# Patient Record
Sex: Male | Born: 1985 | Race: Black or African American | Hispanic: No | Marital: Married | Smoking: Former smoker
Health system: Southern US, Community
[De-identification: ages and names within clinical notes are randomized; demographics above are authoritative.]

## PROBLEM LIST (undated history)

## (undated) DIAGNOSIS — Z87898 Personal history of other specified conditions: Secondary | ICD-10-CM

## (undated) DIAGNOSIS — J309 Allergic rhinitis, unspecified: Secondary | ICD-10-CM

## (undated) DIAGNOSIS — Z9289 Personal history of other medical treatment: Secondary | ICD-10-CM

## (undated) DIAGNOSIS — I10 Essential (primary) hypertension: Secondary | ICD-10-CM

## (undated) DIAGNOSIS — E669 Obesity, unspecified: Secondary | ICD-10-CM

## (undated) DIAGNOSIS — Z8249 Family history of ischemic heart disease and other diseases of the circulatory system: Secondary | ICD-10-CM

## (undated) DIAGNOSIS — E790 Hyperuricemia without signs of inflammatory arthritis and tophaceous disease: Secondary | ICD-10-CM

## (undated) DIAGNOSIS — T7840XA Allergy, unspecified, initial encounter: Secondary | ICD-10-CM

## (undated) HISTORY — DX: Family history of ischemic heart disease and other diseases of the circulatory system: Z82.49

## (undated) HISTORY — PX: OTHER SURGICAL HISTORY: SHX169

## (undated) HISTORY — DX: Allergic rhinitis, unspecified: J30.9

## (undated) HISTORY — DX: Allergy, unspecified, initial encounter: T78.40XA

## (undated) HISTORY — DX: Essential (primary) hypertension: I10

## (undated) HISTORY — DX: Obesity, unspecified: E66.9

## (undated) HISTORY — DX: Personal history of other medical treatment: Z92.89

## (undated) HISTORY — DX: Personal history of other specified conditions: Z87.898

## (undated) HISTORY — DX: Hyperuricemia without signs of inflammatory arthritis and tophaceous disease: E79.0

---
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
## 2006-06-19 ENCOUNTER — Emergency Department (HOSPITAL_COMMUNITY): Admission: EM | Admit: 2006-06-19 | Discharge: 2006-06-19 | Payer: Self-pay | Admitting: Family Medicine

## 2008-01-04 ENCOUNTER — Emergency Department: Payer: Self-pay | Admitting: Emergency Medicine

## 2008-01-05 ENCOUNTER — Emergency Department: Payer: Self-pay | Admitting: Emergency Medicine

## 2008-03-18 ENCOUNTER — Emergency Department (HOSPITAL_COMMUNITY): Admission: EM | Admit: 2008-03-18 | Discharge: 2008-03-18 | Payer: Self-pay | Admitting: Emergency Medicine

## 2008-08-08 ENCOUNTER — Emergency Department (HOSPITAL_COMMUNITY): Admission: EM | Admit: 2008-08-08 | Discharge: 2008-08-08 | Payer: Self-pay | Admitting: Emergency Medicine

## 2009-07-10 ENCOUNTER — Emergency Department (HOSPITAL_COMMUNITY): Admission: EM | Admit: 2009-07-10 | Discharge: 2009-07-10 | Payer: Self-pay | Admitting: Family Medicine

## 2009-09-23 ENCOUNTER — Emergency Department (HOSPITAL_COMMUNITY): Admission: EM | Admit: 2009-09-23 | Discharge: 2009-09-23 | Payer: Self-pay | Admitting: Family Medicine

## 2010-02-27 ENCOUNTER — Inpatient Hospital Stay (INDEPENDENT_AMBULATORY_CARE_PROVIDER_SITE_OTHER)
Admission: RE | Admit: 2010-02-27 | Discharge: 2010-02-27 | Disposition: A | Discharge status: Home / Self Care | Payer: Self-pay | Source: Ambulatory Visit | Attending: Family Medicine | Admitting: Family Medicine

## 2010-02-27 ENCOUNTER — Ambulatory Visit (HOSPITAL_COMMUNITY)
Admission: RE | Admit: 2010-02-27 | Discharge: 2010-02-27 | Disposition: A | Discharge status: Home / Self Care | Payer: Self-pay | Source: Ambulatory Visit | Attending: Family Medicine | Admitting: Family Medicine

## 2010-02-27 DIAGNOSIS — M25539 Pain in unspecified wrist: Secondary | ICD-10-CM | POA: Insufficient documentation

## 2010-02-27 DIAGNOSIS — M799 Soft tissue disorder, unspecified: Secondary | ICD-10-CM

## 2011-02-06 ENCOUNTER — Emergency Department (HOSPITAL_COMMUNITY)
Admission: EM | Admit: 2011-02-06 | Discharge: 2011-02-06 | Disposition: A | Discharge status: Home / Self Care | Payer: Self-pay | Source: Home / Self Care | Attending: Family Medicine | Admitting: Family Medicine

## 2011-02-06 ENCOUNTER — Encounter (HOSPITAL_COMMUNITY): Payer: Self-pay

## 2011-02-06 ENCOUNTER — Emergency Department (INDEPENDENT_AMBULATORY_CARE_PROVIDER_SITE_OTHER): Payer: Self-pay

## 2011-02-06 DIAGNOSIS — IMO0002 Reserved for concepts with insufficient information to code with codable children: Secondary | ICD-10-CM

## 2011-02-06 DIAGNOSIS — S46912A Strain of unspecified muscle, fascia and tendon at shoulder and upper arm level, left arm, initial encounter: Secondary | ICD-10-CM

## 2011-02-06 DIAGNOSIS — M25519 Pain in unspecified shoulder: Secondary | ICD-10-CM

## 2011-02-06 MED ORDER — IBUPROFEN 800 MG PO TABS: 800.0000 mg | ORAL_TABLET | Freq: Three times a day (TID) | ORAL | Status: AC

## 2011-02-06 NOTE — ED Notes (Signed)
Pt states he lifted his 28 year old son above his head with his lt arm on Saturday and heard a pop.  C/o pain in lt shoulder that extends up into lt side of his neck.  States he can raise his lt arm but it is painful.

## 2011-02-06 NOTE — ED Provider Notes (Signed)
History     CSN: [id number]  Arrival date & time 02/06/11  1519   First MD Initiated Contact with Patient 02/06/11 1642      Chief Complaint  Patient presents with  . Shoulder Injury    (Consider location/radiation/quality/duration/timing/severity/associated sxs/prior treatment) Patient is a 26 y.o. male presenting with shoulder injury. The history is provided by the patient.  Shoulder Injury This is a new problem. The current episode started 2 days ago. The problem has not changed since onset.Exacerbated by: raisng  left arm up above shoulder. The symptoms are relieved by nothing.    History reviewed. No pertinent past medical history.  History reviewed. No pertinent past surgical history.  No family history on file.  History  Substance Use Topics  . Smoking status: Current Everyday Smoker  . Smokeless tobacco: Not on file  . Alcohol Use: Yes     occasional      Review of Systems  Constitutional: Negative.   Musculoskeletal: Positive for myalgias.  Skin: Negative.     Allergies  Review of patient's allergies indicates no known allergies.  Home Medications   Current Outpatient Rx  Name Route Sig Dispense Refill  . IBUPROFEN 800 MG PO TABS Oral Take 1 tablet (800 mg total) by mouth 3 (three) times daily. 30 tablet 0    BP 131/79  Pulse 74  Temp(Src) 98.4 F (36.9 C) (Oral)  Resp 16  SpO2 98%  Physical Exam  Nursing note and vitals reviewed. Constitutional: He is oriented to person, place, and time. He appears well-developed and well-nourished.  Musculoskeletal: He exhibits tenderness.       Arms: Neurological: He is alert and oriented to person, place, and time.  Skin: Skin is warm and dry.  Psychiatric: He has a normal mood and affect.    ED Course  Procedures (including critical care time)  Labs Reviewed - No data to display Dg Shoulder Left  02/06/2011  *RADIOLOGY REPORT*  Clinical Data: History of injury of shoulder.  Pain and soreness.   LEFT SHOULDER - 2+ VIEW  Comparison: None.  Findings: Alignment is normal.  Joint spaces are preserved.  No fracture or dislocation is evident.  No soft tissue lesions are seen.  No evidence of calcific tendonitis or calcific bursitis.  IMPRESSION: No shoulder abnormality evident.  Original Report Authenticated By: Crawford Givens, M.D.     1. Strain of shoulder, left       MDM  X-rays reviewed and report per radiologist.         Barkley Bruns, MD 02/06/11 [phone]

## 2011-06-06 ENCOUNTER — Encounter (HOSPITAL_COMMUNITY): Payer: Self-pay

## 2011-06-06 ENCOUNTER — Emergency Department (HOSPITAL_COMMUNITY)
Admission: EM | Admit: 2011-06-06 | Discharge: 2011-06-06 | Disposition: A | Discharge status: Home / Self Care | Payer: Self-pay | Attending: Emergency Medicine | Admitting: Emergency Medicine

## 2011-06-06 DIAGNOSIS — M25579 Pain in unspecified ankle and joints of unspecified foot: Secondary | ICD-10-CM | POA: Insufficient documentation

## 2011-06-06 MED ORDER — IBUPROFEN 800 MG PO TABS
800.0000 mg | ORAL_TABLET | Freq: Once | ORAL | Status: AC
  Administered 2011-06-06: 800 mg via ORAL
  Filled 2011-06-06: qty 1

## 2011-06-06 MED ORDER — ACETAMINOPHEN-CODEINE #3 300-30 MG PO TABS
1.0000 | ORAL_TABLET | Freq: Once | ORAL | Status: AC
Start: 2011-06-06 — End: 2011-06-06
  Administered 2011-06-06: 1 via ORAL
  Filled 2011-06-06: qty 1

## 2011-06-06 MED ORDER — ACETAMINOPHEN-CODEINE #3 300-30 MG PO TABS: 1.0000 | ORAL_TABLET | Freq: Four times a day (QID) | ORAL | Status: AC | PRN

## 2011-06-06 MED ORDER — IBUPROFEN 800 MG PO TABS: 800.0000 mg | ORAL_TABLET | Freq: Three times a day (TID) | ORAL | Status: AC

## 2011-06-06 NOTE — Progress Notes (Signed)
Orthopedic Tech Progress Note Patient Details:  Victor Jensen [birth year] [id number]  Other Ortho Devices Type of Ortho Device: Crutches   [name] 06/06/2011, 10:26 AM

## 2011-06-06 NOTE — Discharge Instructions (Signed)
Wear ankle brace for comfort and stabilization of ankle. Alternate between tylenol #3 and ibuprofen for pain but do not drive or operate machinery with Tylenol #3 use. Ice and elevate for additional pain relief. Followup with orthopedic specialist for recurrent ankle pain is very important for further evaluation and management however return to emergency department for any emergent changing or worsening of symptoms.  Ankle Pain Ankle pain is a common symptom. The bones, cartilage, tendons, and muscles of the ankle joint perform a lot of work each day. The ankle joint holds your body weight and allows you to move around. Ankle pain can occur on either side or back of 1 or both ankles. Ankle pain may be sharp and burning or dull and aching. There may be tenderness, stiffness, redness, or warmth around the ankle. The pain occurs more often when a person walks or puts pressure on the ankle. CAUSES  There are many reasons ankle pain can develop. It is important to work with your caregiver to identify the cause since many conditions can impact the bones, cartilage, muscles, and tendons. Causes for ankle pain include:  Injury, including a break (fracture), sprain, or strain often due to a fall, sports, or a high-impact activity.   Swelling (inflammation) of a tendon (tendonitis).   Achilles tendon rupture.   Ankle instability after repeated sprains and strains.   Poor foot alignment.   Pressure on a nerve (tarsal tunnel syndrome).   Arthritis in the ankle or the lining of the ankle.   Crystal formation in the ankle (gout or pseudogout).  DIAGNOSIS  A diagnosis is based on your medical history, your symptoms, results of your physical exam, and results of diagnostic tests. Diagnostic tests may include X-ray exams or a computerized magnetic scan (magnetic resonance imaging, MRI). TREATMENT  Treatment will depend on the cause of your ankle pain and may include:  Keeping pressure off the ankle and  limiting activities.   Using crutches or other walking support (a cane or brace).   Using rest, ice, compression, and elevation.   Participating in physical therapy or home exercises.   Wearing shoe inserts or special shoes.   Losing weight.   Taking medications to reduce pain or swelling or receiving an injection.   Undergoing surgery.  HOME CARE INSTRUCTIONS   Only take over-the-counter or prescription medicines for pain, discomfort, or fever as directed by your caregiver.   Put ice on the injured area.   Put ice in a plastic bag.   Place a towel between your skin and the bag.   Leave the ice on for 15 to 20 minutes at a time, 3 to 4 times a day.   Keep your leg raised (elevated) when possible to lessen swelling.   Avoid activities that cause ankle pain.   Follow specific exercises as directed by your caregiver.   Record how often you have ankle pain, the location of the pain, and what it feels like. This information may be helpful to you and your caregiver.   Ask your caregiver about returning to work or sports and whether you should drive.   Follow up with your caregiver for further examination, therapy, or testing as directed.  SEEK MEDICAL CARE IF:   Pain or swelling continues or worsens beyond 1 week.   You have an oral temperature above 102 F (38.9 C).   You are feeling unwell or have chills.   You are having an increasingly difficult time with walking.   You  have loss of sensation or other new symptoms.   You have questions or concerns.  MAKE SURE YOU:   Understand these instructions.   Will watch your condition.   Will get help right away if you are not doing well or get worse.  Document Released: 06/29/2009 Document Revised: 12/29/2010 Document Reviewed: 06/29/2009 [facility] Patient Information 2012 [place], [place].

## 2011-06-06 NOTE — ED Provider Notes (Signed)
History     CSN: [id number]  Arrival date & time 06/06/11  2440   First MD Initiated Contact with Patient 06/06/11 0957      Chief Complaint  Patient presents with  . Ankle Pain    (Consider location/radiation/quality/duration/timing/severity/associated sxs/prior treatment) HPI  Patient presents to emergency department complaining of a three-day history of gradual onset right ankle pain. Patient states that approximately 3 years ago he injured his right ankle and since then has had mild intermittent pain. Patient states on Sunday he was "up on my feet a lot doing lots of walking". Patient states that since then had gradual onset ankle pain without any known injury to ankle. He denies any redness, swelling, or skin changes. Patient states the pain is aggravated by walking and weightbearing and improved with rest. Patient has taken nothing for pain prior to arrival. Patient denies any breaks in skin. Patient has no further complaints. He has no known medical problems takes no medicines regular basis.  History reviewed. No pertinent past medical history.  History reviewed. No pertinent past surgical history.  History reviewed. No pertinent family history.  History  Substance Use Topics  . Smoking status: Current Everyday Smoker  . Smokeless tobacco: Not on file  . Alcohol Use: Yes     occasional      Review of Systems  Constitutional: Negative for fever and chills.  Musculoskeletal: Positive for arthralgias. Negative for myalgias, joint swelling and gait problem.  Skin: Negative for color change and wound.  Neurological: Negative for weakness and numbness.    Allergies  Review of patient's allergies indicates no known allergies.  Home Medications   Current Outpatient Rx  Name Route Sig Dispense Refill  . ACETAMINOPHEN-CODEINE #3 300-30 MG PO TABS Oral Take 1-2 tablets by mouth every 6 (six) hours as needed for pain. 15 tablet 0  . IBUPROFEN 800 MG PO TABS Oral Take 1  tablet (800 mg total) by mouth 3 (three) times daily. Take 800mg  by mouth at breakfast, lunch and dinner for the next 5 days 21 tablet 0    BP 148/88  Pulse 52  Temp 98.3 F (36.8 C)  Resp 20  SpO2 100%  Physical Exam  Nursing note and vitals reviewed. Constitutional: He is oriented to person, place, and time. He appears well-developed.  HENT:  Head: Normocephalic and atraumatic.  Eyes: Conjunctivae are normal.  Neck: Normal range of motion. Neck supple.  Cardiovascular: Normal rate.   Pulmonary/Chest: Effort normal.  Musculoskeletal: Normal range of motion. He exhibits tenderness. He exhibits no edema.       TTP of entire ankle but without skin changes, swelling, or erythema. Full range of motion of ankle but with pain in ankle. Good pedal pulses no tenderness to palpation of entire forefoot. Wiggling toes well. No tenderness to palpation or swelling of calf.  Neurological: He is alert and oriented to person, place, and time.  Skin: Skin is warm and dry. No rash noted. No erythema. No pallor.    ED Course  Procedures (including critical care time)  By mouth Tylenol #3 Diprivan. Patient given crutches and ASO.  Labs Reviewed - No data to display No results found.   1. Ankle pain       MDM  Right ankle, foot, and lower extremity neurovascular intact. No signs or symptoms of septic joint. Mild tenderness to palpation of entire ankle but no known injury. Patient has history of injury to foot and therefore question inflammatory changes of ankle.  Spoke at length with patient conservative management. He is agreeable to following up with orthopedic specialist for further evaluation of recurrent ankle pain however returning to emergency department for any changing or worsening symptoms.        Drucie Opitz, [place] 06/06/11 1024

## 2011-06-06 NOTE — ED Provider Notes (Signed)
Medical screening examination/treatment/procedure(s) were performed by non-physician practitioner and as supervising physician I was immediately available for consultation/collaboration.  Lexii Walsh, MD 06/06/11 1027 

## 2011-06-06 NOTE — ED Notes (Signed)
Pt reports right ankle pain starting Sunday

## 2013-08-12 ENCOUNTER — Encounter (HOSPITAL_COMMUNITY): Payer: Self-pay | Admitting: Emergency Medicine

## 2013-08-12 ENCOUNTER — Emergency Department (INDEPENDENT_AMBULATORY_CARE_PROVIDER_SITE_OTHER)
Admission: EM | Admit: 2013-08-12 | Discharge: 2013-08-12 | Disposition: A | Discharge status: Home / Self Care | Payer: Self-pay | Source: Home / Self Care

## 2013-08-12 DIAGNOSIS — M109 Gout, unspecified: Secondary | ICD-10-CM

## 2013-08-12 MED ORDER — COLCHICINE 0.6 MG PO TABS: ORAL_TABLET | ORAL | Status: DC

## 2013-08-12 MED ORDER — INDOMETHACIN 50 MG PO CAPS: 50.0000 mg | ORAL_CAPSULE | Freq: Two times a day (BID) | ORAL | Status: DC

## 2013-08-12 NOTE — Discharge Instructions (Signed)

## 2013-08-12 NOTE — ED Provider Notes (Signed)
CSN: [id number]     Arrival date & time 08/12/13  1216 History   First MD Initiated Contact with Patient 08/12/13 1315     Chief Complaint  Patient presents with  . Foot Pain   (Consider location/radiation/quality/duration/timing/severity/associated sxs/prior Treatment) HPI Comments: Pain R great toe with tenderness to light touch. Started 4-5 days ago. Similar episode 4 weeks ago and partially resolved. No trauma, injury.   History reviewed. No pertinent past medical history. History reviewed. No pertinent past surgical history. History reviewed. No pertinent family history. History  Substance Use Topics  . Smoking status: Current Every Day Smoker  . Smokeless tobacco: Not on file  . Alcohol Use: Yes     Comment: occasional    Review of Systems  Constitutional: Positive for activity change. Negative for fever and fatigue.  Respiratory: Negative for cough and shortness of breath.   Cardiovascular: Negative for chest pain.  Gastrointestinal: Negative.   Skin: Positive for color change.    Allergies  Review of patient's allergies indicates no known allergies.  Home Medications   Prior to Admission medications   Medication Sig Start Date End Date Taking? Authorizing Provider  colchicine 0.6 MG tablet 2 tabs po x 1, then one tab po 2 hour later, then 1 q d. Take with food. 08/12/13   Hayden Rasmussenavid Khamron Gellert, NP  indomethacin (INDOCIN) 50 MG capsule Take 1 capsule (50 mg total) by mouth 2 (two) times daily with a meal. 08/12/13   Hayden Rasmussenavid Jalene Lacko, NP   BP 137/94  Pulse 63  Temp(Src) 98.7 F (37.1 C) (Oral)  Resp 18  SpO2 96% Physical Exam  Nursing note and vitals reviewed. Constitutional: He is oriented to person, place, and time. He appears well-developed and well-nourished. No distress.  Neck: Normal range of motion. Neck supple.  Cardiovascular: Normal rate.   Pulmonary/Chest: Effort normal. No respiratory distress.  Musculoskeletal:  Minor, light redness to partial great toe with  moderate tenderness and pain with movement. No deformity or appreciable swelling. Foot without tenderness or swelling. Distal N/V and M/S intact.  Neurological: He is alert and oriented to person, place, and time.  Skin: Skin is warm and dry.  Psychiatric: He has a normal mood and affect.    ED Course  Procedures (including critical care time) Labs Review Labs Reviewed - No data to display  Imaging Review No results found.   MDM   1. 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  Indocin and cholchicine      Hayden Rasmussenavid Willetta York, NP 08/12/13 1337

## 2013-08-12 NOTE — ED Notes (Signed)
C/o right foot swelling States foot is painful States he did have hx a couple of weeks ago with the same sx States sx resolved but came back Ibuprofen was taking as tx

## 2013-08-12 NOTE — ED Provider Notes (Signed)
Medical screening examination/treatment/procedure(s) were performed by resident physician or non-physician practitioner and as supervising physician I was immediately available for consultation/collaboration.   KINDL,JAMES DOUGLAS MD.   James D Kindl, MD 08/12/13 1709 

## 2013-12-15 ENCOUNTER — Encounter (HOSPITAL_COMMUNITY): Payer: Self-pay | Admitting: Emergency Medicine

## 2013-12-15 ENCOUNTER — Emergency Department (HOSPITAL_COMMUNITY): Payer: No Typology Code available for payment source

## 2013-12-15 ENCOUNTER — Emergency Department (HOSPITAL_COMMUNITY)
Admission: EM | Admit: 2013-12-15 | Discharge: 2013-12-15 | Disposition: A | Discharge status: Home / Self Care | Payer: No Typology Code available for payment source | Attending: Emergency Medicine | Admitting: Emergency Medicine

## 2013-12-15 DIAGNOSIS — J069 Acute upper respiratory infection, unspecified: Secondary | ICD-10-CM | POA: Diagnosis not present

## 2013-12-15 DIAGNOSIS — B9789 Other viral agents as the cause of diseases classified elsewhere: Secondary | ICD-10-CM

## 2013-12-15 DIAGNOSIS — J209 Acute bronchitis, unspecified: Secondary | ICD-10-CM | POA: Diagnosis not present

## 2013-12-15 DIAGNOSIS — Z791 Long term (current) use of non-steroidal anti-inflammatories (NSAID): Secondary | ICD-10-CM | POA: Diagnosis not present

## 2013-12-15 DIAGNOSIS — R05 Cough: Secondary | ICD-10-CM | POA: Diagnosis present

## 2013-12-15 DIAGNOSIS — R059 Cough, unspecified: Secondary | ICD-10-CM

## 2013-12-15 DIAGNOSIS — Z720 Tobacco use: Secondary | ICD-10-CM | POA: Insufficient documentation

## 2013-12-15 MED ORDER — ACETAMINOPHEN-CODEINE #3 300-30 MG PO TABS: 1.0000 | ORAL_TABLET | Freq: Two times a day (BID) | ORAL | Status: DC | PRN

## 2013-12-15 MED ORDER — ALBUTEROL SULFATE HFA 108 (90 BASE) MCG/ACT IN AERS
2.0000 | INHALATION_SPRAY | Freq: Once | RESPIRATORY_TRACT | Status: AC
  Administered 2013-12-15: 2 via RESPIRATORY_TRACT
  Filled 2013-12-15: qty 6.7

## 2013-12-15 MED ORDER — IPRATROPIUM-ALBUTEROL 0.5-2.5 (3) MG/3ML IN SOLN
3.0000 mL | RESPIRATORY_TRACT | Status: DC
Start: 2013-12-15 — End: 2013-12-15
  Administered 2013-12-15: 3 mL via RESPIRATORY_TRACT
  Filled 2013-12-15: qty 3

## 2013-12-15 NOTE — ED Notes (Signed)
Pt placed in a gown, with BP cuff and pulse ox on. Pt also hooked up to the cardiac monitor

## 2013-12-15 NOTE — ED Notes (Signed)
Pt. reports persistent productive cough with blood tinged phlegm onset last week , denies fever or chills. Respirations unlabored.

## 2013-12-15 NOTE — ED Notes (Signed)
pT A/O X 4 WITH STEADY GAIT ON D/C.

## 2013-12-15 NOTE — Discharge Instructions (Signed)
Acute Bronchitis Victor Jensen, you were seen today for coughing and fevers. Your chest x-ray was negative for pneumonia. Your coughing is likely due to a viral cold. Take albuterol inhaler as needed for nighttime cough. He can also take medication as prescribed if necessary. Follow-up with your primary care physician within 3 days for continued management. If any of her symptoms worsen come back to the emergency department immediately for repeat evaluation. Thank you. Bronchitis is when the airways that extend from the windpipe into the lungs get red, puffy, and painful (inflamed). Bronchitis often causes thick spit (mucus) to develop. This leads to a cough. A cough is the most common symptom of bronchitis. In acute bronchitis, the condition usually begins suddenly and goes away over time (usually in 2 weeks). Smoking, allergies, and asthma can make bronchitis worse. Repeated episodes of bronchitis may cause more lung problems. HOME CARE  Rest.  Drink enough fluids to keep your pee (urine) clear or pale yellow (unless you need to limit fluids as told by your doctor).  Only take over-the-counter or prescription medicines as told by your doctor.  Avoid smoking and secondhand smoke. These can make bronchitis worse. If you are a smoker, think about using nicotine gum or skin patches. Quitting smoking will help your lungs heal faster.  Reduce the chance of getting bronchitis again by:  Washing your hands often.  Avoiding people with cold symptoms.  Trying not to touch your hands to your mouth, nose, or eyes.  Follow up with your doctor as told. GET HELP IF: Your symptoms do not improve after 1 week of treatment. Symptoms include:  Cough.  Fever.  Coughing up thick spit.  Body aches.  Chest congestion.  Chills.  Shortness of breath.  Sore throat. GET HELP RIGHT AWAY IF:   You have an increased fever.  You have chills.  You have severe shortness of breath.  You have bloody  thick spit (sputum).  You throw up (vomit) often.  You lose too much body fluid (dehydration).  You have a severe headache.  You faint. MAKE SURE YOU:   Understand these instructions.  Will watch your condition.  Will get help right away if you are not doing well or get worse. Document Released: 06/28/2007 Document Revised: 09/11/2012 Document Reviewed: 07/02/2012 [facility] Patient Information 2015 [place], [place]. This information is not intended to replace advice given to you by your health care provider. Make sure you discuss any questions you have with your health care provider. Upper Respiratory Infection, Adult An upper respiratory infection (URI) is also known as the common cold. It is often caused by a type of germ (virus). Colds are easily spread (contagious). You can pass it to others by kissing, coughing, sneezing, or drinking out of the same glass. Usually, you get better in 1 or 2 weeks.  HOME CARE   Only take medicine as told by your doctor.  Use a warm mist humidifier or breathe in steam from a hot shower.  Drink enough water and fluids to keep your pee (urine) clear or pale yellow.  Get plenty of rest.  Return to work when your temperature is back to normal or as told by your doctor. You may use a face mask and wash your hands to stop your cold from spreading. GET HELP RIGHT AWAY IF:   After the first few days, you feel you are getting worse.  You have questions about your medicine.  You have chills, shortness of breath, or brown or  red spit (mucus).  You have yellow or brown snot (nasal discharge) or pain in the face, especially when you bend forward.  You have a fever, puffy (swollen) neck, pain when you swallow, or white spots in the back of your throat.  You have a bad headache, ear pain, sinus pain, or chest pain.  You have a high-pitched whistling sound when you breathe in and out (wheezing).  You have a lasting cough or cough up blood.  You have  sore muscles or a stiff neck. MAKE SURE YOU:   Understand these instructions.  Will watch your condition.  Will get help right away if you are not doing well or get worse. Document Released: 06/28/2007 Document Revised: 04/03/2011 Document Reviewed: 04/16/2013 [facility] Patient Information 2015 [place] City[place], [place]. This information is not intended to replace advice given to you by your health care provider. Make sure you discuss any questions you have with your health care provider.  Emergency Department Resource Guide 1) Find a Doctor and Pay Out of Pocket Although you won't have to find out who is covered by your insurance plan, it is a good idea to ask around and get recommendations. You will then need to call the office and see if the doctor you have chosen will accept you as a new patient and what types of options they offer for patients who are self-pay. Some doctors offer discounts or will set up payment plans for their patients who do not have insurance, but you will need to ask so you aren't surprised when you get to your appointment.  2) Contact Your Local Health Department Not all health departments have doctors that can see patients for sick visits, but many do, so it is worth a call to see if yours does. If you don't know where your local health department is, you can check in your phone book. The CDC also has a tool to help you locate your state's health department, and many state websites also have listings of all of their local health departments.  3) Find a Walk-in Clinic If your illness is not likely to be very severe or complicated, you may want to try a walk in clinic. These are popping up all over the country in pharmacies, drugstores, and shopping centers. They're usually staffed by nurse practitioners or physician assistants that have been trained to treat common illnesses and complaints. They're usually fairly quick and inexpensive. However, if you have serious medical issues or  chronic medical problems, these are probably not your best option.  No Primary Care Doctor: - Call Health Connect at  [phone] - they can help you locate a primary care doctor that  accepts your insurance, provides certain services, etc. - Physician Referral Service- [phone]  Chronic Pain Problems: Organization         Address  Phone   Notes  [facility]  [phone] Patients need to be referred by their primary care doctor.   Medication Assistance: Organization         Address  Phone   Notes  [facility] [address] [place], [place] [phone] --Must be a resident of [facility] [place] -- Must have NO insurance coverage whatsoever (no Medicaid/ Medicare, etc.) -- The pt. MUST have a primary care doctor that directs their care regularly and follows them in the community   [facility]  [phone]   [employer]  [phone]    Agencies that  provide inexpensive medical care: Organization         Address  Phone   Notes  [facility]  [phone]   [facility]    [phone]   [facility] [address] [place], [place] [address] [phone]   [facility] [address] [place], [place] [phone]   Planned Parenthood    [phone]   [facility]    [phone]   [facility]  [address], [place] Phone:  [phone], Fax:  [phone] Hours of Operation:  9 am - 6 pm, M-F.  Also accepts Medicaid/Medicare and self-pay.  [facility]  [address], [place] Phone: [phone], Fax: [phone]. Hours of Operation:  8:30 am - 5:30 pm, M-F.  Also accepts Medicaid and self-pay.  [facility] [place] [address], [place] Phone: [phone]   [facility] [address] Natasha Bence [place], [place]  [phone], Ext. [phone] Mondays & Thursdays: 7-9 AM.  First 15 patients are seen on a first come, first serve basis.    Medicaid-accepting [facility] Providers:  Organization         Address  Phone   Notes  [facility] [address], [place] [phone] Also accepts self-pay patients.  [facility] [address] Laurell Josephs [place], [place]  [phone]   [facility] [address], [place] [phone]   [facility] [address] [place] [phone]   Renaye Rakers [address], [place]   [phone] Only accepts [place] Access [place] patients after they have their name applied to their card.   Self-Pay (no insurance) in [facility]:  Organization         Address  Phone   Notes  Sickle Cell Patients, [facility] [address] Rafael [place], [place] [phone]   [facility] [address] [place], [place] [phone]   [facility] [place]  [address], [place] [phone]   [facility]/Dr. Osei-Bonsu  [address] [place] or [address], [place] [phone] Phone number for both [place] and [place] locations is the same.  [facility]gent [facility] and [facility] [address] [place] [phone]   [facility] [address], [place] or [address] [phone]   [facility] [address] [place] [phone], phone; [phone], fax Sees patients 1st and 3rd Saturday of every month.  Must not qualify for public or private insurance (i.e. Medicaid, Medicare, [place] Health Choice, Veterans' Benefits)  Household income should be no more than 200% of the poverty level The clinic cannot treat you if you are pregnant or think you are pregnant  Sexually transmitted  diseases are not treated at the clinic.    Dental Care: Organization         Address  Phone  Notes  [facility] Department of [facility] [address] [place], [place] [phone] Accepts children up to age 26 who are enrolled in [place] or [place] Health Choice; pregnant women with a Medicaid card; and children who have applied for Medicaid or [place] Health  Choice, but were declined, whose parents can pay a reduced fee at time of service.  [facility] Department of [facility]  [address], [place] [phone] Accepts children up to age 72 who are enrolled in [place] or [place] Health Choice; pregnant women with a Medicaid card; and children who have applied for Medicaid or [place] Health Choice, but were declined, whose parents can pay a reduced fee at time of service.  [facility]  [address] [place], [place] [phone] Patients are seen by appointment only. Walk-ins are not accepted. [facility] will see patients 101 years of age and older. Monday - Tuesday (8am-5pm) Most Wednesdays (8:30-5pm) $30 per visit, cash only  [facility]  [address], [facility] [phone] Patients are seen by appointment only. Walk-ins are not accepted. [facility] will see patients 33 years of age and older. One Wednesday Evening (Monthly: Volunteer Based).  $30 per visit, cash only  [employer]  [phone] for adults; Children under age 56, call Graduate Pediatric Dentistry at [phone]. Children aged 43-14, please call [phone] to request a pediatric application.  Dental services are provided in all areas of dental care including fillings, crowns and bridges, complete and partial dentures, implants, gum treatment, root canals, and extractions. Preventive care is also provided. Treatment is provided to both adults and children. Patients are selected via a  lottery and there is often a waiting list.   [facility] [address], [place]  [phone] [email or web address]   [facility] [address] [place], [place] [phone], Ext. [phone] Second and Fourth Thursday of each month, opens at 6:30 AM; Clinic ends at 9 AM.  Patients are seen on a first-come first-served basis, and a limited number are seen during each clinic.   [facility]  [address] Ether Griffins [place], [place] [phone]   Eligibility Requirements You must have lived in [place], [place], or [place] counties for at least the last three months.   You cannot be eligible for state or federal sponsored [place], including [employer], [place], or [employer].   You generally cannot be eligible for healthcare insurance through your employer.    How to apply: Eligibility screenings are held every Tuesday and Wednesday afternoon from 1:00 pm until 4:00 pm. You do not need an appointment for the interview!  [facility] [address] [place], [place] [phone]   [facility] Health Department  [phone]   [facility] Health Department  [phone]   [facility] Health Department  [phone]    Behavioral Health Resources in the Community: Intensive Outpatient Programs Organization         Address  Phone  Notes  [facility] [address], [place], [place] [phone]   [facility] Outpatient [address] [place], [place] [phone]   [facility] [address], [place], [place]  [phone]   [facility] Mental Health [address],  [place], [place] [phone] or [phone]   Substance Abuse Resources Organization         Address  Phone  Notes  [facility]  [phone]   [facility]  [phone]   [facility]  [phone]   Floydene Flock  [phone]   Residential &  Outpatient Substance Abuse Program  [phone]  Psychological Services Organization         Address  Phone  Notes  [facility] Behavioral Health  [phone]   [facility]  [phone]   [facility] Mental Health [phone] [address], [place] [phone] or [phone]    Mobile Crisis Teams Organization         Address  Phone  Notes  [facility], Mobile Crisis Care Unit  [phone]   [facility]  [address] [place], [place] [phone]   Doristine Locks [address] [place] [phone]    Self-Help/Support Groups Organization         Address  Phone             Notes  [facility] [place] - variety of support groups  [phone]- [phone] Call for more information  Narcotics Anonymous (NA), [facility] [address], [employer] [place]  2 meetings at this location   Statistician         Address  Phone  Notes  [facility] [address],    [place]  [phone]   [facility]  [address], [place] [address], [place], [place] [phone]   [facility] [address] [place], [place] [phone] Admissions: 8am-3pm M-F  [facility] [address]    [place], [place] [phone]   [facility] [address] [place], [place], [place] [phone]   [facility] [address]  [place], [place] [phone]   [facility] - Intensive Outpatient [address] Laurell Josephs [address], [place], [place] [phone]   [facility] ([facility] [address] [place].,  [place], [place] [phone] or [phone]   [facility] ([facility]) [address] [place], [place] [phone] Accepts Medicaid  [facility] [address],  [place] [phone] Substance Abuse/Addiction Treatment   [facility] Organization          Address  Phone  Notes  [facility]  [phone]   Angie Fava, PhD [address] Ervin Knack [place], [place]   [phone] or [phone]   [facility] Behavioral   [address] [place], [place] [phone]   [facility] [address], [place], [place] [phone] Insurance/Medicaid/sponsorship through [facility] and Families [address]                                    [place], [place] [phone] Therapy/tele-psych/case  [facility] [address], [place] [phone]    Dr. Lolly Mustache  [phone]   [facility] [place]  United [employer] [facility] Medicine Dept. 1) [address], [place] 2) [address] [place] 3)  [address], [place] [phone]  [phone]   [facility] Child Abuse Hotline 205-685-9643 or [phone] (After Hours)

## 2013-12-15 NOTE — ED Provider Notes (Signed)
CSN: [id number]     Arrival date & time 12/15/13  0037 History  This chart was scribed for Victor Jensen Kamau Weatherall, MD by Evon Slackerrance Branch, ED Scribe. This patient was seen in room A12C/A12C and the patient's care was started at 12:58 AM.    Chief Complaint  Patient presents with  . Cough   Patient is a 28 y.o. male presenting with cough. The history is provided by the patient. No language interpreter was used.  Cough Cough characteristics:  Productive Sputum characteristics:  Bloody and yellow Severity:  Moderate Onset quality:  Gradual Duration:  5 days Timing:  Intermittent Progression:  Improving Chronicity:  New Smoker: no   Context: sick contacts   Relieved by:  Nothing Worsened by:  Lying down Ineffective treatments:  Cough suppressants Associated symptoms: fever   Associated symptoms: no chills and no shortness of breath    HPI Comments: Victor MundaJonathan D Jensen is a 28 y.o. male who presents to the Emergency Department complaining of persistent gradually improving productive cough onset 5 days ago. He states that he has associated subjective fever. He describes the sputum produced as yellow and bloody 1 day ago. He state that its worse at night when lying down. His sputum has now cleared up and improved since onset.  He states that he has recently been around his kids and wife who have similar symptoms. There is a viral infection running through his house.  He states that he has tried Q- tussin, dayquil and throat lozenges with no relief. He has a history of childhood asthma but denies cigarette use.   Denies chills or difficulty breathing. He has no chest pain.   History reviewed. No pertinent past medical history. History reviewed. No pertinent past surgical history. No family history on file. History  Substance Use Topics  . Smoking status: Current Every Day Smoker  . Smokeless tobacco: Not on file  . Alcohol Use: Yes     Comment: occasional    Review of Systems  Constitutional:  Positive for fever. Negative for chills.  Respiratory: Positive for cough. Negative for shortness of breath.   All other systems reviewed and are negative.   Allergies  Review of patient's allergies indicates no known allergies.  Home Medications   Prior to Admission medications   Medication Sig Start Date End Date Taking? Authorizing Provider  colchicine 0.6 MG tablet 2 tabs po x 1, then one tab po 2 hour later, then 1 q d. Take with food. 08/12/13   Hayden Rasmussenavid Mabe, NP  indomethacin (INDOCIN) 50 MG capsule Take 1 capsule (50 mg total) by mouth 2 (two) times daily with a meal. 08/12/13   Hayden Rasmussenavid Mabe, NP   Triage Vitals: BP 152/91 mmHg  Pulse 74  Temp(Src) 98.4 F (36.9 C) (Oral)  Resp 18  Wt 272 lb (123.378 kg)  SpO2 93%  Physical Exam  Constitutional: He is oriented to person, place, and time. Vital signs are normal. He appears well-developed and well-nourished.  Non-toxic appearance. He does not appear ill. No distress.  HENT:  Head: Normocephalic and atraumatic.  Nose: Nose normal.  Mouth/Throat: Oropharynx is clear and moist. No oropharyngeal exudate.  Eyes: Conjunctivae and EOM are normal. Pupils are equal, round, and reactive to light. No scleral icterus.  Neck: Normal range of motion. Neck supple. No tracheal deviation, no edema, no erythema and normal range of motion present. No thyroid mass and no thyromegaly present.  Cardiovascular: Normal rate, regular rhythm, S1 normal, S2 normal, normal heart sounds, intact  distal pulses and normal pulses.  Exam reveals no gallop and no friction rub.   No murmur heard. Pulses:      Radial pulses are 2+ on the right side, and 2+ on the left side.       Dorsalis pedis pulses are 2+ on the right side, and 2+ on the left side.  Pulmonary/Chest: Effort normal and breath sounds normal. No respiratory distress. He has no wheezes. He has no rhonchi. He has no rales.  Cough was produced with fast expiratory flow.  Abdominal: Soft. Normal  appearance and bowel sounds are normal. He exhibits no distension, no ascites and no mass. There is no hepatosplenomegaly. There is no tenderness. There is no rebound, no guarding and no CVA tenderness.  Musculoskeletal: Normal range of motion. He exhibits no edema or tenderness.  Lymphadenopathy:    He has no cervical adenopathy.  Neurological: He is alert and oriented to person, place, and time. He has normal strength. No cranial nerve deficit or sensory deficit. He exhibits normal muscle tone. GCS eye subscore is 4. GCS verbal subscore is 5. GCS motor subscore is 6.  Skin: Skin is warm, dry and intact. No petechiae and no rash noted. He is not diaphoretic. No erythema. No pallor.  Psychiatric: He has a normal mood and affect. His behavior is normal. Judgment normal.  Nursing note and vitals reviewed.   ED Course  Procedures (including critical care time) DIAGNOSTIC STUDIES: Oxygen Saturation is 93% on RA, adequate by my interpretation.    COORDINATION OF CARE: 1:03 AM-Discussed treatment plan with pt at bedside and pt agreed to plan.     Labs Review Labs Reviewed - No data to display  Imaging Review Dg Chest 2 View  12/15/2013   CLINICAL DATA:  Cough and shortness of breath  EXAM: CHEST - 2 VIEW  COMPARISON:  None.  FINDINGS: The heart size and mediastinal contours are within normal limits. Both lungs are clear. The visualized skeletal structures are unremarkable.  IMPRESSION: No active disease.   Electronically Signed   By: Signa Kellaylor  Stroud M.D.   On: 12/15/2013 02:01     EKG Interpretation None      MDM   Final diagnoses:  Cough   patient since emergency department for productive cough and subjective fevers. Will obtain chest x-ray to evaluate for pneumonia. Likely bronchospasm due to physical exam findings. He'll be given a breathing treatment and sent home with albuterol inhaler for supportive care. Primary care follow-up was advised within 3 days.  Chest x-ray is  negative for pneumonia.  His vital signs remain within his normal limits and is safe for discharge.      I personally performed the services described in this documentation, which was scribed in my presence. The recorded information has been reviewed and is accurate.      Victor Jensen Nikira Kushnir, MD 12/15/13 [phone]

## 2014-07-07 ENCOUNTER — Ambulatory Visit (INDEPENDENT_AMBULATORY_CARE_PROVIDER_SITE_OTHER): Payer: 59 | Admitting: Family Medicine

## 2014-07-07 ENCOUNTER — Ambulatory Visit (INDEPENDENT_AMBULATORY_CARE_PROVIDER_SITE_OTHER): Payer: 59

## 2014-07-07 VITALS — BP 140/82 | HR 65 | Temp 98.9°F | Resp 20 | Ht 72.0 in | Wt 287.5 lb

## 2014-07-07 DIAGNOSIS — M25571 Pain in right ankle and joints of right foot: Secondary | ICD-10-CM

## 2014-07-07 DIAGNOSIS — M1000 Idiopathic gout, unspecified site: Secondary | ICD-10-CM | POA: Diagnosis not present

## 2014-07-07 LAB — COMPLETE METABOLIC PANEL WITH GFR
ALT: 18 U/L (ref 0–53)
AST: 23 U/L (ref 0–37)
Albumin: 4.6 g/dL (ref 3.5–5.2)
Alkaline Phosphatase: 72 U/L (ref 39–117)
BUN: 8 mg/dL (ref 6–23)
CO2: 28 mEq/L (ref 19–32)
Calcium: 9.8 mg/dL (ref 8.4–10.5)
Chloride: 103 mEq/L (ref 96–112)
Creat: 1.19 mg/dL (ref 0.50–1.35)
GFR, Est African American: >89 mL/min
GFR, Est Non African American: 81 mL/min
Glucose, Bld: 88 mg/dL (ref 70–99)
Potassium: 4.2 mEq/L (ref 3.5–5.3)
Sodium: 139 mEq/L (ref 135–145)
Total Bilirubin: 0.6 mg/dL (ref 0.2–1.2)
Total Protein: 8 g/dL (ref 6.0–8.3)

## 2014-07-07 LAB — URIC ACID: Uric Acid, Serum: 7.9 mg/dL — ABNORMAL HIGH (ref 4.0–7.8)

## 2014-07-07 MED ORDER — PREDNISONE 20 MG PO TABS: ORAL_TABLET | ORAL | Status: DC

## 2014-07-07 NOTE — Progress Notes (Signed)
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 He's married and has 2 children. He comes in with right foot pain. Her graft his pain began several days ago after striking it on a pallet. He initially did not have much pain but by the evening it started to feel more more comfortable. He actually had some brandy on Saturday night she didn't help matters much.  Patient's had a history of gouty attack in his right great toe. He's not on any toe pain now. Rather, the pain is on the lateral aspect of his foot just below the lateral malleolus. There is mild swelling there as well.  Objective: Alert and articulate gentleman in no acute distress BP 140/82 mmHg  Pulse 65  Temp(Src) 98.9 F (37.2 C) (Oral)  Resp 20  Ht 6' (1.829 m)  Wt 287 lb 8 oz (130.409 kg)  BMI 38.98 kg/m2  SpO2 94% Inspection of the right foot reveals mild swelling, redness, and tenderness just below the right lateral malleolus. There is no rash there.  [facility] reading (PRIMARY) by  Dr. Milus Glazier:  Neg right ankle    Patient ID: Victor Jensen MRN: [id number], DOB: April 21, 1985, 29 y.o. Date of Encounter: 07/07/2014, 1:36 PM  Primary Physician: Default, Provider, MD  Chief Complaint: Diabetes follow up  HPI: 29 y.o. year old male with history below presents for follow up of diabetes mellitus. Doing well. No issues or complaints. Taking medications daily without adverse effects. No polydipsia, polyphagia, polyuria, or nocturia.  Blood sugars at home: Diet consists of: Exercising regularly. Last A1C:   Eye MD: DDS: Influenza vaccine: Pneumococcal vaccine: Last CPE:  Past Medical History  Diagnosis Date  . Allergy      Home Meds: Prior to Admission medications   Medication Sig Start Date End Date Taking? Authorizing Provider  ibuprofen (ADVIL,MOTRIN) 200 MG tablet Take 200 mg by mouth every 6 (six) hours as needed.   Yes Historical Provider, MD  predniSONE (DELTASONE) 20 MG tablet  Two daily with food 07/07/14   Elvina Sidle, MD    Allergies: No Known Allergies  History   Social History  . Marital Status: Married    Spouse Name: N/A  . Number of Children: N/A  . Years of Education: N/A   Occupational History  . Not on file.   Social History Main Topics  . Smoking status: Never Smoker   . Smokeless tobacco: Never Used  . Alcohol Use: 0.0 oz/week    0 Standard drinks or equivalent per week     Comment: occasional  . Drug Use: No  . Sexual Activity: Not on file   Other Topics Concern  . Not on file   Social History Narrative     No results found for: HGBA1C  Review of Systems: Constitutional: negative for chills, fever, night sweats, weight changes, or fatigue  HEENT: negative for vision changes, hearing loss, congestion, rhinorrhea, or epistaxis Cardiovascular: negative for chest pain, palpitations, diaphoresis, DOE, orthopnea, or edema Respiratory: negative for hemoptysis, wheezing, shortness of breath, dyspnea, or cough Abdominal: negative for abdominal pain, nausea, vomiting, diarrhea, or constipation Dermatological: negative for rash, erythema, or wounds Neurologic: negative for headache, dizziness, or syncope Renal:  Negative for polyuria, polydipsia, or dysuria All other systems reviewed and are otherwise negative with the exception to those above and in the HPI.   Physical Exam: Blood pressure 140/82, pulse 65, temperature 98.9 F (37.2 C), temperature source Oral, resp. rate 20, height 6' (1.829 m),  weight 287 lb 8 oz (130.409 kg), SpO2 94 %., Body mass index is 38.98 kg/(m^2). Wt Readings from Last 3 Encounters:  07/07/14 287 lb 8 oz (130.409 kg)  12/15/13 272 lb (123.378 kg)   BP Readings from Last 3 Encounters:  07/07/14 140/82  12/15/13 137/71  08/12/13 137/94   General: Well developed, well nourished, in no acute distress. Head: Normocephalic, atraumatic, eyes without discharge, sclera non-icteric, nares are without  discharge. Bilateral auditory canals clear, TM's are without perforation, pearly grey and translucent with reflective cone of light bilaterally. Oral cavity moist, posterior pharynx without exudate, erythema, peritonsillar abscess, or post nasal drip.  Neck: Supple. No thyromegaly. Full ROM. No lymphadenopathy. Lungs: Clear bilaterally to auscultation without wheezes, rales, or rhonchi. Breathing is unlabored. Heart: RRR with S1 S2. No murmurs, rubs, or gallops appreciated. Abdomen: Soft, non-tender, non-distended with normoactive bowel sounds. No hepatosplenomegaly. No rebound/guarding. No obvious abdominal masses. Msk:  Strength and tone normal for age. Extremities/Skin: Warm and dry. No clubbing or cyanosis. No edema. No rashes, wounds, or suspicious lesions..  Neuro: Alert and oriented X 3. Moves all extremities spontaneously. Gait is normal. CNII-XII grossly in tact. Psych:  Responds to questions appropriately with a normal affect.   Labs:   ASSESSMENT AND PLAN:  29 y.o. year old male with  -   ICD-9-CM ICD-10-CM   1. Right ankle pain 719.47 M25.571 DG Ankle Complete Right     DG Ankle Complete Right     Amb ref to Medical Nutrition Therapy-MNT     COMPLETE METABOLIC PANEL WITH GFR     Uric acid     predniSONE (DELTASONE) 20 MG tablet  2. Morbid obesity 278.01 E66.01 Amb ref to Medical Nutrition Therapy-MNT     COMPLETE METABOLIC PANEL WITH GFR     Uric acid     predniSONE (DELTASONE) 20 MG tablet  3. Acute idiopathic gout, unspecified site 274.01 M10.00 Amb ref to Medical Nutrition Therapy-MNT     COMPLETE METABOLIC PANEL WITH GFR     Uric acid     predniSONE (DELTASONE) 20 MG tablet    Signed, Elvina Sidle, MD 07/07/2014 1:36 PM  g .

## 2014-07-07 NOTE — Patient Instructions (Signed)

## 2014-07-08 MED ORDER — INDOMETHACIN 50 MG PO CAPS: 50.0000 mg | ORAL_CAPSULE | Freq: Two times a day (BID) | ORAL | Status: DC

## 2014-07-08 NOTE — Addendum Note (Signed)
Addended by: Elvina Sidle on: 07/08/2014 03:56 PM   Modules accepted: Orders

## 2014-08-28 ENCOUNTER — Ambulatory Visit: Payer: No Typology Code available for payment source | Admitting: Internal Medicine

## 2014-09-07 ENCOUNTER — Encounter: Payer: 59 | Attending: Family Medicine | Admitting: Skilled Nursing Facility1

## 2014-09-07 ENCOUNTER — Encounter: Payer: Self-pay | Admitting: Skilled Nursing Facility1

## 2014-09-07 VITALS — Ht 72.0 in | Wt 285.0 lb

## 2014-09-07 DIAGNOSIS — E669 Obesity, unspecified: Secondary | ICD-10-CM | POA: Diagnosis not present

## 2014-09-07 DIAGNOSIS — Z713 Dietary counseling and surveillance: Secondary | ICD-10-CM | POA: Insufficient documentation

## 2014-09-07 DIAGNOSIS — Z6838 Body mass index (BMI) 38.0-38.9, adult: Secondary | ICD-10-CM | POA: Insufficient documentation

## 2014-09-07 NOTE — Progress Notes (Signed)
  Medical Nutrition Therapy:  Appt start time: 0800 end time:  0900.   Assessment:  Primary concerns today: referred for obesity. Pt states he wants some education on healthy foods and to get into better shape and lose weight. Diet hx to lose weight: exercising and Cut out certain foods. Pt states over the past 6-8 years he has increased his calorically dense foods consumption. Pt states his wife cooks and does not eat vegetables. Pt states his Kids not do not eat healthy because his wife fights against what he wants and he wants them to eat healthy, he himself also wants to eat better but his wife makes it very difficult. Pt states he Works 3rd shift-sleeps in 2 hour intervals-gets off at 7:30am-sleeps at 9am-up at 3-4pm-first time eats at 6:30. 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 Preferred Learning Style:   No preference indicated   Learning Readiness:   Ready  MEDICATIONS: none   DIETARY INTAKE:  Usual eating pattern includes 3 meals and 0 snacks per day.  Everyday foods include fast food.  Avoided foods include milk (pt thought it was unhealthy).    24-hr recall:  B ( AM):  Same thing 10 years-cheese burgers, fried chicken, lasagna, mac N' cheese, chicken alfredo, papa johns, bojangles, chic fil'a, hamburger helper Snk ( AM): none L ( PM): shake: green leafy, fruit, and boost: seeds, berries, maca powder, pumpkin seeds Snk ( PM): none D ( PM): taco bell Snk ( PM): none Beverages: water, herbal tea  Usual physical activity: P90X for 60-90 minutes 6 days a week  Estimated energy needs: 2200 calories 248 g carbohydrates 165 g protein 61 g fat  Progress Towards Goal(s):  In progress.   Nutritional Diagnosis:  [place]-3.3 Overweight/obesity As related to over consumption of calorically dense foods.  As evidenced by pt report, 24 hr recall, and BMI 38.65.    Intervention:  Nutrition counseling for obesity.  Dietitian educated the pt on the importance of carbohydrates, varied/balanced diet, eating your meals from home, and proper sleep.  Goals: -Aim for 6-8 hours of restful sleep-Get black out curtains, paint your new room a deeper color like burgundy, and try a sleep app on your phone -[email or web address].[email or web address] -Eat three meals a day and 2-3 snacks every day -Eat carbohydrates, lean protein, and vegetables -Plan for the next three days or week on a day you do not have much to do -Try adding low fat 1% milk to your shake  Teaching Method Utilized:  Visual Auditory  Handouts given during visit include:  Low sodium seasoning options  MyPlate  Snack sheet  Barriers to learning/adherence to lifestyle change: unsupportive spouse   Demonstrated degree of understanding via:  Teach Back   Monitoring/Evaluation:  Dietary intake and body weight prn.

## 2014-09-07 NOTE — Patient Instructions (Signed)
-  Aim for 6-8 hours of restful sleep-Get black out curtains, paint your new room a deeper color like burgundy, and try a sleep app on your phone -[email or web address].[email or web address] -Eat three meals a day and 2-3 snacks every day -Eat carbohydrates, lean protein, and vegetables -Plan for the next three days or week on a day you do not have much to do -Try adding low fat 1% milk to your shake

## 2014-10-23 ENCOUNTER — Ambulatory Visit: Payer: No Typology Code available for payment source | Admitting: Internal Medicine

## 2014-11-14 ENCOUNTER — Emergency Department (HOSPITAL_COMMUNITY): Payer: 59

## 2014-11-14 ENCOUNTER — Encounter (HOSPITAL_COMMUNITY): Payer: Self-pay | Admitting: Nurse Practitioner

## 2014-11-14 ENCOUNTER — Other Ambulatory Visit: Payer: Self-pay | Admitting: Cardiology

## 2014-11-14 ENCOUNTER — Emergency Department (HOSPITAL_COMMUNITY)
Admission: EM | Admit: 2014-11-14 | Discharge: 2014-11-14 | Disposition: A | Discharge status: Home / Self Care | Payer: 59 | Attending: Emergency Medicine | Admitting: Emergency Medicine

## 2014-11-14 ENCOUNTER — Ambulatory Visit (INDEPENDENT_AMBULATORY_CARE_PROVIDER_SITE_OTHER): Payer: 59 | Admitting: Emergency Medicine

## 2014-11-14 VITALS — BP 118/76 | HR 71 | Temp 98.2°F | Resp 18 | Ht 72.0 in | Wt 280.8 lb

## 2014-11-14 DIAGNOSIS — R9431 Abnormal electrocardiogram [ECG] [EKG]: Secondary | ICD-10-CM

## 2014-11-14 DIAGNOSIS — R0602 Shortness of breath: Secondary | ICD-10-CM | POA: Diagnosis not present

## 2014-11-14 DIAGNOSIS — F1210 Cannabis abuse, uncomplicated: Secondary | ICD-10-CM | POA: Insufficient documentation

## 2014-11-14 DIAGNOSIS — R531 Weakness: Secondary | ICD-10-CM | POA: Diagnosis present

## 2014-11-14 DIAGNOSIS — R0789 Other chest pain: Secondary | ICD-10-CM

## 2014-11-14 DIAGNOSIS — R110 Nausea: Secondary | ICD-10-CM | POA: Diagnosis not present

## 2014-11-14 LAB — POCT CBC
GRANULOCYTE PERCENT: 70.2 % (ref 37–80)
HEMATOCRIT: 44.4 % (ref 43.5–53.7)
Hemoglobin: 14.8 g/dL (ref 14.1–18.1)
LYMPH, POC: 2.1 (ref 0.6–3.4)
MCH: 25.9 pg — AB (ref 27–31.2)
MCHC: 33.3 g/dL (ref 31.8–35.4)
MCV: 77.6 fL — AB (ref 80–97)
MID (cbc): 0.2 (ref 0–0.9)
MPV: 7.2 fL (ref 0–99.8)
POC GRANULOCYTE: 5.5 (ref 2–6.9)
POC LYMPH PERCENT: 27.2 %L (ref 10–50)
POC MID %: 2.6 % (ref 0–12)
Platelet Count, POC: 233 10*3/uL (ref 142–424)
RBC: 5.71 M/uL (ref 4.69–6.13)
RDW, POC: 14.2 %
WBC: 7.8 10*3/uL (ref 4.6–10.2)

## 2014-11-14 LAB — RAPID URINE DRUG SCREEN, HOSP PERFORMED
AMPHETAMINES: NOT DETECTED
BARBITURATES: NOT DETECTED
Benzodiazepines: NOT DETECTED
Cocaine: NOT DETECTED
Opiates: NOT DETECTED
TETRAHYDROCANNABINOL: POSITIVE — AB

## 2014-11-14 LAB — COMPREHENSIVE METABOLIC PANEL
ALBUMIN: 4.5 g/dL (ref 3.6–5.1)
ALT: 22 U/L (ref 9–46)
AST: 29 U/L (ref 10–40)
Alkaline Phosphatase: 64 U/L (ref 40–115)
BUN: 9 mg/dL (ref 7–25)
CHLORIDE: 103 mmol/L (ref 98–110)
CO2: 28 mmol/L (ref 20–31)
CREATININE: 1.31 mg/dL (ref 0.60–1.35)
Calcium: 10.1 mg/dL (ref 8.6–10.3)
Glucose, Bld: 92 mg/dL (ref 65–99)
POTASSIUM: 4.9 mmol/L (ref 3.5–5.3)
SODIUM: 137 mmol/L (ref 135–146)
TOTAL PROTEIN: 7.8 g/dL (ref 6.1–8.1)
Total Bilirubin: 0.7 mg/dL (ref 0.2–1.2)

## 2014-11-14 LAB — BASIC METABOLIC PANEL
Anion gap: 9 (ref 5–15)
BUN: 7 mg/dL (ref 6–20)
CALCIUM: 9.2 mg/dL (ref 8.9–10.3)
CHLORIDE: 101 mmol/L (ref 101–111)
CO2: 25 mmol/L (ref 22–32)
CREATININE: 1.22 mg/dL (ref 0.61–1.24)
GFR calc non Af Amer: >60 mL/min (ref >60)
Glucose, Bld: 102 mg/dL — ABNORMAL HIGH (ref 65–99)
Potassium: 4 mmol/L (ref 3.5–5.1)
Sodium: 135 mmol/L (ref 135–145)

## 2014-11-14 LAB — CBC
HCT: 43.2 % (ref 39.0–52.0)
Hemoglobin: 14.7 g/dL (ref 13.0–17.0)
MCH: 26.2 pg (ref 26.0–34.0)
MCHC: 34 g/dL (ref 30.0–36.0)
MCV: 77 fL — AB (ref 78.0–100.0)
PLATELETS: 207 10*3/uL (ref 150–400)
RBC: 5.61 MIL/uL (ref 4.22–5.81)
RDW: 13.5 % (ref 11.5–15.5)
WBC: 9.1 10*3/uL (ref 4.0–10.5)

## 2014-11-14 LAB — I-STAT TROPONIN, ED
TROPONIN I, POC: 0 ng/mL (ref 0.00–0.08)
Troponin i, poc: 0 ng/mL (ref 0.00–0.08)

## 2014-11-14 LAB — AMYLASE: AMYLASE: 45 U/L (ref 0–105)

## 2014-11-14 LAB — GLUCOSE, POCT (MANUAL RESULT ENTRY): POC GLUCOSE: 98 mg/dL (ref 70–99)

## 2014-11-14 LAB — LIPASE: LIPASE: 22 U/L (ref 7–60)

## 2014-11-14 MED ORDER — ONDANSETRON 4 MG PO TBDP: 4.0000 mg | ORAL_TABLET | Freq: Once | ORAL | Status: DC

## 2014-11-14 MED ORDER — ONDANSETRON 4 MG PO TBDP
8.0000 mg | ORAL_TABLET | Freq: Once | ORAL | Status: AC
  Administered 2014-11-14: 8 mg via ORAL

## 2014-11-14 NOTE — Discharge Instructions (Signed)
PLEASE RETURN TO THE ER AS SOON AS POSSIBLE IF YOU HAVE ANY SYMPTOMS THAT REOCCUR, CHEST PAIN, NAUSEA, VOMITING, SWEATING, WEAKNESS, FATIGUE, CONFUSION OR ANY NEW SYMPTOMS.

## 2014-11-14 NOTE — Progress Notes (Signed)
Subjective:  Patient ID: Victor Jensen, male    DOB: 1985/06/27  Age: 29 y.o. MRN: [id number]  CC: Shortness of Breath; Chills; Nausea; Dizziness; and Numbness   HPI Victor Jensen presents  with a history of a choking sensation associated with shortness of breath last night while watching TV. He said that he had had a sweat shirt on at the time and took it off and didn't improve. He did have a little chest tightness associated with this. He felt dizzy and chills so he went to bed. When he woke up his wife was home and he doesn't remember anything in the interval. He has no history of high blood pressure diabetes, high cholesterol. He does have a history of premature cardiovascular death in his father at age 26  His nausea improved in the office with administration of Zofran. He's had no diarrhea. And no real vomiting either  History Victor Jensen has a past medical history of Allergy.   He has no past surgical history on file.   His  family history includes Cancer in his other; Diabetes in his father, maternal grandfather, and paternal grandfather.  He   reports that he has never smoked. He has never used smokeless tobacco. He reports that he drinks alcohol. He reports that he does not use illicit drugs.  Outpatient Prescriptions Prior to Visit  Medication Sig Dispense Refill  . ibuprofen (ADVIL,MOTRIN) 200 MG tablet Take 200 mg by mouth every 6 (six) hours as needed.    . indomethacin (INDOCIN) 50 MG capsule Take 1 capsule (50 mg total) by mouth 2 (two) times daily with a meal. (Patient not taking: Reported on 09/07/2014) 30 capsule 1  . predniSONE (DELTASONE) 20 MG tablet Two daily with food (Patient not taking: Reported on 09/07/2014) 10 tablet 0   No facility-administered medications prior to visit.    Social History   Social History  . Marital Status: Married    Spouse Name: N/A  . Number of Children: N/A  . Years of Education: N/A   Social History Main Topics  .  Smoking status: Never Smoker   . Smokeless tobacco: Never Used  . Alcohol Use: 0.0 oz/week    0 Standard drinks or equivalent per week     Comment: occasional  . Drug Use: No  . Sexual Activity: Not Asked   Other Topics Concern  . None   Social History Narrative     Review of Systems  Constitutional: Positive for chills. Negative for fever and appetite change.  HENT: Negative for congestion, ear pain, postnasal drip, sinus pressure and sore throat.   Eyes: Negative for pain and redness.  Respiratory: Positive for shortness of breath. Negative for cough and wheezing.   Cardiovascular: Negative for leg swelling.  Gastrointestinal: Positive for nausea. Negative for vomiting, abdominal pain, diarrhea, constipation and blood in stool.  Endocrine: Negative for polyuria.  Genitourinary: Negative for dysuria, urgency, frequency and flank pain.  Musculoskeletal: Negative for gait problem.  Skin: Negative for rash.  Neurological: Negative for weakness and headaches.  Psychiatric/Behavioral: Negative for confusion and decreased concentration. The patient is not nervous/anxious.     Objective:  BP 118/76 mmHg  Pulse 71  Temp(Src) 98.2 F (36.8 C) (Oral)  Resp 18  Ht 6' (1.829 m)  Wt 280 lb 12.8 oz (127.37 kg)  BMI 38.07 kg/m2  SpO2 98%  Physical Exam  Constitutional: He is oriented to person, place, and time. He appears well-developed and well-nourished. No distress.  HENT:  Head: Normocephalic and atraumatic.  Right Ear: External ear normal.  Left Ear: External ear normal.  Nose: Nose normal.  Eyes: Conjunctivae and EOM are normal. Pupils are equal, round, and reactive to light. No scleral icterus.  Neck: Normal range of motion. Neck supple. No tracheal deviation present.  Cardiovascular: Normal rate, regular rhythm and normal heart sounds.   Pulmonary/Chest: Effort normal. No respiratory distress. He has no wheezes. He has no rales.  Abdominal: He exhibits no mass. There is  no tenderness. There is no rebound and no guarding.  Musculoskeletal: He exhibits no edema.  Lymphadenopathy:    He has no cervical adenopathy.  Neurological: He is alert and oriented to person, place, and time. Coordination normal.  Skin: Skin is warm and dry. No rash noted.  Psychiatric: He has a normal mood and affect. His behavior is normal.      Assessment & Plan:   Victor Jensen was seen today for shortness of breath, chills, nausea, dizziness and numbness.  Diagnoses and all orders for this visit:  Nausea without vomiting -     POCT CBC -     Comprehensive metabolic panel -     Amylase -     Lipase -     EKG 12-Lead -     Discontinue: ondansetron (ZOFRAN-ODT) disintegrating tablet 4 mg; Take 1 tablet (4 mg total) by mouth once. -     ondansetron (ZOFRAN-ODT) disintegrating tablet 8 mg; Take 2 tablets (8 mg total) by mouth once.  Shortness of breath  Chest tightness or pressure   I am having Victor Jensen maintain his ibuprofen, predniSONE, and indomethacin. We administered ondansetron.  Meds ordered this encounter  Medications  . DISCONTD: ondansetron (ZOFRAN-ODT) disintegrating tablet 4 mg    Sig:   . ondansetron (ZOFRAN-ODT) disintegrating tablet 8 mg    Sig:     He has a an abnormal EKG and I advised him to the emergency room by ambulance is wife is here with him. They declined ambulance transport and agreed to take him directly to emergency room understanding there is an increased risk of adverse outcome by not going by ambulance.  Appropriate red flag conditions were discussed with the patient as well as actions that should be taken.  Patient expressed his understanding.  Follow-up: No Follow-up on file.  Carmelina DaneAnderson, Jeanpierre Thebeau S, MD   Results for orders placed or performed in visit on 11/14/14  POCT CBC  Result Value Ref Range   WBC 7.8 4.6 - 10.2 K/uL   Lymph, poc 2.1 0.6 - 3.4   POC LYMPH PERCENT 27.2 10 - 50 %L   MID (cbc) 0.2 0 - 0.9   POC MID % 2.6 0 - 12  %M   POC Granulocyte 5.5 2 - 6.9   Granulocyte percent 70.2 37 - 80 %G   RBC 5.71 4.69 - 6.13 M/uL   Hemoglobin 14.8 14.1 - 18.1 g/dL   HCT, POC 16.144.4 09.643.5 - 53.7 %   MCV 77.6 (A) 80 - 97 fL   MCH, POC 25.9 (A) 27 - 31.2 pg   MCHC 33.3 31.8 - 35.4 g/dL   RDW, POC 04.514.2 %   Platelet Count, POC 233 142 - 424 K/uL   MPV 7.2 0 - 99.8 fL

## 2014-11-14 NOTE — ED Provider Notes (Signed)
CSN: [id number]     Arrival date & time 11/14/14  1500 History   First MD Initiated Contact with Patient 11/14/14 1521     Chief Complaint  Patient presents with  . Weakness     (Consider location/radiation/quality/duration/timing/severity/associated sxs/prior Treatment) HPI   PCP: Default, Provider, MD PMH: allergies  Victor Jensen is a 29 y.o.  male  CHIEF COMPLAINT: Abnormal EKG sent from he Urgent Care  Patient presents to the ED from the Urgent Care by private vehicle after an abnormal EKG was done. He went to the Urgent care because he reports having an episode of , feeling weak all over, became diaphoretic had the urge to have a bowel movement, dizzy and weak. He decided to go to bed and when he woke up he felt significantly better. While working out earlier today he had a recurrent episode of the same and became concerned by this and decided to be evaluated. He currently has no symptoms and reports they resolved after being given the Zofran at the Urgent Care.  ROS: The patient denies diaphoresis, fever, headache, weakness ( focal), confusion, change of vision,  dysphagia, aphagia, shortness of breath, abdominal pains, nausea, vomiting, diarrhea, lower extremity swelling, rash, neck pain, chest pain   Past Medical History  Diagnosis Date  . Allergy    History reviewed. No pertinent past surgical history. Family History  Problem Relation Age of Onset  . Diabetes Father   . Diabetes Maternal Grandfather   . Diabetes Paternal Grandfather   . Cancer Other    Social History  Substance Use Topics  . Smoking status: Never Smoker   . Smokeless tobacco: Never Used  . Alcohol Use: 0.0 oz/week    0 Standard drinks or equivalent per week     Comment: occasional    Review of Systems  10 Systems reviewed and are negative for acute change except as noted in the HPI.     Allergies  Review of patient's allergies indicates no known allergies.  Home Medications    Prior to Admission medications   Medication Sig Start Date End Date Taking? Authorizing Provider  ibuprofen (ADVIL,MOTRIN) 200 MG tablet Take 200 mg by mouth every 6 (six) hours as needed.    Historical Provider, MD  indomethacin (INDOCIN) 50 MG capsule Take 1 capsule (50 mg total) by mouth 2 (two) times daily with a meal. Patient not taking: Reported on 09/07/2014 07/08/14   Elvina Sidle, MD  predniSONE (DELTASONE) 20 MG tablet Two daily with food Patient not taking: Reported on 09/07/2014 07/07/14   Elvina Sidle, MD   BP 92/56 mmHg  Pulse 68  Temp(Src) 99.2 F (37.3 C) (Oral)  Resp 16  Ht 6' (1.829 m)  Wt 280 lb 4.8 oz (127.143 kg)  BMI 38.01 kg/m2  SpO2 98% Physical Exam  Constitutional: He appears well-developed and well-nourished. No distress.  HENT:  Head: Normocephalic and atraumatic.  Eyes: Pupils are equal, round, and reactive to light.  Neck: Normal range of motion. Neck supple.  Cardiovascular: Normal rate and regular rhythm.   Pulmonary/Chest: Effort normal and breath sounds normal. He has no wheezes. He has no rhonchi. He exhibits no tenderness.  Abdominal: Soft.  Musculoskeletal:  No LE swelling  Neurological: He is alert.  Cranial nerves II-VIII and X-XII evaluated and show no deficits. Pt alert and oriented x 3 Upper and lower extremity strength is symmetrical and physiologic Normal muscular tone No facial droop Coordination intact, no limb ataxia,  Skin: Skin is  warm and dry.  Nursing note and vitals reviewed.   ED Course  Procedures (including critical care time) Labs Review Labs Reviewed  BASIC METABOLIC PANEL - Abnormal; Notable for the following:    Glucose, Bld 102 (*)    All other components within normal limits  CBC - Abnormal; Notable for the following:    MCV 77.0 (*)    All other components within normal limits  URINE RAPID DRUG SCREEN, HOSP PERFORMED - Abnormal; Notable for the following:    Tetrahydrocannabinol POSITIVE (*)    All  other components within normal limits  I-STAT TROPOININ, ED  I-STAT TROPOININ, ED  I-STAT TROPOININ, ED    Imaging Review Dg Chest 2 View  11/14/2014  CLINICAL DATA:  Began having chest pain and feeling like he could not breathe while sitting on couch last night, became diaphoretic, week, and dizzy, return of symptoms today when he went to workout EXAM: CHEST  2 VIEW COMPARISON:  12/15/2013 FINDINGS: Upper normal size of cardiac silhouette. Mediastinal contours and pulmonary vascularity normal. Lungs clear. No pulmonary infiltrate, pleural effusion or pneumothorax. Bones unremarkable. IMPRESSION: No acute abnormalities. Electronically Signed   By: Ulyses SouthwardMark  Boles M.D.   On: 11/14/2014 16:23   I have personally reviewed and evaluated these images and lab results as part of my medical decision-making.   EKG Interpretation   Date/Time:  Saturday November 14 2014 15:12:40 EDT Ventricular Rate:  60 PR Interval:  204 QRS Duration: 92 QT Interval:  376 QTC Calculation: 376 R Axis:   85 Text Interpretation:  Normal sinus rhythm ST \\T \ T wave abnormality,  consider inferolateral ischemia Abnormal ECG avR ST elevation with  inferior and lateral t wave inversions and inferior ST depression  Confirmed by Rhunette CroftNANAVATI, MD, Janey GentaANKIT [phone]([id number]) on 11/14/2014 4:54:53 PM      MDM   Final diagnoses:  Abnormal EKG    The patient was seen by Dr. Rhunette CroftNanavati as well. He reviewed the EKG with the cardiologist.  His chest xray is normal, CBC, BMP, and his first Troponin is normal. He has had no chest pains. His UDS is positive for Marijuana.  It is recommended by Dr. Rhunette CroftNanavati that he receive a delta troponin and so long as that is normal the patient can follow-up and receive further work-up as an outpatient. Findings and plan disscused with the patient and his wife. They are in understanding and comfortable with the plan.  Filed Vitals:   11/14/14 1830  BP: 92/56  Pulse: 68  Temp:   Resp: [address]          Nalani Andreen, PA-C 11/14/14 1934  Derwood KaplanAnkit Nanavati, MD 11/17/14 0010

## 2014-11-14 NOTE — Addendum Note (Signed)
Addended by: Damita LackLORING, DONNA S on: 11/14/2014 03:22 PM   Modules accepted: Orders

## 2014-11-14 NOTE — ED Notes (Signed)
Pt was sitting on couch last night and he felt like he couldn't breathe, felt he needed to have BM so he went to the bathroom and broke out in a sweat and felt dizzy, weak. He decided to go to bed and go tup this morning felt okay, went to gym and symptoms returned whiel working out. He drove to [facility] and they did an ekg which was not normal so they sent pt to ED. A&Ox4, resp e/u

## 2014-11-14 NOTE — Patient Instructions (Signed)
Nonspecific Chest Pain  °Chest pain can be caused by many different conditions. There is always a chance that your pain could be related to something serious, such as a heart attack or a blood clot in your lungs. Chest pain can also be caused by conditions that are not life-threatening. If you have chest pain, it is very important to follow up with your health care provider. °CAUSES  °Chest pain can be caused by: °· Heartburn. °· Pneumonia or bronchitis. °· Anxiety or stress. °· Inflammation around your heart (pericarditis) or lung (pleuritis or pleurisy). °· A blood clot in your lung. °· A collapsed lung (pneumothorax). It can develop suddenly on its own (spontaneous pneumothorax) or from trauma to the chest. °· Shingles infection (varicella-zoster virus). °· Heart attack. °· Damage to the bones, muscles, and cartilage that make up your chest wall. This can include: °¨ Bruised bones due to injury. °¨ Strained muscles or cartilage due to frequent or repeated coughing or overwork. °¨ Fracture to one or more ribs. °¨ Sore cartilage due to inflammation (costochondritis). °RISK FACTORS  °Risk factors for chest pain may include: °· Activities that increase your risk for trauma or injury to your chest. °· Respiratory infections or conditions that cause frequent coughing. °· Medical conditions or overeating that can cause heartburn. °· Heart disease or family history of heart disease. °· Conditions or health behaviors that increase your risk of developing a blood clot. °· Having had chicken pox (varicella zoster). °SIGNS AND SYMPTOMS °Chest pain can feel like: °· Burning or tingling on the surface of your chest or deep in your chest. °· Crushing, pressure, aching, or squeezing pain. °· Dull or sharp pain that is worse when you move, cough, or take a deep breath. °· Pain that is also felt in your back, neck, shoulder, or arm, or pain that spreads to any of these areas. °Your chest pain may come and go, or it may stay  constant. °DIAGNOSIS °Lab tests or other studies may be needed to find the cause of your pain. Your health care provider may have you take a test called an ambulatory ECG (electrocardiogram). An ECG records your heartbeat patterns at the time the test is performed. You may also have other tests, such as: °· Transthoracic echocardiogram (TTE). During echocardiography, sound waves are used to create a picture of all of the heart structures and to look at how blood flows through your heart. °· Transesophageal echocardiogram (TEE). This is a more advanced imaging test that obtains images from inside your body. It allows your health care provider to see your heart in finer detail. °· Cardiac monitoring. This allows your health care provider to monitor your heart rate and rhythm in real time. °· Holter monitor. This is a portable device that records your heartbeat and can help to diagnose abnormal heartbeats. It allows your health care provider to track your heart activity for several days, if needed. °· Stress tests. These can be done through exercise or by taking medicine that makes your heart beat more quickly. °· Blood tests. °· Imaging tests. °TREATMENT  °Your treatment depends on what is causing your chest pain. Treatment may include: °· Medicines. These may include: °¨ Acid blockers for heartburn. °¨ Anti-inflammatory medicine. °¨ Pain medicine for inflammatory conditions. °¨ Antibiotic medicine, if an infection is present. °¨ Medicines to dissolve blood clots. °¨ Medicines to treat coronary artery disease. °· Supportive care for conditions that do not require medicines. This may include: °¨ Resting. °¨ Applying heat   or cold packs to injured areas. °¨ Limiting activities until pain decreases. °HOME CARE INSTRUCTIONS °· If you were prescribed an antibiotic medicine, finish it all even if you start to feel better. °· Avoid any activities that bring on chest pain. °· Do not use any tobacco products, including  cigarettes, chewing tobacco, or electronic cigarettes. If you need help quitting, ask your health care provider. °· Do not drink alcohol. °· Take medicines only as directed by your health care provider. °· Keep all follow-up visits as directed by your health care provider. This is important. This includes any further testing if your chest pain does not go away. °· If heartburn is the cause for your chest pain, you may be told to keep your head raised (elevated) while sleeping. This reduces the chance that acid will go from your stomach into your esophagus. °· Make lifestyle changes as directed by your health care provider. These may include: °¨ Getting regular exercise. Ask your health care provider to suggest some activities that are safe for you. °¨ Eating a heart-healthy diet. A registered dietitian can help you to learn healthy eating options. °¨ Maintaining a healthy weight. °¨ Managing diabetes, if necessary. °¨ Reducing stress. °SEEK MEDICAL CARE IF: °· Your chest pain does not go away after treatment. °· You have a rash with blisters on your chest. °· You have a fever. °SEEK IMMEDIATE MEDICAL CARE IF:  °· Your chest pain is worse. °· You have an increasing cough, or you cough up blood. °· You have severe abdominal pain. °· You have severe weakness. °· You faint. °· You have chills. °· You have sudden, unexplained chest discomfort. °· You have sudden, unexplained discomfort in your arms, back, neck, or jaw. °· You have shortness of breath at any time. °· You suddenly start to sweat, or your skin gets clammy. °· You feel nauseous or you vomit. °· You suddenly feel light-headed or dizzy. °· Your heart begins to beat quickly, or it feels like it is skipping beats. °These symptoms may represent a serious problem that is an emergency. Do not wait to see if the symptoms will go away. Get medical help right away. Call your local emergency services (911 in the [place] Do not drive yourself to the hospital. °  °This  information is not intended to replace advice given to you by your health care provider. Make sure you discuss any questions you have with your health care provider. °  °Document Released: 10/19/2004 Document Revised: 01/30/2014 Document Reviewed: 08/15/2013 °[employer]vier Interactive Patient Education ©2016 [employer] ° °

## 2014-11-16 ENCOUNTER — Telehealth (HOSPITAL_COMMUNITY): Payer: Self-pay | Admitting: *Deleted

## 2014-11-24 ENCOUNTER — Ambulatory Visit (HOSPITAL_COMMUNITY): Payer: 59 | Attending: Cardiovascular Disease

## 2014-11-24 ENCOUNTER — Other Ambulatory Visit: Payer: Self-pay

## 2014-11-24 DIAGNOSIS — R9431 Abnormal electrocardiogram [ECG] [EKG]: Secondary | ICD-10-CM | POA: Diagnosis not present

## 2014-11-24 DIAGNOSIS — I517 Cardiomegaly: Secondary | ICD-10-CM | POA: Insufficient documentation

## 2014-12-01 ENCOUNTER — Encounter: Payer: Self-pay | Admitting: Cardiology

## 2014-12-01 ENCOUNTER — Ambulatory Visit (INDEPENDENT_AMBULATORY_CARE_PROVIDER_SITE_OTHER): Payer: 59 | Admitting: Cardiology

## 2014-12-01 VITALS — BP 134/98 | HR 71 | Ht 72.0 in | Wt 282.5 lb

## 2014-12-01 DIAGNOSIS — R072 Precordial pain: Secondary | ICD-10-CM

## 2014-12-01 DIAGNOSIS — R079 Chest pain, unspecified: Secondary | ICD-10-CM

## 2014-12-01 DIAGNOSIS — R9431 Abnormal electrocardiogram [ECG] [EKG]: Secondary | ICD-10-CM

## 2014-12-01 NOTE — Patient Instructions (Signed)
Your physician recommends that you schedule a follow-up appointment As Needed  Your physician has requested that you have an exercise tolerance test. For further information please visit [email or web address]. Please also follow instruction sheet, as given.    

## 2014-12-01 NOTE — Progress Notes (Signed)
Cardiology Office Note   Date:  12/01/2014   ID:  Victor Jensen, DOB 04/28/1985, MRN [id number]  PCP:  Default, Provider, MD  Cardiologist:   Rollene Rotunda, MD   Chief Complaint  Patient presents with  . Follow-up    consult, pt states he has been having some discomfort in his chest, no edema, no SOB, and no pain or cramps in legs      History of Present Illness: Victor Jensen is a 29 y.o. male who presents for evaluation of an abnormal EKG and chest pain. He has no past cardiac history. Recently he developed some shortness of breath acute diaphoresis. This happened one night. He developed nausea and felt like he was going to have diarrhea and throat well but he did not. His legs were weak. He crawled into bed. When he woke the next morning didn't feel particularly good but he went and walked on the treadmill and felt that he was much weaker than usual.  He went to an urgent care the next day and was found to have a markedly abnormal EKG inferior and lateral T-wave inversions. He was taken to the emergency room her cardiac enzymes were negative. He was not admitted. He was referred here for further evaluation. She's not had a severe symptoms since then. Will get occasional left chest discomfort that feels better when he moves his arm or puts his hand on his chest. He thinks he has had some increased leg weakness.  The patient denies any new symptoms such as neck or arm discomfort. There has been no new shortness of breath, PND or orthopnea. There have been no reported palpitations, presyncope or syncope.     Past Medical History  Diagnosis Date  . Allergy     Past Surgical History  Procedure Laterality Date  . None       No current outpatient prescriptions on file.   No current facility-administered medications for this visit.    Allergies:   Review of patient's allergies indicates no known allergies.    Social History:  The patient  reports that he has never  smoked. He has never used smokeless tobacco. He reports that he drinks alcohol. He reports that he uses illicit drugs (Marijuana).   Family History:  The patient's family history includes Cancer in his other; Diabetes in his father, maternal grandfather, maternal grandfather, and paternal grandfather; Heart attack (age of onset: 41) in his father.    ROS:  Please see the history of present illness.   Otherwise, review of systems are positive for poor sleeping habits, tiredness, snoring.   All other systems are reviewed and negative.    PHYSICAL EXAM: VS:  BP 134/98 mmHg  Pulse 71  Ht 6' (1.829 m)  Wt 282 lb 8 oz (128.141 kg)  BMI 38.31 kg/m2 , BMI Body mass index is 38.31 kg/(m^2). GENERAL:  Well appearing HEENT:  Pupils equal round and reactive, fundi not visualized, oral mucosa unremarkable NECK:  No jugular venous distention, waveform within normal limits, carotid upstroke brisk and symmetric, no bruits, no thyromegaly LYMPHATICS:  No cervical, inguinal adenopathy LUNGS:  Clear to auscultation bilaterally BACK:  No CVA tenderness CHEST:  Unremarkable HEART:  PMI not displaced or sustained,S1 and S2 within normal limits, no S3, no S4, no clicks, no rubs, no murmurs ABD:  Flat, positive bowel sounds normal in frequency in pitch, no bruits, no rebound, no guarding, no midline pulsatile mass, no hepatomegaly, no splenomegaly EXT:  2 plus pulses throughout, no edema, no cyanosis no clubbing SKIN:  No rashes no nodules NEURO:  Cranial nerves II through XII grossly intact, motor grossly intact throughout PSYCH:  Cognitively intact, oriented to person place and time    EKG:  EKG is not ordered today. The ekg ordered 10/22 demonstrates sinus rhythm, rate 60, inferolateral T wave inversions without old EKGs for comparison.   Recent Labs: 11/14/2014: ALT 22; BUN 7; Creatinine, Ser 1.22; Hemoglobin 14.7; Platelets 207; Potassium 4.0; Sodium 135    Lipid Panel No results found for: CHOL,  TRIG, HDL, CHOLHDL, VLDL, LDLCALC, LDLDIRECT    Wt Readings from Last 3 Encounters:  12/01/14 282 lb 8 oz (128.141 kg)  11/14/14 280 lb 4.8 oz (127.143 kg)  11/14/14 280 lb 12.8 oz (127.37 kg)      Other studies Reviewed: Additional studies/ records that were reviewed today include: ED records. Review of the above records demonstrates:  Please see elsewhere in the note.     ASSESSMENT AND PLAN:  Abnormal EKG:  Patient does have an abnormal EKG. I did review an echocardiogram that he had done prior to this appointment and he has some very mild LVH. I don't strongly suspect obstructive coronary disease but I am going to have him do a POET (Plain Old Exercise Treadmill).    HTN:  I suspect his blood pressure is more elevated than he knows and we will observe this on his treadmill and he should also by blood pressure cuff.  POOR SLEEP:  I suggested possibly a sleep consult and they will consider this.  Current medicines are reviewed at length with the patient today.  The patient does not have concerns regarding medicines.  The following changes have been made:  no change  Labs/ tests ordered today include:   Orders Placed This Encounter  Procedures  . Exercise Tolerance Test     Disposition:   FU with me as needed.     Signed, Rollene RotundaJames Serenidy Waltz, MD  12/01/2014 4:24 PM    [facility]

## 2014-12-02 ENCOUNTER — Encounter: Payer: Self-pay | Admitting: Cardiology

## 2014-12-02 DIAGNOSIS — R079 Chest pain, unspecified: Secondary | ICD-10-CM | POA: Insufficient documentation

## 2014-12-30 ENCOUNTER — Telehealth (HOSPITAL_COMMUNITY): Payer: Self-pay

## 2014-12-30 NOTE — Telephone Encounter (Signed)
Encounter complete. 

## 2015-01-01 ENCOUNTER — Ambulatory Visit (HOSPITAL_COMMUNITY)
Admission: RE | Admit: 2015-01-01 | Discharge: 2015-01-01 | Disposition: A | Discharge status: Home / Self Care | Payer: 59 | Source: Ambulatory Visit | Attending: Cardiology | Admitting: Cardiology

## 2015-01-01 DIAGNOSIS — R079 Chest pain, unspecified: Secondary | ICD-10-CM | POA: Insufficient documentation

## 2015-01-01 DIAGNOSIS — R9431 Abnormal electrocardiogram [ECG] [EKG]: Secondary | ICD-10-CM | POA: Diagnosis not present

## 2015-01-01 LAB — EXERCISE TOLERANCE TEST
CHL CUP STRESS STAGE 1 HR: 71 {beats}/min
CHL CUP STRESS STAGE 1 SBP: 130 mmHg
CHL CUP STRESS STAGE 1 SPEED: 0 mph
CHL CUP STRESS STAGE 2 GRADE: 0 %
CHL CUP STRESS STAGE 2 SPEED: 0.8 mph
CHL CUP STRESS STAGE 3 GRADE: 0.1 %
CHL CUP STRESS STAGE 4 HR: 109 {beats}/min
CHL CUP STRESS STAGE 4 SBP: 161 mmHg
CHL CUP STRESS STAGE 5 DBP: 62 mmHg
CHL CUP STRESS STAGE 7 SPEED: 4.1 mph
CHL CUP STRESS STAGE 8 DBP: 59 mmHg
CHL CUP STRESS STAGE 8 GRADE: 0 %
CHL CUP STRESS STAGE 8 HR: 150 {beats}/min
CHL CUP STRESS STAGE 9 DBP: 72 mmHg
CHL CUP STRESS STAGE 9 GRADE: 0 %
CSEPED: 10 min
CSEPEDS: 40 s
CSEPEW: 12.6 METS
CSEPPHR: 173 {beats}/min
CSEPPMHR: 91 %
MPHR: 189 {beats}/min
Percent HR: 93 %
RPE: 17
Rest HR: 61 {beats}/min
Stage 1 DBP: 87 mmHg
Stage 1 Grade: 0 %
Stage 2 HR: 72 {beats}/min
Stage 3 HR: 73 {beats}/min
Stage 3 Speed: 1 mph
Stage 4 DBP: 63 mmHg
Stage 4 Grade: 10 %
Stage 4 Speed: 1.7 mph
Stage 5 Grade: 12 %
Stage 5 HR: 123 {beats}/min
Stage 5 SBP: 168 mmHg
Stage 5 Speed: 2.5 mph
Stage 6 DBP: 73 mmHg
Stage 6 Grade: 14 %
Stage 6 HR: 153 {beats}/min
Stage 6 SBP: 190 mmHg
Stage 6 Speed: 3.4 mph
Stage 7 Grade: 16 %
Stage 7 HR: 173 {beats}/min
Stage 8 SBP: 211 mmHg
Stage 8 Speed: 0 mph
Stage 9 HR: 90 {beats}/min
Stage 9 SBP: 138 mmHg
Stage 9 Speed: 0 mph

## 2015-01-05 ENCOUNTER — Telehealth: Payer: Self-pay | Admitting: Cardiology

## 2015-01-05 DIAGNOSIS — R9430 Abnormal result of cardiovascular function study, unspecified: Secondary | ICD-10-CM

## 2015-01-05 NOTE — Telephone Encounter (Signed)
Patient is aware of Dr. Jenene SlickerHochrein's result note. Will order patient's stress echo and send message to scheduler to make him an appointment.  Notes Recorded by Rollene RotundaJames Hochrein, MD on 01/01/2015 at 2:24 PM He will need a dobutamine echo as above.

## 2015-01-05 NOTE — Telephone Encounter (Signed)
Pt is returning Nya's call in regards to his results to his stress test done on 12/9. Please f/u with pt  Thanks

## 2015-01-24 DIAGNOSIS — Z9289 Personal history of other medical treatment: Secondary | ICD-10-CM

## 2015-01-24 HISTORY — DX: Personal history of other medical treatment: Z92.89

## 2015-01-28 ENCOUNTER — Other Ambulatory Visit (HOSPITAL_COMMUNITY): Payer: 59

## 2015-02-04 ENCOUNTER — Other Ambulatory Visit (HOSPITAL_COMMUNITY): Payer: 59

## 2015-02-04 ENCOUNTER — Ambulatory Visit (HOSPITAL_BASED_OUTPATIENT_CLINIC_OR_DEPARTMENT_OTHER): Payer: 59

## 2015-02-04 ENCOUNTER — Ambulatory Visit (HOSPITAL_COMMUNITY): Payer: 59 | Attending: Cardiology

## 2015-02-04 DIAGNOSIS — R0600 Dyspnea, unspecified: Secondary | ICD-10-CM | POA: Diagnosis not present

## 2015-02-04 DIAGNOSIS — R9430 Abnormal result of cardiovascular function study, unspecified: Secondary | ICD-10-CM

## 2015-02-04 MED ORDER — ATROPINE SULFATE 0.1 MG/ML IJ SOLN
1.0000 mg | Freq: Once | INTRAMUSCULAR | Status: AC
Start: 2015-02-04 — End: 2015-02-04
  Administered 2015-02-04: 1 mg via INTRAVENOUS

## 2015-02-04 MED ORDER — DOBUTAMINE INFUSION FOR EP/ECHO/NUC (1000 MCG/ML)
40.0000 ug/kg/min | Freq: Once | INTRAVENOUS | Status: AC
  Administered 2015-02-04: 40 ug/kg/min via INTRAVENOUS

## 2015-03-03 ENCOUNTER — Encounter: Payer: Self-pay | Admitting: *Deleted

## 2015-03-08 ENCOUNTER — Ambulatory Visit (INDEPENDENT_AMBULATORY_CARE_PROVIDER_SITE_OTHER): Payer: 59

## 2015-03-08 ENCOUNTER — Ambulatory Visit (INDEPENDENT_AMBULATORY_CARE_PROVIDER_SITE_OTHER): Payer: 59 | Admitting: Family Medicine

## 2015-03-08 VITALS — BP 120/80 | HR 57 | Temp 98.3°F | Resp 20 | Ht 72.0 in | Wt 282.8 lb

## 2015-03-08 DIAGNOSIS — M545 Low back pain: Secondary | ICD-10-CM | POA: Diagnosis not present

## 2015-03-08 DIAGNOSIS — M5442 Lumbago with sciatica, left side: Secondary | ICD-10-CM

## 2015-03-08 MED ORDER — HYDROCODONE-ACETAMINOPHEN 5-325 MG PO TABS: 1.0000 | ORAL_TABLET | Freq: Four times a day (QID) | ORAL | Status: DC | PRN

## 2015-03-08 MED ORDER — CYCLOBENZAPRINE HCL 10 MG PO TABS: 10.0000 mg | ORAL_TABLET | Freq: Three times a day (TID) | ORAL | Status: DC | PRN

## 2015-03-08 MED ORDER — PREDNISONE 20 MG PO TABS: ORAL_TABLET | ORAL | Status: DC

## 2015-03-08 NOTE — Patient Instructions (Addendum)
Sciatica With Rehab The sciatic nerve runs from the back down the leg and is responsible for sensation and control of the muscles in the back (posterior) side of the thigh, lower leg, and foot. Sciatica is a condition that is characterized by inflammation of this nerve.  SYMPTOMS   Signs of nerve damage, including numbness and/or weakness along the posterior side of the lower extremity.  Pain in the back of the thigh that may also travel down the leg.  Pain that worsens when sitting for long periods of time.  Occasionally, pain in the back or buttock. CAUSES  Inflammation of the sciatic nerve is the cause of sciatica. The inflammation is due to something irritating the nerve. Common sources of irritation include:  Sitting for long periods of time.  Direct trauma to the nerve.  Arthritis of the spine.  Herniated or ruptured disk.  Slipping of the vertebrae (spondylolisthesis).  Pressure from soft tissues, such as muscles or ligament-like tissue (fascia). RISK INCREASES WITH:  Sports that place pressure or stress on the spine (football or weightlifting).  Poor strength and flexibility.  Failure to warm up properly before activity.  Family history of low back pain or disk disorders.  Previous back injury or surgery.  Poor body mechanics, especially when lifting, or poor posture. PREVENTION   Warm up and stretch properly before activity.  Maintain physical fitness:  Strength, flexibility, and endurance.  Cardiovascular fitness.  Learn and use proper technique, especially with posture and lifting. When possible, have coach correct improper technique.  Avoid activities that place stress on the spine. PROGNOSIS If treated properly, then sciatica usually resolves within 6 weeks. However, occasionally surgery is necessary.  RELATED COMPLICATIONS   Permanent nerve damage, including pain, numbness, tingle, or weakness.  Chronic back pain.  Risks of surgery: infection,  bleeding, nerve damage, or damage to surrounding tissues. TREATMENT Treatment initially involves resting from any activities that aggravate your symptoms. The use of ice and medication may help reduce pain and inflammation. The use of strengthening and stretching exercises may help reduce pain with activity. These exercises may be performed at home or with referral to a therapist. A therapist may recommend further treatments, such as transcutaneous electronic nerve stimulation (TENS) or ultrasound. Your caregiver may recommend corticosteroid injections to help reduce inflammation of the sciatic nerve. If symptoms persist despite non-surgical (conservative) treatment, then surgery may be recommended. MEDICATION  If pain medication is necessary, then nonsteroidal anti-inflammatory medications, such as aspirin and ibuprofen, or other minor pain relievers, such as acetaminophen, are often recommended.  Do not take pain medication for 7 days before surgery.  Prescription pain relievers may be given if deemed necessary by your caregiver. Use only as directed and only as much as you need.  Ointments applied to the skin may be helpful.  Corticosteroid injections may be given by your caregiver. These injections should be reserved for the most serious cases, because they may only be given a certain number of times. HEAT AND COLD  Cold treatment (icing) relieves pain and reduces inflammation. Cold treatment should be applied for 10 to 15 minutes every 2 to 3 hours for inflammation and pain and immediately after any activity that aggravates your symptoms. Use ice packs or massage the area with a piece of ice (ice massage).  Heat treatment may be used prior to performing the stretching and strengthening activities prescribed by your caregiver, physical therapist, or athletic trainer. Use a heat pack or soak the injury in warm water.   SEEK MEDICAL CARE IF:  Treatment seems to offer no benefit, or the condition  worsens.  Any medications produce adverse side effects. EXERCISES  RANGE OF MOTION (ROM) AND STRETCHING EXERCISES - Sciatica Most people with sciatic will find that their symptoms worsen with either excessive bending forward (flexion) or arching at the low back (extension). The exercises which will help resolve your symptoms will focus on the opposite motion. Your physician, physical therapist or athletic trainer will help you determine which exercises will be most helpful to resolve your low back pain. Do not complete any exercises without first consulting with your clinician. Discontinue any exercises which worsen your symptoms until you speak to your clinician. If you have pain, numbness or tingling which travels down into your buttocks, leg or foot, the goal of the therapy is for these symptoms to move closer to your back and eventually resolve. Occasionally, these leg symptoms will get better, but your low back pain may worsen; this is typically an indication of progress in your rehabilitation. Be certain to be very alert to any changes in your symptoms and the activities in which you participated in the 24 hours prior to the change. Sharing this information with your clinician will allow him/her to most efficiently treat your condition. These exercises may help you when beginning to rehabilitate your injury. Your symptoms may resolve with or without further involvement from your physician, physical therapist or athletic trainer. While completing these exercises, remember:   Restoring tissue flexibility helps normal motion to return to the joints. This allows healthier, less painful movement and activity.  An effective stretch should be held for at least 30 seconds.  A stretch should never be painful. You should only feel a gentle lengthening or release in the stretched tissue. FLEXION RANGE OF MOTION AND STRETCHING EXERCISES: STRETCH - Flexion, Single Knee to Chest   Lie on a firm bed or floor  with both legs extended in front of you.  Keeping one leg in contact with the floor, bring your opposite knee to your chest. Hold your leg in place by either grabbing behind your thigh or at your knee.  Pull until you feel a gentle stretch in your low back. Hold __________ seconds.  Slowly release your grasp and repeat the exercise with the opposite side. Repeat __________ times. Complete this exercise __________ times per day.  STRETCH - Flexion, Double Knee to Chest  Lie on a firm bed or floor with both legs extended in front of you.  Keeping one leg in contact with the floor, bring your opposite knee to your chest.  Tense your stomach muscles to support your back and then lift your other knee to your chest. Hold your legs in place by either grabbing behind your thighs or at your knees.  Pull both knees toward your chest until you feel a gentle stretch in your low back. Hold __________ seconds.  Tense your stomach muscles and slowly return one leg at a time to the floor. Repeat __________ times. Complete this exercise __________ times per day.  STRETCH - Low Trunk Rotation   Lie on a firm bed or floor. Keeping your legs in front of you, bend your knees so they are both pointed toward the ceiling and your feet are flat on the floor.  Extend your arms out to the side. This will stabilize your upper body by keeping your shoulders in contact with the floor.  Gently and slowly drop both knees together to one side until   you feel a gentle stretch in your low back. Hold for __________ seconds.  Tense your stomach muscles to support your low back as you bring your knees back to the starting position. Repeat the exercise to the other side. Repeat __________ times. Complete this exercise __________ times per day  EXTENSION RANGE OF MOTION AND FLEXIBILITY EXERCISES: STRETCH - Extension, Prone on Elbows  Lie on your stomach on the floor, a bed will be too soft. Place your palms about shoulder  width apart and at the height of your head.  Place your elbows under your shoulders. If this is too painful, stack pillows under your chest.  Allow your body to relax so that your hips drop lower and make contact more completely with the floor.  Hold this position for __________ seconds.  Slowly return to lying flat on the floor. Repeat __________ times. Complete this exercise __________ times per day.  RANGE OF MOTION - Extension, Prone Press Ups  Lie on your stomach on the floor, a bed will be too soft. Place your palms about shoulder width apart and at the height of your head.  Keeping your back as relaxed as possible, slowly straighten your elbows while keeping your hips on the floor. You may adjust the placement of your hands to maximize your comfort. As you gain motion, your hands will come more underneath your shoulders.  Hold this position __________ seconds.  Slowly return to lying flat on the floor. Repeat __________ times. Complete this exercise __________ times per day.  STRENGTHENING EXERCISES - Sciatica  These exercises may help you when beginning to rehabilitate your injury. These exercises should be done near your "sweet spot." This is the neutral, low-back arch, somewhere between fully rounded and fully arched, that is your least painful position. When performed in this safe range of motion, these exercises can be used for people who have either a flexion or extension based injury. These exercises may resolve your symptoms with or without further involvement from your physician, physical therapist or athletic trainer. While completing these exercises, remember:   Muscles can gain both the endurance and the strength needed for everyday activities through controlled exercises.  Complete these exercises as instructed by your physician, physical therapist or athletic trainer. Progress with the resistance and repetition exercises only as your caregiver advises.  You may  experience muscle soreness or fatigue, but the pain or discomfort you are trying to eliminate should never worsen during these exercises. If this pain does worsen, stop and make certain you are following the directions exactly. If the pain is still present after adjustments, discontinue the exercise until you can discuss the trouble with your clinician. STRENGTHENING - Deep Abdominals, Pelvic Tilt   Lie on a firm bed or floor. Keeping your legs in front of you, bend your knees so they are both pointed toward the ceiling and your feet are flat on the floor.  Tense your lower abdominal muscles to press your low back into the floor. This motion will rotate your pelvis so that your tail bone is scooping upwards rather than pointing at your feet or into the floor.  With a gentle tension and even breathing, hold this position for __________ seconds. Repeat __________ times. Complete this exercise __________ times per day.  STRENGTHENING - Abdominals, Crunches   Lie on a firm bed or floor. Keeping your legs in front of you, bend your knees so they are both pointed toward the ceiling and your feet are flat on the   floor. Cross your arms over your chest.  Slightly tip your chin down without bending your neck.  Tense your abdominals and slowly lift your trunk high enough to just clear your shoulder blades. Lifting higher can put excessive stress on the low back and does not further strengthen your abdominal muscles.  Control your return to the starting position. Repeat __________ times. Complete this exercise __________ times per day.  STRENGTHENING - Quadruped, Opposite UE/LE Lift  Assume a hands and knees position on a firm surface. Keep your hands under your shoulders and your knees under your hips. You may place padding under your knees for comfort.  Find your neutral spine and gently tense your abdominal muscles so that you can maintain this position. Your shoulders and hips should form a rectangle  that is parallel with the floor and is not twisted.  Keeping your trunk steady, lift your right hand no higher than your shoulder and then your left leg no higher than your hip. Make sure you are not holding your breath. Hold this position __________ seconds.  Continuing to keep your abdominal muscles tense and your back steady, slowly return to your starting position. Repeat with the opposite arm and leg. Repeat __________ times. Complete this exercise __________ times per day.  STRENGTHENING - Abdominals and Quadriceps, Straight Leg Raise   Lie on a firm bed or floor with both legs extended in front of you.  Keeping one leg in contact with the floor, bend the other knee so that your foot can rest flat on the floor.  Find your neutral spine, and tense your abdominal muscles to maintain your spinal position throughout the exercise.  Slowly lift your straight leg off the floor about 6 inches for a count of 15, making sure to not hold your breath.  Still keeping your neutral spine, slowly lower your leg all the way to the floor. Repeat this exercise with each leg __________ times. Complete this exercise __________ times per day. POSTURE AND BODY MECHANICS CONSIDERATIONS - Sciatica Keeping correct posture when sitting, standing or completing your activities will reduce the stress put on different body tissues, allowing injured tissues a chance to heal and limiting painful experiences. The following are general guidelines for improved posture. Your physician or physical therapist will provide you with any instructions specific to your needs. While reading these guidelines, remember:  The exercises prescribed by your provider will help you have the flexibility and strength to maintain correct postures.  The correct posture provides the optimal environment for your joints to work. All of your joints have less wear and tear when properly supported by a spine with good posture. This means you will  experience a healthier, less painful body.  Correct posture must be practiced with all of your activities, especially prolonged sitting and standing. Correct posture is as important when doing repetitive low-stress activities (typing) as it is when doing a single heavy-load activity (lifting). RESTING POSITIONS Consider which positions are most painful for you when choosing a resting position. If you have pain with flexion-based activities (sitting, bending, stooping, squatting), choose a position that allows you to rest in a less flexed posture. You would want to avoid curling into a fetal position on your side. If your pain worsens with extension-based activities (prolonged standing, working overhead), avoid resting in an extended position such as sleeping on your stomach. Most people will find more comfort when they rest with their spine in a more neutral position, neither too rounded nor too   arched. Lying on a non-sagging bed on your side with a pillow between your knees, or on your back with a pillow under your knees will often provide some relief. Keep in mind, being in any one position for a prolonged period of time, no matter how correct your posture, can still lead to stiffness. PROPER SITTING POSTURE In order to minimize stress and discomfort on your spine, you must sit with correct posture Sitting with good posture should be effortless for a healthy body. Returning to good posture is a gradual process. Many people can work toward this most comfortably by using various supports until they have the flexibility and strength to maintain this posture on their own. When sitting with proper posture, your ears will fall over your shoulders and your shoulders will fall over your hips. You should use the back of the chair to support your upper back. Your low back will be in a neutral position, just slightly arched. You may place a small pillow or folded towel at the base of your low back for support.  When  working at a desk, create an environment that supports good, upright posture. Without extra support, muscles fatigue and lead to excessive strain on joints and other tissues. Keep these recommendations in mind: CHAIR:   A chair should be able to slide under your desk when your back makes contact with the back of the chair. This allows you to work closely.  The chair's height should allow your eyes to be level with the upper part of your monitor and your hands to be slightly lower than your elbows. BODY POSITION  Your feet should make contact with the floor. If this is not possible, use a foot rest.  Keep your ears over your shoulders. This will reduce stress on your neck and low back. INCORRECT SITTING POSTURES   If you are feeling tired and unable to assume a healthy sitting posture, do not slouch or slump. This puts excessive strain on your back tissues, causing more damage and pain. Healthier options include:  Using more support, like a lumbar pillow.  Switching tasks to something that requires you to be upright or walking.  Talking a brief walk.  Lying down to rest in a neutral-spine position. PROLONGED STANDING WHILE SLIGHTLY LEANING FORWARD  When completing a task that requires you to lean forward while standing in one place for a long time, place either foot up on a stationary 2-4 inch high object to help maintain the best posture. When both feet are on the ground, the low back tends to lose its slight inward curve. If this curve flattens (or becomes too large), then the back and your other joints will experience too much stress, fatigue more quickly and can cause pain.  CORRECT STANDING POSTURES Proper standing posture should be assumed with all daily activities, even if they only take a few moments, like when brushing your teeth. As in sitting, your ears should fall over your shoulders and your shoulders should fall over your hips. You should keep a slight tension in your abdominal  muscles to brace your spine. Your tailbone should point down to the ground, not behind your body, resulting in an over-extended swayback posture.  INCORRECT STANDING POSTURES  Common incorrect standing postures include a forward head, locked knees and/or an excessive swayback. WALKING Walk with an upright posture. Your ears, shoulders and hips should all line-up. PROLONGED ACTIVITY IN A FLEXED POSITION When completing a task that requires you to bend forward   at your waist or lean over a low surface, try to find a way to stabilize 3 of 4 of your limbs. You can place a hand or elbow on your thigh or rest a knee on the surface you are reaching across. This will provide you more stability so that your muscles do not fatigue as quickly. By keeping your knees relaxed, or slightly bent, you will also reduce stress across your low back. CORRECT LIFTING TECHNIQUES DO :   Assume a wide stance. This will provide you more stability and the opportunity to get as close as possible to the object which you are lifting.  Tense your abdominals to brace your spine; then bend at the knees and hips. Keeping your back locked in a neutral-spine position, lift using your leg muscles. Lift with your legs, keeping your back straight.  Test the weight of unknown objects before attempting to lift them.  Try to keep your elbows locked down at your sides in order get the best strength from your shoulders when carrying an object.  Always ask for help when lifting heavy or awkward objects. INCORRECT LIFTING TECHNIQUES DO NOT:   Lock your knees when lifting, even if it is a small object.  Bend and twist. Pivot at your feet or move your feet when needing to change directions.  Assume that you cannot safely pick up a paperclip without proper posture.   This information is not intended to replace advice given to you by your health care provider. Make sure you discuss any questions you have with your health care provider.     Document Released: 01/09/2005 Document Revised: 05/26/2014 Document Reviewed: 04/23/2008 [employer] Interactive Patient Education 2016 [employer]  Because you received an x-ray today, you will receive an invoice from [facility]. Please contact [facility] at [phone] with questions or concerns regarding your invoice. Our billing staff will not be able to assist you with those questions.   

## 2015-03-08 NOTE — Progress Notes (Signed)
Subjective:  By signing my name below, I, Essence Howell, attest that this documentation has been prepared under the direction and in the presence of Norberto Sorenson, MD Electronically Signed: Charline Bills, ED Scribe 03/08/2015 at 12:56 PM.   Patient ID: Victor Jensen, male    DOB: [birth year], 30 y.o.   MRN: [id number]  Chief Complaint  Patient presents with  . Back Pain    lower left back    HPI HPI Comments: Victor Jensen is a 30 y.o. male who presents to the [facility] complaining of sudden onset of left lower back pain onset yesterday. Pt states that he did 25 minutes on the treadmill and 10 minutes on the elliptical yesterday. States the he attempted to sit to use the restroom last night he noticed sudden onset of left low back pain. Pain radiates into his left calf with standing and is exacerbated with sitting. Pt reports improvement with lying flat. He has tried 2 Advil tablets without significant relief. He denies h/o similar pain. He also denies hematuria, dark colored urine, changes in bowel, heartburn or indigestion. No previous back imaging, chiropractor or physical therapy.   Past Medical History  Diagnosis Date  . Allergy    Current Outpatient Prescriptions on File Prior to Visit  Medication Sig Dispense Refill  . ibuprofen (ADVIL,MOTRIN) 200 MG tablet Take 200 mg by mouth every 6 (six) hours as needed for moderate pain. Reported on 03/08/2015    . indomethacin (INDOCIN) 50 MG capsule Take 50 mg by mouth 2 (two) times daily with a meal. Reported on 03/08/2015    . predniSONE (DELTASONE) 20 MG tablet Take 20 mg by mouth 2 (two) times daily with a meal. Reported on 03/08/2015     No current facility-administered medications on file prior to visit.   No Known Allergies  Review of Systems  Constitutional: Positive for activity change. Negative for fever, chills and appetite change.  Cardiovascular: Negative for leg swelling.  Gastrointestinal: Negative  for vomiting, abdominal pain, diarrhea and constipation.  Genitourinary: Negative for hematuria, flank pain, enuresis and difficulty urinating.  Musculoskeletal: Positive for myalgias, back pain, arthralgias and gait problem. Negative for joint swelling.  Neurological: Negative for weakness and numbness.  Psychiatric/Behavioral: Positive for sleep disturbance.   BP 120/80 mmHg  Pulse 57  Temp(Src) 98.3 F (36.8 C) (Oral)  Resp 20  Ht 6' (1.829 m)  Wt 282 lb 12.8 oz (128.277 kg)  BMI 38.35 kg/m2  SpO2 98%    Objective:   Physical Exam  Constitutional: He is oriented to person, place, and time. He appears well-developed and well-nourished. No distress.  HENT:  Head: Normocephalic and atraumatic.  Eyes: Conjunctivae and EOM are normal.  Neck: Neck supple. No tracheal deviation present.  Cardiovascular: Normal rate.   Pulmonary/Chest: Effort normal. No respiratory distress.  Musculoskeletal: Normal range of motion.  Point tenderness over lower lumbar/upper sacrum. No muscle spasms palpable. Tenderness over lower L lumbar paraspinal with diaphoretic. Able to elevate to balls of foot but unable to dorsiflex. Severe restriction in lumbar flexion and extension. 5/5 on R, L 4+/5 with 4/5 in L hip flexors due to pain.   Neurological: He is alert and oriented to person, place, and time.  Reflex Scores:      Patellar reflexes are 2+ on the left side. Unable to illicit patellar reflex on the R.   Skin: Skin is warm and dry.  Psychiatric: He has a normal mood and affect. His  behavior is normal.  Nursing note and vitals reviewed.    Dg Lumbar Spine 2-3 Views  03/08/2015  CLINICAL DATA:  Sudden onset of left-sided lumbar spine pain yesterday, no known injury. EXAM: LUMBAR SPINE - 2-3 VIEW COMPARISON:  None in PACs FINDINGS: The lumbar vertebral bodies are preserved in height. The pedicles and transverse processes are intact. The disc space heights are reasonably well-maintained. There is no  spondylolisthesis. There is no significant facet joint hypertrophy. There is mild degenerative narrowing of the T11-T12 disc level. IMPRESSION: There is no acute or significant chronic bony abnormality of the lumbar spine. There is mild disc space narrowing at T11-T12. Electronically Signed   By: David  [place] M.D.   On: 03/08/2015 13:05    Assessment & Plan:   1. Left-sided low back pain with left-sided sciatica   No lifting at all and on sedative meds so out of work x 2-3d. Recheck in 2-4d - sooner if worse or alarm sxs.  Orders Placed This Encounter  Procedures  . DG Lumbar Spine 2-3 Views    Standing Status: Future     Number of Occurrences: 1     Standing Expiration Date: 03/07/2016    Order Specific Question:  Reason for Exam (SYMPTOM  OR DIAGNOSIS REQUIRED)    Answer:  severe pain with sciatica sudden onset yest,    Order Specific Question:  Preferred imaging location?    Answer:  External    Meds ordered this encounter  Medications  . predniSONE (DELTASONE) 20 MG tablet    Sig: Take 3 tabs po qd x 3d, 2 tabs po qd x 3d, 1 tab po qd x 3d    Dispense:  18 tablet    Refill:  0  . cyclobenzaprine (FLEXERIL) 10 MG tablet    Sig: Take 1 tablet (10 mg total) by mouth 3 (three) times daily as needed for muscle spasms.    Dispense:  30 tablet    Refill:  0  . HYDROcodone-acetaminophen (NORCO/VICODIN) 5-325 MG tablet    Sig: Take 1 tablet by mouth every 6 (six) hours as needed for moderate pain.    Dispense:  20 tablet    Refill:  0    I personally performed the services described in this documentation, which was scribed in my presence. The recorded information has been reviewed and considered, and addended by me as needed.  Norberto Sorenson, MD MPH

## 2015-03-10 ENCOUNTER — Ambulatory Visit (INDEPENDENT_AMBULATORY_CARE_PROVIDER_SITE_OTHER): Payer: 59 | Admitting: Family Medicine

## 2015-03-10 ENCOUNTER — Telehealth: Payer: Self-pay

## 2015-03-10 VITALS — BP 137/81 | HR 73 | Temp 98.8°F | Resp 18 | Ht 72.0 in | Wt 284.4 lb

## 2015-03-10 DIAGNOSIS — M5442 Lumbago with sciatica, left side: Secondary | ICD-10-CM | POA: Diagnosis not present

## 2015-03-10 MED ORDER — TRAMADOL HCL 50 MG PO TABS: 50.0000 mg | ORAL_TABLET | Freq: Four times a day (QID) | ORAL | Status: DC | PRN

## 2015-03-10 MED FILL — traMADol HCL 50 MG TABS: 50 | 7 days supply | Qty: 30 | Fill #0

## 2015-03-10 NOTE — Patient Instructions (Signed)
Sciatica With Rehab The sciatic nerve runs from the back down the leg and is responsible for sensation and control of the muscles in the back (posterior) side of the thigh, lower leg, and foot. Sciatica is a condition that is characterized by inflammation of this nerve.  SYMPTOMS   Signs of nerve damage, including numbness and/or weakness along the posterior side of the lower extremity.  Pain in the back of the thigh that may also travel down the leg.  Pain that worsens when sitting for long periods of time.  Occasionally, pain in the back or buttock. CAUSES  Inflammation of the sciatic nerve is the cause of sciatica. The inflammation is due to something irritating the nerve. Common sources of irritation include:  Sitting for long periods of time.  Direct trauma to the nerve.  Arthritis of the spine.  Herniated or ruptured disk.  Slipping of the vertebrae (spondylolisthesis).  Pressure from soft tissues, such as muscles or ligament-like tissue (fascia). RISK INCREASES WITH:  Sports that place pressure or stress on the spine (football or weightlifting).  Poor strength and flexibility.  Failure to warm up properly before activity.  Family history of low back pain or disk disorders.  Previous back injury or surgery.  Poor body mechanics, especially when lifting, or poor posture. PREVENTION   Warm up and stretch properly before activity.  Maintain physical fitness:  Strength, flexibility, and endurance.  Cardiovascular fitness.  Learn and use proper technique, especially with posture and lifting. When possible, have coach correct improper technique.  Avoid activities that place stress on the spine. PROGNOSIS If treated properly, then sciatica usually resolves within 6 weeks. However, occasionally surgery is necessary.  RELATED COMPLICATIONS   Permanent nerve damage, including pain, numbness, tingle, or weakness.  Chronic back pain.  Risks of surgery: infection,  bleeding, nerve damage, or damage to surrounding tissues. TREATMENT Treatment initially involves resting from any activities that aggravate your symptoms. The use of ice and medication may help reduce pain and inflammation. The use of strengthening and stretching exercises may help reduce pain with activity. These exercises may be performed at home or with referral to a therapist. A therapist may recommend further treatments, such as transcutaneous electronic nerve stimulation (TENS) or ultrasound. Your caregiver may recommend corticosteroid injections to help reduce inflammation of the sciatic nerve. If symptoms persist despite non-surgical (conservative) treatment, then surgery may be recommended. MEDICATION  If pain medication is necessary, then nonsteroidal anti-inflammatory medications, such as aspirin and ibuprofen, or other minor pain relievers, such as acetaminophen, are often recommended.  Do not take pain medication for 7 days before surgery.  Prescription pain relievers may be given if deemed necessary by your caregiver. Use only as directed and only as much as you need.  Ointments applied to the skin may be helpful.  Corticosteroid injections may be given by your caregiver. These injections should be reserved for the most serious cases, because they may only be given a certain number of times. HEAT AND COLD  Cold treatment (icing) relieves pain and reduces inflammation. Cold treatment should be applied for 10 to 15 minutes every 2 to 3 hours for inflammation and pain and immediately after any activity that aggravates your symptoms. Use ice packs or massage the area with a piece of ice (ice massage).  Heat treatment may be used prior to performing the stretching and strengthening activities prescribed by your caregiver, physical therapist, or athletic trainer. Use a heat pack or soak the injury in warm water.   SEEK MEDICAL CARE IF:  Treatment seems to offer no benefit, or the condition  worsens.  Any medications produce adverse side effects. EXERCISES  RANGE OF MOTION (ROM) AND STRETCHING EXERCISES - Sciatica Most people with sciatic will find that their symptoms worsen with either excessive bending forward (flexion) or arching at the low back (extension). The exercises which will help resolve your symptoms will focus on the opposite motion. Your physician, physical therapist or athletic trainer will help you determine which exercises will be most helpful to resolve your low back pain. Do not complete any exercises without first consulting with your clinician. Discontinue any exercises which worsen your symptoms until you speak to your clinician. If you have pain, numbness or tingling which travels down into your buttocks, leg or foot, the goal of the therapy is for these symptoms to move closer to your back and eventually resolve. Occasionally, these leg symptoms will get better, but your low back pain may worsen; this is typically an indication of progress in your rehabilitation. Be certain to be very alert to any changes in your symptoms and the activities in which you participated in the 24 hours prior to the change. Sharing this information with your clinician will allow him/her to most efficiently treat your condition. These exercises may help you when beginning to rehabilitate your injury. Your symptoms may resolve with or without further involvement from your physician, physical therapist or athletic trainer. While completing these exercises, remember:   Restoring tissue flexibility helps normal motion to return to the joints. This allows healthier, less painful movement and activity.  An effective stretch should be held for at least 30 seconds.  A stretch should never be painful. You should only feel a gentle lengthening or release in the stretched tissue. FLEXION RANGE OF MOTION AND STRETCHING EXERCISES: STRETCH - Flexion, Single Knee to Chest   Lie on a firm bed or floor  with both legs extended in front of you.  Keeping one leg in contact with the floor, bring your opposite knee to your chest. Hold your leg in place by either grabbing behind your thigh or at your knee.  Pull until you feel a gentle stretch in your low back. Hold __________ seconds.  Slowly release your grasp and repeat the exercise with the opposite side. Repeat __________ times. Complete this exercise __________ times per day.  STRETCH - Flexion, Double Knee to Chest  Lie on a firm bed or floor with both legs extended in front of you.  Keeping one leg in contact with the floor, bring your opposite knee to your chest.  Tense your stomach muscles to support your back and then lift your other knee to your chest. Hold your legs in place by either grabbing behind your thighs or at your knees.  Pull both knees toward your chest until you feel a gentle stretch in your low back. Hold __________ seconds.  Tense your stomach muscles and slowly return one leg at a time to the floor. Repeat __________ times. Complete this exercise __________ times per day.  STRETCH - Low Trunk Rotation   Lie on a firm bed or floor. Keeping your legs in front of you, bend your knees so they are both pointed toward the ceiling and your feet are flat on the floor.  Extend your arms out to the side. This will stabilize your upper body by keeping your shoulders in contact with the floor.  Gently and slowly drop both knees together to one side until   you feel a gentle stretch in your low back. Hold for __________ seconds.  Tense your stomach muscles to support your low back as you bring your knees back to the starting position. Repeat the exercise to the other side. Repeat __________ times. Complete this exercise __________ times per day  EXTENSION RANGE OF MOTION AND FLEXIBILITY EXERCISES: STRETCH - Extension, Prone on Elbows  Lie on your stomach on the floor, a bed will be too soft. Place your palms about shoulder  width apart and at the height of your head.  Place your elbows under your shoulders. If this is too painful, stack pillows under your chest.  Allow your body to relax so that your hips drop lower and make contact more completely with the floor.  Hold this position for __________ seconds.  Slowly return to lying flat on the floor. Repeat __________ times. Complete this exercise __________ times per day.  RANGE OF MOTION - Extension, Prone Press Ups  Lie on your stomach on the floor, a bed will be too soft. Place your palms about shoulder width apart and at the height of your head.  Keeping your back as relaxed as possible, slowly straighten your elbows while keeping your hips on the floor. You may adjust the placement of your hands to maximize your comfort. As you gain motion, your hands will come more underneath your shoulders.  Hold this position __________ seconds.  Slowly return to lying flat on the floor. Repeat __________ times. Complete this exercise __________ times per day.  STRENGTHENING EXERCISES - Sciatica  These exercises may help you when beginning to rehabilitate your injury. These exercises should be done near your "sweet spot." This is the neutral, low-back arch, somewhere between fully rounded and fully arched, that is your least painful position. When performed in this safe range of motion, these exercises can be used for people who have either a flexion or extension based injury. These exercises may resolve your symptoms with or without further involvement from your physician, physical therapist or athletic trainer. While completing these exercises, remember:   Muscles can gain both the endurance and the strength needed for everyday activities through controlled exercises.  Complete these exercises as instructed by your physician, physical therapist or athletic trainer. Progress with the resistance and repetition exercises only as your caregiver advises.  You may  experience muscle soreness or fatigue, but the pain or discomfort you are trying to eliminate should never worsen during these exercises. If this pain does worsen, stop and make certain you are following the directions exactly. If the pain is still present after adjustments, discontinue the exercise until you can discuss the trouble with your clinician. STRENGTHENING - Deep Abdominals, Pelvic Tilt   Lie on a firm bed or floor. Keeping your legs in front of you, bend your knees so they are both pointed toward the ceiling and your feet are flat on the floor.  Tense your lower abdominal muscles to press your low back into the floor. This motion will rotate your pelvis so that your tail bone is scooping upwards rather than pointing at your feet or into the floor.  With a gentle tension and even breathing, hold this position for __________ seconds. Repeat __________ times. Complete this exercise __________ times per day.  STRENGTHENING - Abdominals, Crunches   Lie on a firm bed or floor. Keeping your legs in front of you, bend your knees so they are both pointed toward the ceiling and your feet are flat on the   floor. Cross your arms over your chest.  Slightly tip your chin down without bending your neck.  Tense your abdominals and slowly lift your trunk high enough to just clear your shoulder blades. Lifting higher can put excessive stress on the low back and does not further strengthen your abdominal muscles.  Control your return to the starting position. Repeat __________ times. Complete this exercise __________ times per day.  STRENGTHENING - Quadruped, Opposite UE/LE Lift  Assume a hands and knees position on a firm surface. Keep your hands under your shoulders and your knees under your hips. You may place padding under your knees for comfort.  Find your neutral spine and gently tense your abdominal muscles so that you can maintain this position. Your shoulders and hips should form a rectangle  that is parallel with the floor and is not twisted.  Keeping your trunk steady, lift your right hand no higher than your shoulder and then your left leg no higher than your hip. Make sure you are not holding your breath. Hold this position __________ seconds.  Continuing to keep your abdominal muscles tense and your back steady, slowly return to your starting position. Repeat with the opposite arm and leg. Repeat __________ times. Complete this exercise __________ times per day.  STRENGTHENING - Abdominals and Quadriceps, Straight Leg Raise   Lie on a firm bed or floor with both legs extended in front of you.  Keeping one leg in contact with the floor, bend the other knee so that your foot can rest flat on the floor.  Find your neutral spine, and tense your abdominal muscles to maintain your spinal position throughout the exercise.  Slowly lift your straight leg off the floor about 6 inches for a count of 15, making sure to not hold your breath.  Still keeping your neutral spine, slowly lower your leg all the way to the floor. Repeat this exercise with each leg __________ times. Complete this exercise __________ times per day. POSTURE AND BODY MECHANICS CONSIDERATIONS - Sciatica Keeping correct posture when sitting, standing or completing your activities will reduce the stress put on different body tissues, allowing injured tissues a chance to heal and limiting painful experiences. The following are general guidelines for improved posture. Your physician or physical therapist will provide you with any instructions specific to your needs. While reading these guidelines, remember:  The exercises prescribed by your provider will help you have the flexibility and strength to maintain correct postures.  The correct posture provides the optimal environment for your joints to work. All of your joints have less wear and tear when properly supported by a spine with good posture. This means you will  experience a healthier, less painful body.  Correct posture must be practiced with all of your activities, especially prolonged sitting and standing. Correct posture is as important when doing repetitive low-stress activities (typing) as it is when doing a single heavy-load activity (lifting). RESTING POSITIONS Consider which positions are most painful for you when choosing a resting position. If you have pain with flexion-based activities (sitting, bending, stooping, squatting), choose a position that allows you to rest in a less flexed posture. You would want to avoid curling into a fetal position on your side. If your pain worsens with extension-based activities (prolonged standing, working overhead), avoid resting in an extended position such as sleeping on your stomach. Most people will find more comfort when they rest with their spine in a more neutral position, neither too rounded nor too   arched. Lying on a non-sagging bed on your side with a pillow between your knees, or on your back with a pillow under your knees will often provide some relief. Keep in mind, being in any one position for a prolonged period of time, no matter how correct your posture, can still lead to stiffness. PROPER SITTING POSTURE In order to minimize stress and discomfort on your spine, you must sit with correct posture Sitting with good posture should be effortless for a healthy body. Returning to good posture is a gradual process. Many people can work toward this most comfortably by using various supports until they have the flexibility and strength to maintain this posture on their own. When sitting with proper posture, your ears will fall over your shoulders and your shoulders will fall over your hips. You should use the back of the chair to support your upper back. Your low back will be in a neutral position, just slightly arched. You may place a small pillow or folded towel at the base of your low back for support.  When  working at a desk, create an environment that supports good, upright posture. Without extra support, muscles fatigue and lead to excessive strain on joints and other tissues. Keep these recommendations in mind: CHAIR:   A chair should be able to slide under your desk when your back makes contact with the back of the chair. This allows you to work closely.  The chair's height should allow your eyes to be level with the upper part of your monitor and your hands to be slightly lower than your elbows. BODY POSITION  Your feet should make contact with the floor. If this is not possible, use a foot rest.  Keep your ears over your shoulders. This will reduce stress on your neck and low back. INCORRECT SITTING POSTURES   If you are feeling tired and unable to assume a healthy sitting posture, do not slouch or slump. This puts excessive strain on your back tissues, causing more damage and pain. Healthier options include:  Using more support, like a lumbar pillow.  Switching tasks to something that requires you to be upright or walking.  Talking a brief walk.  Lying down to rest in a neutral-spine position. PROLONGED STANDING WHILE SLIGHTLY LEANING FORWARD  When completing a task that requires you to lean forward while standing in one place for a long time, place either foot up on a stationary 2-4 inch high object to help maintain the best posture. When both feet are on the ground, the low back tends to lose its slight inward curve. If this curve flattens (or becomes too large), then the back and your other joints will experience too much stress, fatigue more quickly and can cause pain.  CORRECT STANDING POSTURES Proper standing posture should be assumed with all daily activities, even if they only take a few moments, like when brushing your teeth. As in sitting, your ears should fall over your shoulders and your shoulders should fall over your hips. You should keep a slight tension in your abdominal  muscles to brace your spine. Your tailbone should point down to the ground, not behind your body, resulting in an over-extended swayback posture.  INCORRECT STANDING POSTURES  Common incorrect standing postures include a forward head, locked knees and/or an excessive swayback. WALKING Walk with an upright posture. Your ears, shoulders and hips should all line-up. PROLONGED ACTIVITY IN A FLEXED POSITION When completing a task that requires you to bend forward   at your waist or lean over a low surface, try to find a way to stabilize 3 of 4 of your limbs. You can place a hand or elbow on your thigh or rest a knee on the surface you are reaching across. This will provide you more stability so that your muscles do not fatigue as quickly. By keeping your knees relaxed, or slightly bent, you will also reduce stress across your low back. CORRECT LIFTING TECHNIQUES DO :   Assume a wide stance. This will provide you more stability and the opportunity to get as close as possible to the object which you are lifting.  Tense your abdominals to brace your spine; then bend at the knees and hips. Keeping your back locked in a neutral-spine position, lift using your leg muscles. Lift with your legs, keeping your back straight.  Test the weight of unknown objects before attempting to lift them.  Try to keep your elbows locked down at your sides in order get the best strength from your shoulders when carrying an object.  Always ask for help when lifting heavy or awkward objects. INCORRECT LIFTING TECHNIQUES DO NOT:   Lock your knees when lifting, even if it is a small object.  Bend and twist. Pivot at your feet or move your feet when needing to change directions.  Assume that you cannot safely pick up a paperclip without proper posture.   This information is not intended to replace advice given to you by your health care provider. Make sure you discuss any questions you have with your health care provider.     Document Released: 01/09/2005 Document Revised: 05/26/2014 Document Reviewed: 04/23/2008 [employer] Patient Education 2016 [employer]  

## 2015-03-10 NOTE — Progress Notes (Addendum)
Subjective:  By signing my name below, I, Stann Ore, attest that this documentation has been prepared under the direction and in the presence of Norberto Sorenson, MD. Electronically Signed: Stann Ore, Scribe. 03/10/2015 , 2:12 PM .  Patient was seen in Room 1 .   Patient ID: Victor Jensen, male    DOB: February 08, 1985, 30 y.o.   MRN: [id number] Chief Complaint  Patient presents with  . Follow-up    for low back  - do not feel better   HPI Victor Jensen is a 30 y.o. male who presents to [facility] for follow up on his low back. He does not feel any better.  I saw him 2 days ago. He woke up with sudden onset low back pain, no known injury. Pain exacerbated by movement and sitting. No history of prior injuries. L-spine xray were normal, though mild spacing at T11-12. Placed on prednisone taper with prn flexeril and norco as well as out of work for 2-3 days.   Pt states that the pain has radiated to his left lateral hip and shooting pain down his left buttock. He notes that he was doing some chores (including laundry and doing dishes), and felt his back pain become agonizing. He laid down afterwards for relief and just slept. When he wakes up first thing in the morning, his pain feels resolved. But as the day progresses, he feels the pain return. He reports that the pain worsens when he walks up stairs and also getting in and out of the car. He hasn't had a BM in 4 days, and he has appetite loss. He did not take hydrocodone because he doesn't like the feeling he gets. He's been taking the prednisone and the flexeril.   Past Medical History  Diagnosis Date  . Allergy    Prior to Admission medications   Medication Sig Start Date End Date Taking? Authorizing Provider  cyclobenzaprine (FLEXERIL) 10 MG tablet Take 1 tablet (10 mg total) by mouth 3 (three) times daily as needed for muscle spasms. 03/08/15   Sherren Mocha, MD  HYDROcodone-acetaminophen (NORCO/VICODIN) 5-325 MG tablet Take 1 tablet by mouth  every 6 (six) hours as needed for moderate pain. 03/08/15   Sherren Mocha, MD  predniSONE (DELTASONE) 20 MG tablet Take 3 tabs po qd x 3d, 2 tabs po qd x 3d, 1 tab po qd x 3d 03/08/15   Sherren Mocha, MD   No Known Allergies  Review of Systems  Constitutional: Positive for activity change, appetite change and fatigue. Negative for fever and unexpected weight change.  Cardiovascular: Negative for leg swelling.  Gastrointestinal: Positive for constipation. Negative for nausea, vomiting, abdominal pain and diarrhea.  Genitourinary: Positive for difficulty urinating. Negative for dysuria, urgency and frequency.  Musculoskeletal: Positive for myalgias, back pain and arthralgias. Negative for joint swelling, gait problem, neck pain and neck stiffness.  Skin: Negative for color change, rash and wound.  Neurological: Negative for weakness and numbness.  Hematological: Negative for adenopathy. Does not bruise/bleed easily.  Psychiatric/Behavioral: Positive for sleep disturbance.      Objective:   Physical Exam  Constitutional: He is oriented to person, place, and time. He appears well-developed and well-nourished. No distress.  HENT:  Head: Normocephalic and atraumatic.  Eyes: EOM are normal. Pupils are equal, round, and reactive to light.  Neck: Neck supple.  Cardiovascular: Normal rate.   Pulmonary/Chest: Effort normal. No respiratory distress.  Musculoskeletal: Normal range of motion.  Severe pain with palpation  over lower lumbar spinous process, and tenderness to palpation over bilateral lumbar paraspinal  4+/5 throughout right lower extremity 4/5 throughout left lower extremity  Neurological: He is alert and oriented to person, place, and time.  Reflex Scores:      Patellar reflexes are 1+ on the right side and 2+ on the left side. Skin: Skin is warm and dry.  Psychiatric: He has a normal mood and affect. His behavior is normal.  Nursing note and vitals reviewed.   BP 137/81 mmHg  Pulse  73  Temp(Src) 98.8 F (37.1 C) (Oral)  Resp 18  Ht 6' (1.829 m)  Wt 284 lb 6.4 oz (129.003 kg)  BMI 38.56 kg/m2  SpO2 97%    Assessment & Plan:   1. Left-sided low back pain with left-sided sciatica   Minimal improvement on prednisone and flexeril - but will complete course. No lifting due to the severity so will need to cont of of work - recheck in 5-7d.  Orders Placed This Encounter  Procedures  . MR Lumbar Spine Wo Contrast    284 lbs/not claus/no metal in eyes or body/no implants/no surgery/no diab/no liver or kidney dz/no htn/no needs  Ins-[facility]    AW and pt with epic order    Standing Status: Future     Number of Occurrences:      Standing Expiration Date: 05/07/2016    Order Specific Question:  Reason for Exam (SYMPTOM  OR DIAGNOSIS REQUIRED)    Answer:  worsening left lumbar pain and left lower ext weakness    Order Specific Question:  Preferred imaging location?    Answer:  [facility] (table limit-550lbs)    Order Specific Question:  Does the patient have a pacemaker or implanted devices?    Answer:  No    Order Specific Question:  What is the patient's sedation requirement?    Answer:  No Sedation  . Ambulatory referral to Orthopedic Surgery    Referral Priority:  Routine    Referral Type:  Surgical    Referral Reason:  Specialty Services Required    Requested Specialty:  Orthopedic Surgery    Number of Visits Requested:  1    Meds ordered this encounter  Medications  . traMADol (ULTRAM) 50 MG tablet    Sig: Take 1 tablet (50 mg total) by mouth every 6 (six) hours as needed.    Dispense:  30 tablet    Refill:  0  FMLA papers completed during visit today.  Over 40 min spent in face-to-face evaluation of and consultation with patient and coordination of care.  Over 50% of this time was spent counseling this patient.  I personally performed the services described in this documentation, which was scribed in my presence. The recorded information has  been reviewed and considered, and addended by me as needed.  Norberto Sorenson, MD MPH

## 2015-03-10 NOTE — Telephone Encounter (Signed)
[employer] faxed over FMLA forms that need to be completed by Dr Clelia Croft in regards to this patients last OV with his lower back pain. I have filled out what I could from the OV notes and highlighted the areas that need to be completed. I will place in you box on 03/10/15, please return to the FMLA/Disability box at the 102 checkout desk with in 5-7 business days. Thank you!

## 2015-03-11 NOTE — Progress Notes (Signed)
Cardiology Office Note   Date:  03/12/2015   ID:  NAKUL AVINO, DOB 07-Aug-1985, MRN [id number]  PCP:  No PCP Per Patient  Cardiologist:   Minus Breeding, MD   Chief Complaint  Patient presents with  . Abnormal ECG      History of Present Illness Victor Jensen is a 30 y.o. male who presents for evaluation of an abnormal EKG and chest pain. He has no past cardiac history. I saw him recently he developed some shortness of breath.   I sent him for a dobutamine echo because he has a markedly abnormal EKG.  With this he had a hypertensive blood pressure response. He also had concentric LVH. I brought him back to discuss this. He said some of his symptoms have improved as he is trying to eat better and exercise. He still having some dyspnea. He does snore and has fatigue.  He is not describing classic substernal chest pressure, neck or arm discomfort. He has some palpitations but no presyncope or syncope.   Past Medical History  Diagnosis Date  . Allergy     Past Surgical History  Procedure Laterality Date  . None       Current Outpatient Prescriptions  Medication Sig Dispense Refill  . cyclobenzaprine (FLEXERIL) 10 MG tablet Take 1 tablet (10 mg total) by mouth 3 (three) times daily as needed for muscle spasms. 30 tablet 0  . predniSONE (DELTASONE) 20 MG tablet Take 3 tabs po qd x 3d, 2 tabs po qd x 3d, 1 tab po qd x 3d 18 tablet 0  . traMADol (ULTRAM) 50 MG tablet Take 1 tablet (50 mg total) by mouth every 6 (six) hours as needed. 30 tablet 0  . Blood Pressure Monitoring (ADULT BLOOD PRESSURE CUFF LG) KIT 1 kit by Does not apply route daily. 1 each 0   No current facility-administered medications for this visit.    Allergies:   Review of patient's allergies indicates no known allergies.    Social History:  The patient  reports that he has never smoked. He has never used smokeless tobacco. He reports that he drinks alcohol. He reports that he uses illicit drugs  (Marijuana).   Family History:  The patient's family history includes Cancer in his other; Diabetes in his father, maternal grandfather, maternal grandfather, and paternal grandfather; Heart attack (age of onset: 15) in his father.    ROS:  Please see the history of present illness.   Otherwise, review of systems are positive for poor sleeping habits, tiredness, snoring.   All other systems are reviewed and negative.    PHYSICAL EXAM: VS:  BP 120/92 mmHg  Pulse 68  Ht 6' (1.829 m)  Wt 287 lb 5 oz (130.324 kg)  BMI 38.96 kg/m2 , BMI Body mass index is 38.96 kg/(m^2). GENERAL:  Well appearing HEENT:  Pupils equal round and reactive, fundi not visualized, oral mucosa unremarkable NECK:  No jugular venous distention, waveform within normal limits, carotid upstroke brisk and symmetric, no bruits, no thyromegaly LUNGS:  Clear to auscultation bilaterally BACK:  No CVA tenderness CHEST:  Unremarkable HEART:  PMI not displaced or sustained,S1 and S2 within normal limits, no S3, no S4, no clicks, no rubs, no murmurs ABD:  Flat, positive bowel sounds normal in frequency in pitch, no bruits, no rebound, no guarding, no midline pulsatile mass, no hepatomegaly, no splenomegaly EXT:  2 plus pulses throughout, no edema, no cyanosis no clubbing     EKG:  EKG is not ordered today.   Recent Labs: 11/14/2014: ALT 22; BUN 7; Creatinine, Ser 1.22; Hemoglobin 14.7; Platelets 207; Potassium 4.0; Sodium 135    Lipid Panel No results found for: CHOL, TRIG, HDL, CHOLHDL, VLDL, LDLCALC, LDLDIRECT    Wt Readings from Last 3 Encounters:  03/12/15 287 lb 5 oz (130.324 kg)  03/10/15 284 lb 6.4 oz (129.003 kg)  03/08/15 282 lb 12.8 oz (128.277 kg)      Other studies Reviewed: Additional studies/ records that were reviewed today include: ED records. Review of the above records demonstrates:  Please see elsewhere in the note.     ASSESSMENT AND PLAN:  Abnormal EKG:  This is likely related to his  LVH. I don't suspect ischemia. To further evaluate the LVH she'll need a cardiac MRI.  HTN:   He is going to get a BP cuff and keep a diary.  Further med changes will be based on this.   POOR SLEEP:  He has snoring, daytime somnolence, difficult control hypertension. He needs a sleep study.  Current medicines are reviewed at length with the patient today.  The patient does not have concerns regarding medicines.  The following changes have been made:  no change  Labs/ tests ordered today include:      Orders Placed This Encounter  Procedures  . MR Card Morphology Wo/W Cm  . Split night study     Disposition:   FU with me after the above studies.     Signed, Minus Breeding, MD  03/12/2015 1:19 PM    [facility]

## 2015-03-11 NOTE — Telephone Encounter (Signed)
Forms scanned and faxed over on 03/11/15

## 2015-03-12 ENCOUNTER — Ambulatory Visit (INDEPENDENT_AMBULATORY_CARE_PROVIDER_SITE_OTHER): Payer: 59 | Admitting: Cardiology

## 2015-03-12 ENCOUNTER — Encounter: Payer: Self-pay | Admitting: Cardiology

## 2015-03-12 VITALS — BP 120/92 | HR 68 | Ht 72.0 in | Wt 287.3 lb

## 2015-03-12 DIAGNOSIS — I517 Cardiomegaly: Secondary | ICD-10-CM

## 2015-03-12 DIAGNOSIS — R0683 Snoring: Secondary | ICD-10-CM | POA: Diagnosis not present

## 2015-03-12 DIAGNOSIS — R9431 Abnormal electrocardiogram [ECG] [EKG]: Secondary | ICD-10-CM

## 2015-03-12 DIAGNOSIS — I10 Essential (primary) hypertension: Secondary | ICD-10-CM | POA: Diagnosis not present

## 2015-03-12 MED ORDER — ADULT BLOOD PRESSURE CUFF LG KIT: 1.0000 | PACK | Freq: Every day | Status: DC

## 2015-03-12 NOTE — Patient Instructions (Addendum)
Medication Instructions:   NO CHANGE  Testing/Procedures:  Your physician has requested that you have a cardiac MRI. Cardiac MRI uses a computer to create images of your heart as its beating, producing both still and moving pictures of your heart and major blood vessels. For further information please visit [email or web address]. Please follow the instruction sheet given to you today for more information.   Your physician has recommended that you have a sleep study. This test records several body functions during sleep, including: brain activity, eye movement, oxygen and carbon dioxide blood levels, heart rate and rhythm, breathing rate and rhythm, the flow of air through your mouth and nose, snoring, body muscle movements, and chest and belly movement.    Follow-Up:  Your physician recommends that you schedule a follow-up appointment in: 2 MONTHS WITH DR [facility]  If you need a refill on your cardiac medications before your next appointment, please call your pharmacy.

## 2015-03-15 ENCOUNTER — Ambulatory Visit (INDEPENDENT_AMBULATORY_CARE_PROVIDER_SITE_OTHER): Payer: 59 | Admitting: Family Medicine

## 2015-03-15 VITALS — BP 132/86 | HR 76 | Temp 98.8°F | Resp 20 | Ht 72.0 in | Wt 290.0 lb

## 2015-03-15 DIAGNOSIS — M5416 Radiculopathy, lumbar region: Secondary | ICD-10-CM

## 2015-03-15 DIAGNOSIS — M5417 Radiculopathy, lumbosacral region: Secondary | ICD-10-CM

## 2015-03-15 MED ORDER — METHYLPREDNISOLONE ACETATE 80 MG/ML IJ SUSP
120.0000 mg | Freq: Once | INTRAMUSCULAR | Status: AC
  Administered 2015-03-15: 120 mg via INTRAMUSCULAR

## 2015-03-15 MED ORDER — INDOMETHACIN 50 MG PO CAPS: 50.0000 mg | ORAL_CAPSULE | Freq: Three times a day (TID) | ORAL | Status: DC

## 2015-03-15 MED ORDER — KETOROLAC TROMETHAMINE 60 MG/2ML IM SOLN
60.0000 mg | Freq: Once | INTRAMUSCULAR | Status: AC
  Administered 2015-03-15: 60 mg via INTRAMUSCULAR

## 2015-03-15 MED FILL — INDOMETHACIN 50 MG CAPSULE: 50 | 20 days supply | Qty: 60 | Fill #0

## 2015-03-15 NOTE — Patient Instructions (Signed)
Sciatica With Rehab The sciatic nerve runs from the back down the leg and is responsible for sensation and control of the muscles in the back (posterior) side of the thigh, lower leg, and foot. Sciatica is a condition that is characterized by inflammation of this nerve.  SYMPTOMS   Signs of nerve damage, including numbness and/or weakness along the posterior side of the lower extremity.  Pain in the back of the thigh that may also travel down the leg.  Pain that worsens when sitting for long periods of time.  Occasionally, pain in the back or buttock. CAUSES  Inflammation of the sciatic nerve is the cause of sciatica. The inflammation is due to something irritating the nerve. Common sources of irritation include:  Sitting for long periods of time.  Direct trauma to the nerve.  Arthritis of the spine.  Herniated or ruptured disk.  Slipping of the vertebrae (spondylolisthesis).  Pressure from soft tissues, such as muscles or ligament-like tissue (fascia). RISK INCREASES WITH:  Sports that place pressure or stress on the spine (football or weightlifting).  Poor strength and flexibility.  Failure to warm up properly before activity.  Family history of low back pain or disk disorders.  Previous back injury or surgery.  Poor body mechanics, especially when lifting, or poor posture. PREVENTION   Warm up and stretch properly before activity.  Maintain physical fitness:  Strength, flexibility, and endurance.  Cardiovascular fitness.  Learn and use proper technique, especially with posture and lifting. When possible, have coach correct improper technique.  Avoid activities that place stress on the spine. PROGNOSIS If treated properly, then sciatica usually resolves within 6 weeks. However, occasionally surgery is necessary.  RELATED COMPLICATIONS   Permanent nerve damage, including pain, numbness, tingle, or weakness.  Chronic back pain.  Risks of surgery: infection,  bleeding, nerve damage, or damage to surrounding tissues. TREATMENT Treatment initially involves resting from any activities that aggravate your symptoms. The use of ice and medication may help reduce pain and inflammation. The use of strengthening and stretching exercises may help reduce pain with activity. These exercises may be performed at home or with referral to a therapist. A therapist may recommend further treatments, such as transcutaneous electronic nerve stimulation (TENS) or ultrasound. Your caregiver may recommend corticosteroid injections to help reduce inflammation of the sciatic nerve. If symptoms persist despite non-surgical (conservative) treatment, then surgery may be recommended. MEDICATION  If pain medication is necessary, then nonsteroidal anti-inflammatory medications, such as aspirin and ibuprofen, or other minor pain relievers, such as acetaminophen, are often recommended.  Do not take pain medication for 7 days before surgery.  Prescription pain relievers may be given if deemed necessary by your caregiver. Use only as directed and only as much as you need.  Ointments applied to the skin may be helpful.  Corticosteroid injections may be given by your caregiver. These injections should be reserved for the most serious cases, because they may only be given a certain number of times. HEAT AND COLD  Cold treatment (icing) relieves pain and reduces inflammation. Cold treatment should be applied for 10 to 15 minutes every 2 to 3 hours for inflammation and pain and immediately after any activity that aggravates your symptoms. Use ice packs or massage the area with a piece of ice (ice massage).  Heat treatment may be used prior to performing the stretching and strengthening activities prescribed by your caregiver, physical therapist, or athletic trainer. Use a heat pack or soak the injury in warm water.   SEEK MEDICAL CARE IF:  Treatment seems to offer no benefit, or the condition  worsens.  Any medications produce adverse side effects. EXERCISES  RANGE OF MOTION (ROM) AND STRETCHING EXERCISES - Sciatica Most people with sciatic will find that their symptoms worsen with either excessive bending forward (flexion) or arching at the low back (extension). The exercises which will help resolve your symptoms will focus on the opposite motion. Your physician, physical therapist or athletic trainer will help you determine which exercises will be most helpful to resolve your low back pain. Do not complete any exercises without first consulting with your clinician. Discontinue any exercises which worsen your symptoms until you speak to your clinician. If you have pain, numbness or tingling which travels down into your buttocks, leg or foot, the goal of the therapy is for these symptoms to move closer to your back and eventually resolve. Occasionally, these leg symptoms will get better, but your low back pain may worsen; this is typically an indication of progress in your rehabilitation. Be certain to be very alert to any changes in your symptoms and the activities in which you participated in the 24 hours prior to the change. Sharing this information with your clinician will allow him/her to most efficiently treat your condition. These exercises may help you when beginning to rehabilitate your injury. Your symptoms may resolve with or without further involvement from your physician, physical therapist or athletic trainer. While completing these exercises, remember:   Restoring tissue flexibility helps normal motion to return to the joints. This allows healthier, less painful movement and activity.  An effective stretch should be held for at least 30 seconds.  A stretch should never be painful. You should only feel a gentle lengthening or release in the stretched tissue. FLEXION RANGE OF MOTION AND STRETCHING EXERCISES: STRETCH - Flexion, Single Knee to Chest   Lie on a firm bed or floor  with both legs extended in front of you.  Keeping one leg in contact with the floor, bring your opposite knee to your chest. Hold your leg in place by either grabbing behind your thigh or at your knee.  Pull until you feel a gentle stretch in your low back. Hold __________ seconds.  Slowly release your grasp and repeat the exercise with the opposite side. Repeat __________ times. Complete this exercise __________ times per day.  STRETCH - Flexion, Double Knee to Chest  Lie on a firm bed or floor with both legs extended in front of you.  Keeping one leg in contact with the floor, bring your opposite knee to your chest.  Tense your stomach muscles to support your back and then lift your other knee to your chest. Hold your legs in place by either grabbing behind your thighs or at your knees.  Pull both knees toward your chest until you feel a gentle stretch in your low back. Hold __________ seconds.  Tense your stomach muscles and slowly return one leg at a time to the floor. Repeat __________ times. Complete this exercise __________ times per day.  STRETCH - Low Trunk Rotation   Lie on a firm bed or floor. Keeping your legs in front of you, bend your knees so they are both pointed toward the ceiling and your feet are flat on the floor.  Extend your arms out to the side. This will stabilize your upper body by keeping your shoulders in contact with the floor.  Gently and slowly drop both knees together to one side until   you feel a gentle stretch in your low back. Hold for __________ seconds.  Tense your stomach muscles to support your low back as you bring your knees back to the starting position. Repeat the exercise to the other side. Repeat __________ times. Complete this exercise __________ times per day  EXTENSION RANGE OF MOTION AND FLEXIBILITY EXERCISES: STRETCH - Extension, Prone on Elbows  Lie on your stomach on the floor, a bed will be too soft. Place your palms about shoulder  width apart and at the height of your head.  Place your elbows under your shoulders. If this is too painful, stack pillows under your chest.  Allow your body to relax so that your hips drop lower and make contact more completely with the floor.  Hold this position for __________ seconds.  Slowly return to lying flat on the floor. Repeat __________ times. Complete this exercise __________ times per day.  RANGE OF MOTION - Extension, Prone Press Ups  Lie on your stomach on the floor, a bed will be too soft. Place your palms about shoulder width apart and at the height of your head.  Keeping your back as relaxed as possible, slowly straighten your elbows while keeping your hips on the floor. You may adjust the placement of your hands to maximize your comfort. As you gain motion, your hands will come more underneath your shoulders.  Hold this position __________ seconds.  Slowly return to lying flat on the floor. Repeat __________ times. Complete this exercise __________ times per day.  STRENGTHENING EXERCISES - Sciatica  These exercises may help you when beginning to rehabilitate your injury. These exercises should be done near your "sweet spot." This is the neutral, low-back arch, somewhere between fully rounded and fully arched, that is your least painful position. When performed in this safe range of motion, these exercises can be used for people who have either a flexion or extension based injury. These exercises may resolve your symptoms with or without further involvement from your physician, physical therapist or athletic trainer. While completing these exercises, remember:   Muscles can gain both the endurance and the strength needed for everyday activities through controlled exercises.  Complete these exercises as instructed by your physician, physical therapist or athletic trainer. Progress with the resistance and repetition exercises only as your caregiver advises.  You may  experience muscle soreness or fatigue, but the pain or discomfort you are trying to eliminate should never worsen during these exercises. If this pain does worsen, stop and make certain you are following the directions exactly. If the pain is still present after adjustments, discontinue the exercise until you can discuss the trouble with your clinician. STRENGTHENING - Deep Abdominals, Pelvic Tilt   Lie on a firm bed or floor. Keeping your legs in front of you, bend your knees so they are both pointed toward the ceiling and your feet are flat on the floor.  Tense your lower abdominal muscles to press your low back into the floor. This motion will rotate your pelvis so that your tail bone is scooping upwards rather than pointing at your feet or into the floor.  With a gentle tension and even breathing, hold this position for __________ seconds. Repeat __________ times. Complete this exercise __________ times per day.  STRENGTHENING - Abdominals, Crunches   Lie on a firm bed or floor. Keeping your legs in front of you, bend your knees so they are both pointed toward the ceiling and your feet are flat on the   floor. Cross your arms over your chest.  Slightly tip your chin down without bending your neck.  Tense your abdominals and slowly lift your trunk high enough to just clear your shoulder blades. Lifting higher can put excessive stress on the low back and does not further strengthen your abdominal muscles.  Control your return to the starting position. Repeat __________ times. Complete this exercise __________ times per day.  STRENGTHENING - Quadruped, Opposite UE/LE Lift  Assume a hands and knees position on a firm surface. Keep your hands under your shoulders and your knees under your hips. You may place padding under your knees for comfort.  Find your neutral spine and gently tense your abdominal muscles so that you can maintain this position. Your shoulders and hips should form a rectangle  that is parallel with the floor and is not twisted.  Keeping your trunk steady, lift your right hand no higher than your shoulder and then your left leg no higher than your hip. Make sure you are not holding your breath. Hold this position __________ seconds.  Continuing to keep your abdominal muscles tense and your back steady, slowly return to your starting position. Repeat with the opposite arm and leg. Repeat __________ times. Complete this exercise __________ times per day.  STRENGTHENING - Abdominals and Quadriceps, Straight Leg Raise   Lie on a firm bed or floor with both legs extended in front of you.  Keeping one leg in contact with the floor, bend the other knee so that your foot can rest flat on the floor.  Find your neutral spine, and tense your abdominal muscles to maintain your spinal position throughout the exercise.  Slowly lift your straight leg off the floor about 6 inches for a count of 15, making sure to not hold your breath.  Still keeping your neutral spine, slowly lower your leg all the way to the floor. Repeat this exercise with each leg __________ times. Complete this exercise __________ times per day. POSTURE AND BODY MECHANICS CONSIDERATIONS - Sciatica Keeping correct posture when sitting, standing or completing your activities will reduce the stress put on different body tissues, allowing injured tissues a chance to heal and limiting painful experiences. The following are general guidelines for improved posture. Your physician or physical therapist will provide you with any instructions specific to your needs. While reading these guidelines, remember:  The exercises prescribed by your provider will help you have the flexibility and strength to maintain correct postures.  The correct posture provides the optimal environment for your joints to work. All of your joints have less wear and tear when properly supported by a spine with good posture. This means you will  experience a healthier, less painful body.  Correct posture must be practiced with all of your activities, especially prolonged sitting and standing. Correct posture is as important when doing repetitive low-stress activities (typing) as it is when doing a single heavy-load activity (lifting). RESTING POSITIONS Consider which positions are most painful for you when choosing a resting position. If you have pain with flexion-based activities (sitting, bending, stooping, squatting), choose a position that allows you to rest in a less flexed posture. You would want to avoid curling into a fetal position on your side. If your pain worsens with extension-based activities (prolonged standing, working overhead), avoid resting in an extended position such as sleeping on your stomach. Most people will find more comfort when they rest with their spine in a more neutral position, neither too rounded nor too   arched. Lying on a non-sagging bed on your side with a pillow between your knees, or on your back with a pillow under your knees will often provide some relief. Keep in mind, being in any one position for a prolonged period of time, no matter how correct your posture, can still lead to stiffness. PROPER SITTING POSTURE In order to minimize stress and discomfort on your spine, you must sit with correct posture Sitting with good posture should be effortless for a healthy body. Returning to good posture is a gradual process. Many people can work toward this most comfortably by using various supports until they have the flexibility and strength to maintain this posture on their own. When sitting with proper posture, your ears will fall over your shoulders and your shoulders will fall over your hips. You should use the back of the chair to support your upper back. Your low back will be in a neutral position, just slightly arched. You may place a small pillow or folded towel at the base of your low back for support.  When  working at a desk, create an environment that supports good, upright posture. Without extra support, muscles fatigue and lead to excessive strain on joints and other tissues. Keep these recommendations in mind: CHAIR:   A chair should be able to slide under your desk when your back makes contact with the back of the chair. This allows you to work closely.  The chair's height should allow your eyes to be level with the upper part of your monitor and your hands to be slightly lower than your elbows. BODY POSITION  Your feet should make contact with the floor. If this is not possible, use a foot rest.  Keep your ears over your shoulders. This will reduce stress on your neck and low back. INCORRECT SITTING POSTURES   If you are feeling tired and unable to assume a healthy sitting posture, do not slouch or slump. This puts excessive strain on your back tissues, causing more damage and pain. Healthier options include:  Using more support, like a lumbar pillow.  Switching tasks to something that requires you to be upright or walking.  Talking a brief walk.  Lying down to rest in a neutral-spine position. PROLONGED STANDING WHILE SLIGHTLY LEANING FORWARD  When completing a task that requires you to lean forward while standing in one place for a long time, place either foot up on a stationary 2-4 inch high object to help maintain the best posture. When both feet are on the ground, the low back tends to lose its slight inward curve. If this curve flattens (or becomes too large), then the back and your other joints will experience too much stress, fatigue more quickly and can cause pain.  CORRECT STANDING POSTURES Proper standing posture should be assumed with all daily activities, even if they only take a few moments, like when brushing your teeth. As in sitting, your ears should fall over your shoulders and your shoulders should fall over your hips. You should keep a slight tension in your abdominal  muscles to brace your spine. Your tailbone should point down to the ground, not behind your body, resulting in an over-extended swayback posture.  INCORRECT STANDING POSTURES  Common incorrect standing postures include a forward head, locked knees and/or an excessive swayback. WALKING Walk with an upright posture. Your ears, shoulders and hips should all line-up. PROLONGED ACTIVITY IN A FLEXED POSITION When completing a task that requires you to bend forward   at your waist or lean over a low surface, try to find a way to stabilize 3 of 4 of your limbs. You can place a hand or elbow on your thigh or rest a knee on the surface you are reaching across. This will provide you more stability so that your muscles do not fatigue as quickly. By keeping your knees relaxed, or slightly bent, you will also reduce stress across your low back. CORRECT LIFTING TECHNIQUES DO :   Assume a wide stance. This will provide you more stability and the opportunity to get as close as possible to the object which you are lifting.  Tense your abdominals to brace your spine; then bend at the knees and hips. Keeping your back locked in a neutral-spine position, lift using your leg muscles. Lift with your legs, keeping your back straight.  Test the weight of unknown objects before attempting to lift them.  Try to keep your elbows locked down at your sides in order get the best strength from your shoulders when carrying an object.  Always ask for help when lifting heavy or awkward objects. INCORRECT LIFTING TECHNIQUES DO NOT:   Lock your knees when lifting, even if it is a small object.  Bend and twist. Pivot at your feet or move your feet when needing to change directions.  Assume that you cannot safely pick up a paperclip without proper posture.   This information is not intended to replace advice given to you by your health care provider. Make sure you discuss any questions you have with your health care provider.     Document Released: 01/09/2005 Document Revised: 05/26/2014 Document Reviewed: 04/23/2008 [employer] Patient Education 2016 [employer]  

## 2015-03-15 NOTE — Progress Notes (Signed)
Subjective:    Patient ID: Victor Jensen, male    DOB: 1985-06-28, 30 y.o.   MRN: [id number] By signing my name below, I, Zola Button, attest that this documentation has been prepared under the direction and in the presence of Delman Cheadle, MD.  Electronically Signed: Zola Button, Medical Scribe. 03/15/2015. 10:13 AM.  Chief Complaint  Patient presents with  . Follow-up    BACK PAIN    HPI HPI Comments: Victor Jensen is a 30 y.o. male who presents to the [facility] for a follow-up for left-sided low back pain. He first developed left-sided low back pain with no known mechanism of injury 1 week ago. Put on prednisone taper. Saw him again 2 days later without significant improvement to pain. He was having significant weakness, so advised lumbar MRI and orthopedics referral and completed FMLA papers as his job involves heavy lifting, which he cannot do. He was referred for a sleep study by his cardiologist.   Patient has an MRI scheduled in 5 days. He has not yet received a call from orthopedics. The pain radiates to his left buttock and partially into his left leg. He still has some numbness and weakness in his left leg. The pain is worse when sitting, bending, and sitting up. The pain is improved when standing or laying flat. He has been icing his back. He was at a basketball game a few days ago and notes he could not tolerate sitting for the whole game and had to stand against the wall. He did do some shopping yesterday and had been walking around a lot, which seemed to exacerbate his pain; he had not taken any flexeril that day because his pain seemed to be improving. Patient believes the prednisone has been providing the greatest relief. He has been taking all of his medications at the same time. He has noticed some drowsiness from the flexeril. Patient denies any GI issues. His bowel movements have improved. He has been drinking plenty of water.  Past Medical History    Diagnosis Date  . Allergy    Current Outpatient Prescriptions on File Prior to Visit  Medication Sig Dispense Refill  . Blood Pressure Monitoring (ADULT BLOOD PRESSURE CUFF LG) KIT 1 kit by Does not apply route daily. 1 each 0  . cyclobenzaprine (FLEXERIL) 10 MG tablet Take 1 tablet (10 mg total) by mouth 3 (three) times daily as needed for muscle spasms. 30 tablet 0  . traMADol (ULTRAM) 50 MG tablet Take 1 tablet (50 mg total) by mouth every 6 (six) hours as needed. 30 tablet 0   No current facility-administered medications on file prior to visit.   No Known Allergies   Review of Systems  Constitutional: Positive for activity change and unexpected weight change (gain since unable exercise). Negative for fever, chills, diaphoresis, appetite change and fatigue.  Cardiovascular: Negative for leg swelling.  Gastrointestinal: Negative.  Negative for constipation.  Genitourinary: Negative for enuresis and difficulty urinating.  Musculoskeletal: Positive for myalgias, back pain, arthralgias and gait problem. Negative for joint swelling, neck pain and neck stiffness.  Neurological: Positive for weakness and numbness.  Hematological: Negative for adenopathy. Does not bruise/bleed easily.  Psychiatric/Behavioral: Positive for sleep disturbance.       Objective:  BP 132/86 mmHg  Pulse 76  Temp(Src) 98.8 F (37.1 C) (Oral)  Resp 20  Ht 6' (1.829 m)  Wt 290 lb (131.543 kg)  BMI 39.32 kg/m2  SpO2 98%  Physical  Exam  Constitutional: He is oriented to person, place, and time. He appears well-developed and well-nourished. No distress.  HENT:  Head: Normocephalic and atraumatic.  Mouth/Throat: Oropharynx is clear and moist. No oropharyngeal exudate.  Eyes: Pupils are equal, round, and reactive to light.  Neck: Neck supple.  Cardiovascular: Normal rate.   Pulmonary/Chest: Effort normal.  Musculoskeletal: He exhibits no edema.  Mild TTP upper lumbar spine. TTP with some muscle spasms  over the left lumbar paraspinal. Positive straight leg raise on the left only.  Neurological: He is alert and oriented to person, place, and time. No cranial nerve deficit.  Reflex Scores:      Patellar reflexes are 1+ on the right side and 1+ on the left side.      Achilles reflexes are 2+ on the right side and 2+ on the left side. 5/5 muscle strength on the right. 4/5 strength on dorsiflexion, ham, and quads on the left; 5/5 plantarflexion on the left. 4+/5 strength on bilateral hip flexors.  Skin: Skin is warm and dry. No rash noted.  Psychiatric: He has a normal mood and affect. His behavior is normal.  Nursing note and vitals reviewed.       Assessment & Plan:   1. Lumbar back pain with radiculopathy affecting left lower extremity     Orders Placed This Encounter  Procedures  . Ambulatory referral to Physical Therapy    Referral Priority:  Routine    Referral Type:  Physical Medicine    Referral Reason:  Specialty Services Required    Requested Specialty:  Physical Therapy    Number of Visits Requested:  1    Meds ordered this encounter  Medications  . methylPREDNISolone acetate (DEPO-MEDROL) injection 120 mg    Sig:   . ketorolac (TORADOL) injection 60 mg    Sig:   . indomethacin (INDOCIN) 50 MG capsule    Sig: Take 1 capsule (50 mg total) by mouth 3 (three) times daily with meals.    Dispense:  60 capsule    Refill:  0    I personally performed the services described in this documentation, which was scribed in my presence. The recorded information has been reviewed and considered, and addended by me as needed.  Delman Cheadle, MD MPH

## 2015-03-16 ENCOUNTER — Encounter: Payer: Self-pay | Admitting: Cardiology

## 2015-03-18 DIAGNOSIS — M5442 Lumbago with sciatica, left side: Secondary | ICD-10-CM | POA: Diagnosis not present

## 2015-03-18 DIAGNOSIS — M545 Low back pain: Secondary | ICD-10-CM | POA: Diagnosis not present

## 2015-03-20 ENCOUNTER — Ambulatory Visit
Admission: RE | Admit: 2015-03-20 | Discharge: 2015-03-20 | Disposition: A | Discharge status: Home / Self Care | Payer: 59 | Source: Ambulatory Visit | Attending: Family Medicine | Admitting: Family Medicine

## 2015-03-20 DIAGNOSIS — M5126 Other intervertebral disc displacement, lumbar region: Secondary | ICD-10-CM | POA: Diagnosis not present

## 2015-03-20 DIAGNOSIS — M5442 Lumbago with sciatica, left side: Secondary | ICD-10-CM

## 2015-03-23 ENCOUNTER — Telehealth: Payer: Self-pay

## 2015-03-23 ENCOUNTER — Ambulatory Visit (INDEPENDENT_AMBULATORY_CARE_PROVIDER_SITE_OTHER): Payer: 59 | Admitting: Family Medicine

## 2015-03-23 VITALS — BP 128/90 | HR 86 | Temp 99.3°F | Resp 18 | Ht 71.75 in | Wt 283.4 lb

## 2015-03-23 DIAGNOSIS — M5136 Other intervertebral disc degeneration, lumbar region: Secondary | ICD-10-CM | POA: Diagnosis not present

## 2015-03-23 DIAGNOSIS — M5432 Sciatica, left side: Secondary | ICD-10-CM

## 2015-03-23 DIAGNOSIS — M5386 Other specified dorsopathies, lumbar region: Secondary | ICD-10-CM

## 2015-03-23 MED ORDER — TRAMADOL HCL 50 MG PO TABS: 50.0000 mg | ORAL_TABLET | Freq: Four times a day (QID) | ORAL | Status: DC | PRN

## 2015-03-23 MED ORDER — PREDNISONE 20 MG PO TABS
ORAL_TABLET | ORAL | Status: DC
Start: 2015-03-23 — End: 2015-04-30

## 2015-03-23 MED ORDER — CYCLOBENZAPRINE HCL 10 MG PO TABS: 10.0000 mg | ORAL_TABLET | Freq: Three times a day (TID) | ORAL | Status: DC | PRN

## 2015-03-23 NOTE — Patient Instructions (Signed)
Do not use any other otc pain medication other than tylenol/acetaminophen - so no aleve, ibuprofen, motrin, advil, etc. Do not overdue it on the indomethacin as that can cause stomach upset and elevated blood pressure. Do not use the indomethacin while you are on the prednisone.

## 2015-03-23 NOTE — Telephone Encounter (Signed)
[employer] needs a provider statement form filled out and faxed back to them for this patients FMLA. I have filled out what I could from the OV notes and highlighted the areas that need to be completed. I will place in Dr. Alver Fisher box on 03/23/15 please return to the FMLA/Disability box at the one check out desk within 5-7 business day. Thank you!

## 2015-03-23 NOTE — Progress Notes (Signed)
Subjective:  By signing my name below, I, Moises Blood, attest that this documentation has been prepared under the direction and in the presence of Delman Cheadle, MD. Electronically Signed: Moises Blood, [place]. 03/23/2015 , 4:35 PM .  Patient was seen in Room 2 .   Patient ID: Victor Jensen, male    DOB: 23-Sep-1985, 30 y.o.   MRN: [id number] Chief Complaint  Patient presents with  . Follow-up    for back pain. and wants to discuss MRI results.    HPI Victor Jensen is a 30 y.o. male who presents to [facility] for follow up on left-sided low back pain.  Had an appointment on 2/23 with Dr. Rip Harbour at [facility]. Started on physical therapy that starts this week. He tried walking on the treadmill at the gym 2 days ago, but was only able to go for about 10 minutes due to pain radiating down to his legs. He has recheck with Dr. Rip Harbour in a month. When he sits too long, he feels some numbness in his legs.   He also wants to discuss his MRI results. There is a small annular tear at L4-5 in the foramen on the left.   He did a lot of walking and some light lifting at work. He felt even worse and had to take more medications. There were some miscommunications between the patient and his work for his time off.   Past Medical History  Diagnosis Date  . Allergy    Prior to Admission medications   Medication Sig Start Date End Date Taking? Authorizing Provider  Blood Pressure Monitoring (ADULT BLOOD PRESSURE CUFF LG) KIT 1 kit by Does not apply route daily. 03/12/15  Yes Minus Breeding, MD  cyclobenzaprine (FLEXERIL) 10 MG tablet Take 1 tablet (10 mg total) by mouth 3 (three) times daily as needed for muscle spasms. 03/08/15  Yes Shawnee Knapp, MD  indomethacin (INDOCIN) 50 MG capsule Take 1 capsule (50 mg total) by mouth 3 (three) times daily with meals. 03/15/15  Yes Shawnee Knapp, MD  traMADol (ULTRAM) 50 MG tablet Take 1 tablet (50 mg total) by mouth every 6 (six) hours as needed. 03/10/15  Yes Shawnee Knapp, MD   No Known Allergies  Review of Systems  Constitutional: Positive for activity change and fatigue. Negative for fever and chills.  Gastrointestinal: Negative for nausea and vomiting.  Musculoskeletal: Positive for myalgias, back pain and arthralgias. Negative for gait problem.  Skin: Negative for rash and wound.  Neurological: Positive for weakness and numbness.      Objective:   Physical Exam  Constitutional: He is oriented to person, place, and time. He appears well-developed and well-nourished. No distress.  HENT:  Head: Normocephalic and atraumatic.  Eyes: EOM are normal. Pupils are equal, round, and reactive to light.  Neck: Neck supple.  Cardiovascular: Normal rate.   Pulmonary/Chest: Effort normal. No respiratory distress.  Musculoskeletal: Normal range of motion.  Plantar flexion and dorsal flexion 4+/5 on left Tenderness to palpation over lower spinous process with severe restriction in flexion approximately 45 degrees  Neurological: He is alert and oriented to person, place, and time.  Unable to explicit lower DTR's Positive straight leg raise on left  Skin: Skin is warm and dry.  Psychiatric: He has a normal mood and affect. His behavior is normal.  Nursing note and vitals reviewed.   BP 128/90 mmHg  Pulse 86  Temp(Src) 99.3 F (37.4 C) (Oral)  Resp 18  Ht 5' 11.75" (1.822 m)  Wt 283 lb 6.4 oz (128.549 kg)  BMI 38.72 kg/m2  SpO2 98%    Assessment & Plan:   1. Annular tear of lumbar disc   2. Sciatica of left side associated with disorder of lumbar spine   Worker's comp case.   Unfortunately, I advised pt that he go back to work on light duty last wk which he did but was only able to tolerate for one day - the stress of the constant moving, walking, very light lifting (even though he did completely comply with all of my duty restrictions) flaired his sxs and has likely exacerbated the annular tear seen on MRI which is likely causing irritation of the  left L4 nerve root causing the sciatica. Refilled meds, oow and follow up with ortho for further management and release back to work when indicated.  Meds ordered this encounter  Medications  . cyclobenzaprine (FLEXERIL) 10 MG tablet    Sig: Take 1 tablet (10 mg total) by mouth 3 (three) times daily as needed for muscle spasms.    Dispense:  60 tablet    Refill:  0  . traMADol (ULTRAM) 50 MG tablet    Sig: Take 1 tablet (50 mg total) by mouth every 6 (six) hours as needed.    Dispense:  60 tablet    Refill:  0  . predniSONE (DELTASONE) 20 MG tablet    Sig: Take 4 tabs po qd x 2d, 3 tabs po qd x 2d, 2 tabs po qd x 2d, 1 tab po qd x 2d    Dispense:  20 tablet    Refill:  0    I personally performed the services described in this documentation, which was scribed in my presence. The recorded information has been reviewed and considered, and addended by me as needed.  Delman Cheadle, MD MPH

## 2015-03-26 ENCOUNTER — Ambulatory Visit (HOSPITAL_BASED_OUTPATIENT_CLINIC_OR_DEPARTMENT_OTHER): Payer: 59 | Attending: Cardiology | Admitting: Radiology

## 2015-03-26 VITALS — Ht 71.0 in | Wt 283.0 lb

## 2015-03-26 DIAGNOSIS — G4733 Obstructive sleep apnea (adult) (pediatric): Secondary | ICD-10-CM | POA: Diagnosis not present

## 2015-03-26 DIAGNOSIS — F515 Nightmare disorder: Secondary | ICD-10-CM | POA: Insufficient documentation

## 2015-03-26 DIAGNOSIS — R0683 Snoring: Secondary | ICD-10-CM | POA: Insufficient documentation

## 2015-03-26 DIAGNOSIS — G478 Other sleep disorders: Secondary | ICD-10-CM | POA: Diagnosis not present

## 2015-03-29 ENCOUNTER — Encounter (HOSPITAL_BASED_OUTPATIENT_CLINIC_OR_DEPARTMENT_OTHER): Payer: 59

## 2015-03-30 NOTE — Telephone Encounter (Signed)
Patient called requesting his FMLA forms stated he saw Dr Clelia CroftShaw on 03/23/15 and she told him that she had paperwork and he doesn't understand why it wasn't completed that day, I tried to explain that they have 5-7 business days to complete and that Dr Clelia CroftShaw was still within those days. He was very upset but I let him know that I would put a message in and let Dr. Clelia CroftShaw know to see if I could get the paperwork as soon as possible. So have we completed these forms?

## 2015-03-30 NOTE — Telephone Encounter (Signed)
I did not have the papers at the time of his visit.  I already completed FMLA forms for pt on 03/10/15 that are scanned in - can we please copy the info over from those as much as possible then put on the top of my box and I will complete the rest and sign tomorrow?  Thanks for your help with this.    Pt's care has been transferred to orthopedics so as far as when he will be released to go back and how long he is out of work is going to be up to them but as far as I know, nothing about his case has sig changed from the papers originally done on 2/15.

## 2015-03-31 NOTE — Telephone Encounter (Signed)
Forms done today.

## 2015-04-01 ENCOUNTER — Encounter: Payer: Self-pay | Admitting: Cardiology

## 2015-04-01 ENCOUNTER — Ambulatory Visit (HOSPITAL_COMMUNITY): Admission: RE | Admit: 2015-04-01 | Payer: 59 | Source: Ambulatory Visit | Admitting: Cardiology

## 2015-04-13 DIAGNOSIS — M5442 Lumbago with sciatica, left side: Secondary | ICD-10-CM | POA: Diagnosis not present

## 2015-04-16 ENCOUNTER — Ambulatory Visit (HOSPITAL_COMMUNITY)
Admission: RE | Admit: 2015-04-16 | Discharge: 2015-04-16 | Disposition: A | Discharge status: Home / Self Care | Payer: 59 | Source: Ambulatory Visit | Attending: Cardiology | Admitting: Cardiology

## 2015-04-16 DIAGNOSIS — I517 Cardiomegaly: Secondary | ICD-10-CM | POA: Insufficient documentation

## 2015-04-16 DIAGNOSIS — I081 Rheumatic disorders of both mitral and tricuspid valves: Secondary | ICD-10-CM | POA: Insufficient documentation

## 2015-04-16 LAB — CREATININE, SERUM: Creatinine, Ser: 1.33 mg/dL — ABNORMAL HIGH (ref 0.61–1.24)

## 2015-04-16 MED ORDER — GADOBENATE DIMEGLUMINE 529 MG/ML IV SOLN
38.0000 mL | Freq: Once | INTRAVENOUS | Status: AC | PRN
  Administered 2015-04-16: 38 mL via INTRAVENOUS

## 2015-04-19 NOTE — Sleep Study (Signed)
Patient Name: Victor Jensen, Chui Date: 03/26/2015 Gender: Male D.O.B: 08-10-1985 Age (years): 30 Referring Provider: Minus Breeding Height (inches): 70 Interpreting Physician: Shelva Majestic MD, ABSM Weight (lbs): 283 RPSGT: Zadie Rhine BMI: 41 MRN: [id number] Neck Size: 18.50  CLINICAL INFORMATION Sleep Study Type: NPSG Indication for sleep study: Snoring Epworth Sleepiness Score: 10  SLEEP STUDY TECHNIQUE As per the AASM Manual for the Scoring of Sleep and Associated Events v2.3 (April 2016) with a hypopnea requiring 4% desaturations. The channels recorded and monitored were frontal, central and occipital EEG, electrooculogram (EOG), submentalis EMG (chin), nasal and oral airflow, thoracic and abdominal wall motion, anterior tibialis EMG, snore microphone, electrocardiogram, and pulse oximetry.  MEDICATIONS  Blood Pressure Monitoring (ADULT BLOOD PRESSURE CUFF LG) KIT 1 kit, Daily     cyclobenzaprine (FLEXERIL) 10 MG tablet 10 mg, 3 times daily PRN     indomethacin (INDOCIN) 50 MG capsule 50 mg, 3 times daily with meals     predniSONE (DELTASONE) 20 MG tablet      traMADol (ULTRAM) 50 MG tablet   Medications self-administered by patient during sleep study : No sleep medicine administered.  SLEEP ARCHITECTURE The study was initiated at 10:22:33 PM and ended at 4:40:03 AM. Sleep onset time was 24.0 minutes and the sleep efficiency was 76.7%. The total sleep time was 289.5 minutes. Wake after sleep onset (WASO) was 64 minutes. Stage REM latency was 43.0 minutes. The patient spent 4.66% of the night in stage N1 sleep, 54.23% in stage N2 sleep, 0.35% in stage N3 and 40.76% in REM. Alpha intrusion was absent. Supine sleep was 49.05%.  RESPIRATORY PARAMETERS The overall apnea/hypopnea index (AHI) was 3.1 per hour. There were 1 total apneas, including 1 obstructive, 0 central and 0 mixed apneas. There were 14 hypopneas and 19 RERAs. The AHI during Stage REM sleep was  6.6 per hour. AHI while supine was 1.7 per hour. The mean oxygen saturation was 93.13%. The minimum SpO2 during sleep was 85.00%. Loud snoring was noted during this study.  CARDIAC DATA The 2 lead EKG demonstrated sinus rhythm. The mean heart rate was 62.88 beats per minute. Other EKG findings include: None.  LEG MOVEMENT DATA The total PLMS were 0 with a resulting PLMS index of 0.00. Associated arousal with leg movement index was 0.0 .  IMPRESSIONS - Probable increased upper airway resistance syndrome (UARS) with overall AHI of 3.1/h; however, there was mild sleep apnea with REM sleep (AHI 6.6/h) - No significant central sleep apnea occurred during this study (CAI = 0.0/h). - Mild-moderate oxygen desaturation to a nadir of 85.00%. Total time spent < 90% saturation was 5.1% - Loud snoring volume. - No cardiac abnormalities were noted during this study. - The arousal index was abnormal. - Clinically significant periodic limb movements did not occur during sleep. No significant associated arousals. - The patient had a significant nightmare and was screaming loudly during the episode.   DIAGNOSIS - obstructive sleep apnea  RECOMMENDATIONS - At present patient does not meet criteria for CPAP initiation; however, consider alternatives for the treatment of snoring, UARS and very mild OSA during REM sleep. - Efforts should be made to optimize nasal and oropharyngeal patency. - Avoid alcohol, sedatives and other CNS depressants that may worsen sleep apnea and disrupt normal sleep architecture. - Sleep hygiene should be reviewed to assess factors that may improve sleep quality. - Weight management (BMI 41) and regular exercise should be initiated or continued if appropriate. - Consider f/u testing in  the future if symptoms progress.   Troy Sine, MD, [place], American Board of Sleep Medicine  ELECTRONICALLY SIGNED ON:  04/19/2015, 6:25 PM [place] PH:  [phone]   FX: [phone] [place]

## 2015-04-22 ENCOUNTER — Telehealth: Payer: Self-pay | Admitting: *Deleted

## 2015-04-22 ENCOUNTER — Telehealth: Payer: Self-pay | Admitting: Cardiology

## 2015-04-22 NOTE — Telephone Encounter (Signed)
Left message to return a call to discuss sleep study results. 

## 2015-04-22 NOTE — Telephone Encounter (Signed)
Victor Jensen is returning your call

## 2015-04-22 NOTE — Progress Notes (Signed)
Left message to return a call to discuss results. 

## 2015-04-24 DIAGNOSIS — Z87898 Personal history of other specified conditions: Secondary | ICD-10-CM

## 2015-04-24 DIAGNOSIS — Z8669 Personal history of other diseases of the nervous system and sense organs: Secondary | ICD-10-CM

## 2015-04-24 HISTORY — DX: Personal history of other specified conditions: Z87.898

## 2015-04-24 HISTORY — DX: Personal history of other diseases of the nervous system and sense organs: Z86.69

## 2015-04-26 NOTE — Telephone Encounter (Signed)
F/u  Pt returning RNphone call- test restults. Please call back and discuss.   

## 2015-04-28 ENCOUNTER — Telehealth: Payer: Self-pay | Admitting: *Deleted

## 2015-04-28 NOTE — Telephone Encounter (Signed)
Returning your call. °

## 2015-04-28 NOTE — Telephone Encounter (Signed)
Returning call.

## 2015-04-29 NOTE — Progress Notes (Signed)
Cardiology Office Note   Date:  04/30/2015   ID:  Victor MundaJonathan D Schorsch, DOB 07-17-1985, MRN [id number]  PCP:  No PCP Per Patient  Cardiologist:   Rollene RotundaJames Wagner Tanzi, MD   No chief complaint on file.     History of Present Illness Victor Jensen is a 30 y.o. male who presents for evaluation of an abnormal EKG and chest pain. He has no past cardiac history. I saw him recently he developed some shortness of breath.   I sent him for a dobutamine echo because he has a markedly abnormal EKG.  With this he had a hypertensive blood pressure response. He also had concentric LVH.  MRI demonstrated a well preserved ejection fraction with mild concentric LVH and no infiltration. I sent him for sleep study but he didn't sleep very well at night. There was no obvious apnea. He's now started changing his lifestyle. He is eating better. He is exercising routinely. His breathing is slightly better. He's not having chest discomfort. He's not having PND or orthopnea. He's not having palpitations, presyncope or syncope. Unfortunately he works a night shift his sleep is very difficult.  Past Medical History  Diagnosis Date  . Allergy     Past Surgical History  Procedure Laterality Date  . None       No current outpatient prescriptions on file.   No current facility-administered medications for this visit.    Allergies:   Review of patient's allergies indicates no known allergies.    ROS:  Please see the history of present illness.   Otherwise, review of systems are positive for poor sleeping habits, tiredness, snoring.   All other systems are reviewed and negative.    PHYSICAL EXAM: VS:  BP 120/92 mmHg  Pulse 58  Ht 6' (1.829 m)  Wt 278 lb 4 oz (126.213 kg)  BMI 37.73 kg/m2  SpO2 95% , BMI Body mass index is 37.73 kg/(m^2). GENERAL:  Well appearing HEENT:  Pupils equal round and reactive, fundi not visualized, oral mucosa unremarkable NECK:  No jugular venous distention, waveform within  normal limits, carotid upstroke brisk and symmetric, no bruits, no thyromegaly LUNGS:  Clear to auscultation bilaterally BACK:  No CVA tenderness CHEST:  Unremarkable HEART:  PMI not displaced or sustained,S1 and S2 within normal limits, no S3, no S4, no clicks, no rubs, no murmurs ABD:  Flat, positive bowel sounds normal in frequency in pitch, no bruits, no rebound, no guarding, no midline pulsatile mass, no hepatomegaly, no splenomegaly EXT:  2 plus pulses throughout, no edema, no cyanosis no clubbing     EKG:  EKG is not ordered today.   Recent Labs: 11/14/2014: ALT 22; BUN 7; Hemoglobin 14.7; Platelets 207; Potassium 4.0; Sodium 135 04/16/2015: Creatinine, Ser 1.33*    Lipid Panel No results found for: CHOL, TRIG, HDL, CHOLHDL, VLDL, LDLCALC, LDLDIRECT    Wt Readings from Last 3 Encounters:  04/30/15 278 lb 4 oz (126.213 kg)  03/26/15 283 lb (128.368 kg)  03/23/15 283 lb 6.4 oz (128.549 kg)      Other studies Reviewed: Additional studies/ records that were reviewed today include: ED records. Review of the above records demonstrates:  Please see elsewhere in the note.     ASSESSMENT AND PLAN:  Abnormal EKG:  This is probably related to some occult hypertension as he did have a hypertensive response on his treadmill. However, I think this could be treated with lifestyle spell long time talking about this. No further imaging  is indicated. I would like to see him back in one year to further evaluate however.  HTN:   Her manage this with lifestyle modification.  POOR SLEEP:  We talked a lot about sleep hygiene. I also suggested over-the-counter melatonin.  OVERWEIGHT:  I'm excited because he is quite committed to healthy eating exercising. We discussed this at length.  Current medicines are reviewed at length with the patient today.  The patient does not have concerns regarding medicines.  The following changes have been made:  no change  Labs/ tests ordered today  include:      No orders of the defined types were placed in this encounter.     Disposition:   FU with me in 12 months.    Signed, Rollene Rotunda, MD  04/30/2015 9:56 AM    [facility]

## 2015-04-30 ENCOUNTER — Encounter: Payer: Self-pay | Admitting: Cardiology

## 2015-04-30 ENCOUNTER — Ambulatory Visit (INDEPENDENT_AMBULATORY_CARE_PROVIDER_SITE_OTHER): Payer: 59 | Admitting: Cardiology

## 2015-04-30 VITALS — BP 120/92 | HR 58 | Ht 72.0 in | Wt 278.2 lb

## 2015-04-30 DIAGNOSIS — R9431 Abnormal electrocardiogram [ECG] [EKG]: Secondary | ICD-10-CM | POA: Diagnosis not present

## 2015-04-30 NOTE — Patient Instructions (Signed)
Your physician wants you to follow-up in: 1 Year. You will receive a reminder letter in the mail two months in advance. If you don't receive a letter, please call our office to schedule the follow-up appointment.  Melatonin for sleep

## 2015-05-17 DIAGNOSIS — R1012 Left upper quadrant pain: Secondary | ICD-10-CM | POA: Diagnosis not present

## 2015-05-17 DIAGNOSIS — R160 Hepatomegaly, not elsewhere classified: Secondary | ICD-10-CM | POA: Diagnosis not present

## 2015-05-17 MED FILL — CYCLOBENZAPRINE 5 MG TABLET: 5 | 7 days supply | Qty: 21 | Fill #0

## 2015-05-17 MED FILL — NAPROXEN 500 MG TABLET: 500 | 15 days supply | Qty: 30 | Fill #0

## 2015-05-17 MED FILL — traMADol HCL 50 MG TABS: 50 | 10 days supply | Qty: 40 | Fill #0

## 2015-05-26 ENCOUNTER — Telehealth: Payer: Self-pay | Admitting: *Deleted

## 2015-05-26 NOTE — Telephone Encounter (Signed)
-----   Message from Lennette Biharihomas A Kelly, MD sent at 04/19/2015  6:50 PM EDT ----- Burna MortimerWanda Please notify pt with results; at present no CPAP but may need in future with UARS, loud snoring, and very mild OSA with REM sleep.

## 2015-05-26 NOTE — Telephone Encounter (Signed)
Patient given Sleep study results and recommendations. Sleep hygiene sheet mailed to patient.

## 2015-05-26 NOTE — Telephone Encounter (Signed)
Called patient to confirm that he received his sleep study results at his last office appointment with Dr Antoine PocheHochrein. He states that he did discuss this, however I went over it in detail. Sleep hygiene sheet mailed to patient.

## 2015-06-24 HISTORY — PX: NO PAST SURGERIES: SHX2092

## 2015-07-23 ENCOUNTER — Ambulatory Visit (INDEPENDENT_AMBULATORY_CARE_PROVIDER_SITE_OTHER): Payer: 59 | Admitting: Medical

## 2015-07-23 ENCOUNTER — Encounter: Payer: Self-pay | Admitting: Medical

## 2015-07-23 VITALS — BP 130/70 | HR 84 | Resp 16 | Ht 70.75 in | Wt 279.6 lb

## 2015-07-23 DIAGNOSIS — E790 Hyperuricemia without signs of inflammatory arthritis and tophaceous disease: Secondary | ICD-10-CM

## 2015-07-23 DIAGNOSIS — E669 Obesity, unspecified: Secondary | ICD-10-CM

## 2015-07-23 DIAGNOSIS — R7301 Impaired fasting glucose: Secondary | ICD-10-CM

## 2015-07-23 DIAGNOSIS — R748 Abnormal levels of other serum enzymes: Secondary | ICD-10-CM | POA: Diagnosis not present

## 2015-07-23 DIAGNOSIS — I517 Cardiomegaly: Secondary | ICD-10-CM

## 2015-07-23 DIAGNOSIS — R9431 Abnormal electrocardiogram [ECG] [EKG]: Secondary | ICD-10-CM | POA: Insufficient documentation

## 2015-07-23 DIAGNOSIS — Z0000 Encounter for general adult medical examination without abnormal findings: Secondary | ICD-10-CM | POA: Diagnosis not present

## 2015-07-23 DIAGNOSIS — Z833 Family history of diabetes mellitus: Secondary | ICD-10-CM | POA: Diagnosis not present

## 2015-07-23 DIAGNOSIS — R7989 Other specified abnormal findings of blood chemistry: Secondary | ICD-10-CM | POA: Diagnosis not present

## 2015-07-23 DIAGNOSIS — Z8249 Family history of ischemic heart disease and other diseases of the circulatory system: Secondary | ICD-10-CM | POA: Diagnosis not present

## 2015-07-23 DIAGNOSIS — G479 Sleep disorder, unspecified: Secondary | ICD-10-CM | POA: Diagnosis not present

## 2015-07-23 LAB — POCT URINALYSIS DIPSTICK
BILIRUBIN UA: NEGATIVE
GLUCOSE UA: NEGATIVE
Ketones, UA: NEGATIVE
LEUKOCYTES UA: NEGATIVE
NITRITE UA: NEGATIVE
PH UA: 6
Protein, UA: NEGATIVE
RBC UA: NEGATIVE
Spec Grav, UA: >=1.03
Urobilinogen, UA: NEGATIVE

## 2015-07-23 LAB — CBC
HCT: 42.7 % (ref 38.5–50.0)
Hemoglobin: 14.5 g/dL (ref 13.2–17.1)
MCH: 26.2 pg — ABNORMAL LOW (ref 27.0–33.0)
MCHC: 34 g/dL (ref 32.0–36.0)
MCV: 77.2 fL — ABNORMAL LOW (ref 80.0–100.0)
MPV: 9.4 fL (ref 7.5–12.5)
Platelets: 208 10*3/uL (ref 140–400)
RBC: 5.53 MIL/uL (ref 4.20–5.80)
RDW: 14.3 % (ref 11.0–15.0)
WBC: 6.7 10*3/uL (ref 4.0–10.5)

## 2015-07-23 LAB — URIC ACID: Uric Acid, Serum: 8.8 mg/dL — ABNORMAL HIGH (ref 4.0–8.0)

## 2015-07-23 LAB — COMPREHENSIVE METABOLIC PANEL
ALT: 23 U/L (ref 9–46)
AST: 32 U/L (ref 10–40)
Albumin: 4.8 g/dL (ref 3.6–5.1)
Alkaline Phosphatase: 60 U/L (ref 40–115)
BILIRUBIN TOTAL: 0.7 mg/dL (ref 0.2–1.2)
BUN: 15 mg/dL (ref 7–25)
CO2: 25 mmol/L (ref 20–31)
CREATININE: 1.21 mg/dL (ref 0.60–1.35)
Calcium: 9.7 mg/dL (ref 8.6–10.3)
Chloride: 100 mmol/L (ref 98–110)
GLUCOSE: 86 mg/dL (ref 65–99)
Potassium: 3.9 mmol/L (ref 3.5–5.3)
SODIUM: 138 mmol/L (ref 135–146)
Total Protein: 7.6 g/dL (ref 6.1–8.1)

## 2015-07-23 LAB — HEMOGLOBIN A1C
HEMOGLOBIN A1C: 5.6 % (ref <5.7)
MEAN PLASMA GLUCOSE: 114 mg/dL

## 2015-07-23 LAB — LIPID PANEL
Cholesterol: 180 mg/dL (ref 125–200)
HDL: 53 mg/dL (ref >=40)
LDL CALC: 116 mg/dL (ref <130)
Total CHOL/HDL Ratio: 3.4 Ratio (ref <=5.0)
Triglycerides: 56 mg/dL (ref <150)
VLDL: 11 mg/dL (ref <30)

## 2015-07-23 LAB — TSH: TSH: 1.27 m[IU]/L (ref 0.40–4.50)

## 2015-07-23 NOTE — Progress Notes (Signed)
Subjective:   HPI  Victor Jensen is a 30 y.o. male who presents for a complete physical.  Concerns: Has hx/o prior back issues that took him out of work 2 months.   Sees back specialist for this.  Has used flexeril and ultram for this.   Had scare with chest pain and EKG abnormal in 01/2015, has had subsequent echo, stress test, cardiac MRI all this year (reviewed in EMR).  Reviewed their medical, surgical, family, social, medication, and allergy history and updated chart as appropriate.  Past Medical History  Diagnosis Date  . Allergy   . Allergic rhinitis   . Family history of premature CAD     father, died MI in early 7440s  . H/O cardiovascular stress test 01/2015    LVH, EF 65-70%, but other imaging advised, Dr. Antoine PocheHochrein  . History of MRI of chest     cardiac MRI.  Concentric LVH, mildly dilated LV. Dr. Antoine PocheHochrein  . History of sleep disturbance 04/2015    equivocal sleep study 04/2015  . Obesity     Past Surgical History  Procedure Laterality Date  . None    . No past surgeries  06/2015    Social History   Social History  . Marital Status: Married    Spouse Name: N/A  . Number of Children: 2  . Years of Education: N/A   Occupational History  . Not on file.   Social History Main Topics  . Smoking status: Former Smoker -- 0.50 packs/day for 3 years  . Smokeless tobacco: Never Used  . Alcohol Use: 0.0 oz/week    0 Standard drinks or equivalent per week     Comment: occasional  . Drug Use: Yes    Special: Marijuana     Comment: rare  . Sexual Activity: Not on file   Other Topics Concern  . Not on file   Social History Narrative   Works for [employer], works at [employer].  Lives with two kids and wife.  Exercise - cardio, weights, 5 days per week.   As of 06/2015    Family History  Problem Relation Age of Onset  . Heart attack Father 2040  . Diabetes Father     Severe  . Heart disease Father 6942  . Diabetes Paternal Grandfather   . Cancer Other    . Diabetes Maternal Grandfather   . Heart disease Maternal Grandfather   . Diabetes Maternal Grandmother     No current outpatient prescriptions on file.  No Known Allergies   Review of Systems Constitutional: -fever, -chills, -sweats, -unexpected weight change, -decreased appetite, -fatigue Allergy: -sneezing, -itching, -congestion Dermatology: -changing moles, --rash, -lumps ENT: -runny nose, -ear pain, -sore throat, -hoarseness, -sinus pain, -teeth pain, - ringing in ears, -hearing loss, -nosebleeds Cardiology: -chest pain, -palpitations, -swelling, -difficulty breathing when lying flat, -waking up short of breath Respiratory: -cough, -shortness of breath, -difficulty breathing with exercise or exertion, -wheezing, -coughing up blood Gastroenterology: -abdominal pain, -nausea, -vomiting, -diarrhea, -constipation, -blood in stool, -changes in bowel movement, -difficulty swallowing or eating Hematology: -bleeding, -bruising  Musculoskeletal: -joint aches, -muscle aches, -joint swelling, +back pain, -neck pain, -cramping, -changes in gait Ophthalmology: denies vision changes, eye redness, itching, discharge Urology: -burning with urination, -difficulty urinating, -blood in urine, -urinary frequency, -urgency, -incontinence Neurology: -headache, -weakness, -tingling, -numbness, -memory loss, -falls, -dizziness Psychology: -depressed mood, -agitation, -sleep problems     Objective:   Physical Exam  BP 130/70 mmHg  Pulse 84  Resp  16  Ht 5' 10.75" (1.797 m)  Wt 279 lb 9.6 oz (126.826 kg)  BMI 39.27 kg/m2  General appearance: alert, no distress, WD/WN, AA male Skin: tattoos bilat forearms, neck, scattered macules, moderate facial acne, no worrisome lesions HEENT: normocephalic, conjunctiva/corneas normal, sclerae anicteric, PERRLA, EOMi, nares patent, no discharge or erythema, pharynx normal Oral cavity: MMM, tongue normal, teeth upper plate, lower in good repair Neck: supple,  no lymphadenopathy, no thyromegaly, no masses, normal ROM, no bruits Chest: non tender, normal shape and expansion Heart: RRR, normal S1, S2, no murmurs Lungs: CTA bilaterally, no wheezes, rhonchi, or rales Abdomen: +bs, soft, non tender, non distended, no masses, no hepatomegaly, no splenomegaly, no bruits Back: non tender, normal ROM, no scoliosis Musculoskeletal: upper extremities non tender, no obvious deformity, normal ROM throughout, lower extremities non tender, no obvious deformity, normal ROM throughout Extremities: no edema, no cyanosis, no clubbing Pulses: 2+ symmetric, upper and lower extremities, normal cap refill Neurological: alert, oriented x 3, CN2-12 intact, strength normal upper extremities and lower extremities, sensation normal throughout, DTRs 2+ throughout, no cerebellar signs, gait normal Psychiatric: normal affect, behavior normal, pleasant  GU: normal male external genitalia, circumcised, nontender, no masses, no hernia, no lymphadenopathy Rectal: anus normal appearing, no fissure or hemorrhoids   Assessment and Plan :    Encounter Diagnoses  Name Primary?  . Annual physical exam Yes  . Elevated uric acid in blood   . Impaired fasting blood sugar   . Obesity   . Sleep disturbance   . Nonspecific abnormal electrocardiogram (ECG) (EKG)   . Family history of premature CAD   . LVH (left ventricular hypertrophy)   . Family history of diabetes mellitus   . Elevated serum creatinine    Physical exam - discussed healthy lifestyle, diet, exercise, preventative care, vaccinations, and addressed their concerns.   Had long discussion about diet, healthy lifestyle.   The biggest challenge may be getting his wife on board with healthy diet changes at home/grocery shopping for healthy options.   currently they eat hamburgers, hotdogs, mostly fried foods and sweets often.  Reviewed extensive cardiac records from 01/2015. Labs today screening and other given prior abnormal  lab findings .  See your dentist yearly for routine dental care including hygiene visits twice yearly. advised testicular cancer screening He declines STD screen today. Follow-up pending labs

## 2015-07-24 LAB — MICROALBUMIN / CREATININE URINE RATIO
Creatinine, Urine: 163 mg/dL (ref 20–370)
MICROALB UR: 0.5 mg/dL
Microalb Creat Ratio: 3 mcg/mg creat (ref <30)

## 2015-07-28 ENCOUNTER — Other Ambulatory Visit: Payer: Self-pay | Admitting: Medical

## 2015-07-28 MED ORDER — LOSARTAN POTASSIUM 25 MG PO TABS: 25.0000 mg | ORAL_TABLET | Freq: Every day | ORAL | Status: DC

## 2015-07-28 MED ORDER — ALLOPURINOL 100 MG PO TABS: 100.0000 mg | ORAL_TABLET | Freq: Every day | ORAL | Status: DC

## 2015-11-11 ENCOUNTER — Encounter: Payer: Self-pay | Admitting: Medical

## 2015-11-11 ENCOUNTER — Ambulatory Visit (INDEPENDENT_AMBULATORY_CARE_PROVIDER_SITE_OTHER): Payer: 59 | Admitting: Medical

## 2015-11-11 VITALS — BP 134/86 | HR 65 | Temp 98.0°F | Ht 70.5 in | Wt 279.2 lb

## 2015-11-11 DIAGNOSIS — Z113 Encounter for screening for infections with a predominantly sexual mode of transmission: Secondary | ICD-10-CM | POA: Diagnosis not present

## 2015-11-11 DIAGNOSIS — B029 Zoster without complications: Secondary | ICD-10-CM

## 2015-11-11 DIAGNOSIS — G4700 Insomnia, unspecified: Secondary | ICD-10-CM

## 2015-11-11 MED ORDER — VALACYCLOVIR HCL 1 G PO TABS: 1000.0000 mg | ORAL_TABLET | Freq: Three times a day (TID) | ORAL | 0 refills | Status: DC

## 2015-11-11 MED ORDER — IBUPROFEN 800 MG PO TABS: 800.0000 mg | ORAL_TABLET | Freq: Three times a day (TID) | ORAL | 0 refills | Status: DC | PRN

## 2015-11-11 MED FILL — valACYclovir HCL 1 GM TABS: 1 | 10 days supply | Qty: 30 | Fill #0

## 2015-11-11 MED FILL — IBUPROFEN 800 MG TABLET: 800 | 10 days supply | Qty: 30 | Fill #0

## 2015-11-11 NOTE — Patient Instructions (Signed)

## 2015-11-11 NOTE — Progress Notes (Signed)
Subjective: Chief Complaint  Patient presents with  . Rash    x6 days, L side, burns when touched, radiating to back   Here for rash.  He notes 6 days ago felt pain and burning in left lower abdomen but 3 days later got scattered rash along same area where he has had the pain.   He notes no prior similar rash or symptoms.   He notes no fever, but had recent URI few weeks ago and has been under a lot of stress.  Has work stress, home related stress.  Works 3rd shift and not sleeping well in the day.   He notes several months of not sleeping well.   Works 3rd shift, but in the morning he goes to the gym, eats immediately after, goes to be shortly thereafter.  Usually gest to sleep ok but within 2 hours is awake.  The rest of the night he can only sleep a few hours at a time.  He had chicken pox as a child.    Lives at home with kids, 12yo, Vassie Moment5yo, wife.   Past Medical History:  Diagnosis Date  . Allergic rhinitis   . Allergy   . Family history of premature CAD    father, died MI in early 5040s  . H/O cardiovascular stress test 01/2015   LVH, EF 65-70%, but other imaging advised, Dr. Antoine PocheHochrein  . History of MRI of chest    cardiac MRI.  Concentric LVH, mildly dilated LV. Dr. Antoine PocheHochrein  . History of sleep disturbance 04/2015   equivocal sleep study 04/2015  . Obesity    Current Outpatient Prescriptions on File Prior to Visit  Medication Sig Dispense Refill  . allopurinol (ZYLOPRIM) 100 MG tablet Take 1 tablet (100 mg total) by mouth daily. (Patient not taking: Reported on 11/11/2015) 30 tablet 2  . losartan (COZAAR) 25 MG tablet Take 1 tablet (25 mg total) by mouth daily. (Patient not taking: Reported on 11/11/2015) 30 tablet 2   No current facility-administered medications on file prior to visit.    ROS as in subjective  Objective BP 134/86 (BP Location: Right Arm, Patient Position: Sitting, Cuff Size: Large)   Pulse 65   Temp 98 F (36.7 C) (Oral)   Ht 5' 10.5" (1.791 m)   Wt 279 lb 3.2  oz (126.6 kg)   SpO2 97%   BMI 39.49 kg/m   Gen: wd, wn, nad Skin : left lower abdomen along T10 dermatome with scattered patches of early vesicular lesions on faintly red/pink base.   Assessment: Encounter Diagnoses  Name Primary?  . Herpes zoster without complication Yes  . Screen for STD (sexually transmitted disease)   . Insomnia, unspecified type     Plan: discussed shingles diagnosis, symptoms, treatment, possible triggers.  discussed preventing spread to others, discussed precautions.  F/u if worse or not improving in the next 2 weeks  Screen for STD to rule other potential triggers of shingles outbreak  Insomnia- discussed sleep hygiene measures including nothing to eat or drink within 2 hours of bedtime, consistence bedtime, good sleep environment among other sleep measures.  Can try benadryl or other OTC sleep aid.  Recheck if not seeing improvement.   Christiane HaJonathan was seen today for rash.  Diagnoses and all orders for this visit:  Herpes zoster without complication -     HIV antibody -     RPR -     GC/Chlamydia Probe Amp  Screen for STD (sexually transmitted disease) -  HIV antibody -     RPR -     GC/Chlamydia Probe Amp  Insomnia, unspecified type  Other orders -     valACYclovir (VALTREX) 1000 MG tablet; Take 1 tablet (1,000 mg total) by mouth 3 (three) times daily. -     ibuprofen (ADVIL,MOTRIN) 800 MG tablet; Take 1 tablet (800 mg total) by mouth every 8 (eight) hours as needed.

## 2015-11-12 LAB — RPR

## 2015-11-12 LAB — GC/CHLAMYDIA PROBE AMP
CT Probe RNA: NOT DETECTED
GC PROBE AMP APTIMA: NOT DETECTED

## 2015-11-12 LAB — HIV ANTIBODY (ROUTINE TESTING W REFLEX): HIV: NONREACTIVE

## 2015-12-15 ENCOUNTER — Ambulatory Visit (INDEPENDENT_AMBULATORY_CARE_PROVIDER_SITE_OTHER): Payer: 59 | Admitting: Medical

## 2015-12-15 ENCOUNTER — Encounter: Payer: Self-pay | Admitting: Medical

## 2015-12-15 ENCOUNTER — Telehealth: Payer: Self-pay

## 2015-12-15 VITALS — BP 132/70 | HR 89 | Wt 280.6 lb

## 2015-12-15 DIAGNOSIS — L989 Disorder of the skin and subcutaneous tissue, unspecified: Secondary | ICD-10-CM

## 2015-12-15 DIAGNOSIS — L219 Seborrheic dermatitis, unspecified: Secondary | ICD-10-CM

## 2015-12-15 NOTE — Telephone Encounter (Signed)
Called l/m about appt. With [facility], for 12/30/15 @ 10:30am

## 2015-12-15 NOTE — Progress Notes (Signed)
Subjective: Chief Complaint  Patient presents with  . growth on neck    growth on bock of neck pain, drainage   Here for growth on back of neck.  He normally comes in with long dreadlocks obscuring his scalp, but he decided to be evaluated for this growth. Has had growth and back of scalp for many years. It started many years ago as a small bump but over time has become a large tender thick mass that sometimes bleeds and sometimes drains pus.    Has never seen a doctor for this.   Has used iodine and other OTC creams with no success.  No other aggravating or relieving factors. No other complaint.  Past Medical History:  Diagnosis Date  . Allergic rhinitis   . Allergy   . Family history of premature CAD    father, died MI in early 2940s  . H/O cardiovascular stress test 01/2015   LVH, EF 65-70%, but other imaging advised, Dr. Antoine PocheHochrein  . History of MRI of chest    cardiac MRI.  Concentric LVH, mildly dilated LV. Dr. Antoine PocheHochrein  . History of sleep disturbance 04/2015   equivocal sleep study 04/2015  . Obesity    No current outpatient prescriptions on file prior to visit.   No current facility-administered medications on file prior to visit.    ROS as in subjective  Objective: BP 132/70   Pulse 89   Wt 280 lb 9.6 oz (127.3 kg)   SpO2 96%   BMI 39.69 kg/m   Gen: wd, wn,nad Skin: at base of occiput within hairline with large raised dense mass, 6cm wide x 4cm height  No other obvious similar scalp lesions   Assessment: Encounter Diagnoses  Name Primary?  . Seborrheic dermatitis of scalp Yes  . Skin lesion      Plan: discussed possible therapies. Will refer to dermatology as there is not much I can do for this.   Christiane HaJonathan was seen today for growth on neck.  Diagnoses and all orders for this visit:  Seborrheic dermatitis of scalp -     Ambulatory referral to Dermatology  Skin lesion -     Ambulatory referral to Dermatology

## 2016-01-21 DIAGNOSIS — L709 Acne, unspecified: Secondary | ICD-10-CM | POA: Diagnosis not present

## 2016-01-21 DIAGNOSIS — L910 Hypertrophic scar: Secondary | ICD-10-CM | POA: Diagnosis not present

## 2016-01-26 MED FILL — CLINDAMYCIN PH 1% SOLUTION: 1 | 60 days supply | Qty: 60 | Fill #0

## 2016-02-18 DIAGNOSIS — L709 Acne, unspecified: Secondary | ICD-10-CM | POA: Diagnosis not present

## 2016-03-17 DIAGNOSIS — L709 Acne, unspecified: Secondary | ICD-10-CM | POA: Diagnosis not present

## 2016-04-14 DIAGNOSIS — L709 Acne, unspecified: Secondary | ICD-10-CM | POA: Diagnosis not present

## 2016-05-12 DIAGNOSIS — L730 Acne keloid: Secondary | ICD-10-CM | POA: Diagnosis not present

## 2016-05-12 MED FILL — CLINDAMYCIN PH 1% SOLUTION: 1 | 30 days supply | Qty: 60 | Fill #0

## 2016-06-23 DIAGNOSIS — L730 Acne keloid: Secondary | ICD-10-CM | POA: Diagnosis not present

## 2016-06-23 MED FILL — CLINDAMYCIN PH 1% SOLUTION: 1 | 30 days supply | Qty: 60 | Fill #0

## 2016-07-21 DIAGNOSIS — L730 Acne keloid: Secondary | ICD-10-CM | POA: Diagnosis not present

## 2016-08-18 DIAGNOSIS — L730 Acne keloid: Secondary | ICD-10-CM | POA: Diagnosis not present

## 2016-09-18 MED FILL — CLINDAMYCIN PH 1% SOLUTION: 1 | 30 days supply | Qty: 60 | Fill #1

## 2016-09-27 ENCOUNTER — Encounter: Payer: Self-pay | Admitting: Medical

## 2016-09-27 ENCOUNTER — Ambulatory Visit (INDEPENDENT_AMBULATORY_CARE_PROVIDER_SITE_OTHER): Payer: 59 | Admitting: Medical

## 2016-09-27 VITALS — BP 128/86 | HR 70 | Wt 266.8 lb

## 2016-09-27 DIAGNOSIS — L03012 Cellulitis of left finger: Secondary | ICD-10-CM

## 2016-09-27 MED ORDER — IBUPROFEN 600 MG PO TABS: 600.0000 mg | ORAL_TABLET | Freq: Three times a day (TID) | ORAL | 0 refills | Status: DC | PRN

## 2016-09-27 MED ORDER — AMOXICILLIN-POT CLAVULANATE 875-125 MG PO TABS: 1.0000 | ORAL_TABLET | Freq: Two times a day (BID) | ORAL | 0 refills | Status: DC

## 2016-09-27 MED FILL — IBUPROFEN 600 MG TABLET: 600 | 10 days supply | Qty: 30 | Fill #0

## 2016-09-27 MED FILL — AMOX TR-K CLV 875-125 MG TA: 875-125 | 10 days supply | Qty: 20 | Fill #0

## 2016-09-27 NOTE — Progress Notes (Signed)
  Subjective:  Victor Jensen is a 31 y.o. male who presents for finger issue. Chief Complaint  Patient presents with  . Hand Pain    finger  swelling and having pain , x2 weeks    Here for c/o 1.5 week hx/o pain in left middle finger.  Denies injury, trauma, but just started hurting out of the blue.    Then after several days started having finger swelling.   Denies fever, no paresthesias, no nausea, no vomiting.  No other aggravating or relieving factors.    No other c/o.  Past Medical History:  Diagnosis Date  . Allergic rhinitis   . Allergy   . Family history of premature CAD    father, died MI in early 3740s  . H/O cardiovascular stress test 01/2015   LVH, EF 65-70%, but other imaging advised, Dr. Antoine PocheHochrein  . History of MRI of chest    cardiac MRI.  Concentric LVH, mildly dilated LV. Dr. Antoine PocheHochrein  . History of sleep disturbance 04/2015   equivocal sleep study 04/2015  . Obesity    No current outpatient prescriptions on file prior to visit.   No current facility-administered medications on file prior to visit.     The following portions of the patient's history were reviewed and updated as appropriate: allergies, current medications, past family history, past medical history, past social history, past surgical history and problem list.  ROS Otherwise as in subjective above  Objective:Exam BP 128/86   Pulse 70   Wt 266 lb 12.8 oz (121 kg)   SpO2 98%   BMI 37.74 kg/m   General appearance: alert, no distress, WD/WN Left 3rd finger lateral corner of nail bed with red swollen quite tender soft tissue without obvious pus pocket Pulses and cap refill of hand normal Fingers and hand neurovascularly intact No obvious open wound   Assessment: Encounter Diagnosis  Name Primary?  . Paronychia of left middle finger Yes     Plan: Discussed symptoms, exam findings, treatment recommendations.  Recommendations:  Begin Augmentin antibiotic twice daily for 10 days  Use  hot soapy salty water soaks for 20 minutes 2-3 times daily for the finger  Use Ibuprofen 600mg  up to 3 times daily for pain and inflammation  Keep the finger clean with soap and water  If a pus pocket develops, return for incision to drain the pus  Hopefully with the above treatment you will see improvement in the next few days  Discussed possible pus pocket, recheck.  Otherwise discussed usual time frame to resolve  Christiane HaJonathan was seen today for hand pain.  Diagnoses and all orders for this visit:  Paronychia of left middle finger  Other orders -     amoxicillin-clavulanate (AUGMENTIN) 875-125 MG tablet; Take 1 tablet by mouth 2 (two) times daily. -     ibuprofen (ADVIL,MOTRIN) 600 MG tablet; Take 1 tablet (600 mg total) by mouth every 8 (eight) hours as needed.    Follow up: prn

## 2016-09-27 NOTE — Patient Instructions (Signed)
Recommendations:  Begin Augmentin antibiotic twice daily for 10 days  Use hot soapy salty water soaks for 20 minutes 2-3 times daily for the finger  Use Ibuprofen 600mg  up to 3 times daily for pain and inflammation  Keep the finger clean with soap and water  If a pus pocket develops, return for incision to drain the pus  Hopefully with the above treatment you will see improvement in the next few days   Paronychia Paronychia is an inflammatory reaction involving the folds of the skin surrounding the fingernail. This is commonly caused by an infection in the skin around a nail. The most common cause of paronychia is frequent wetting of the hands (as seen with bartenders, food servers, nurses or others who wet their hands). This makes the skin around the fingernail susceptible to infection by bacteria (germs) or fungus. Other predisposing factors are:  Aggressive manicuring.   Nail biting.   Thumb sucking.  The most common cause is a staphylococcal (a type of germ) infection, or a fungal (Candida) infection. When caused by a germ, it usually comes on suddenly with redness, swelling, pus and is often painful. It may get under the nail and form an abscess (collection of pus), or form an abscess around the nail. If the nail itself is infected with a fungus, the treatment is usually prolonged and may require oral medicine for up to one year. Your caregiver will determine the length of time treatment is required. The paronychia caused by bacteria (germs) may largely be avoided by not pulling on hangnails or picking at cuticles. When the infection occurs at the tips of the finger it is called felon. When the cause of paronychia is from the herpes simplex virus (HSV) it is called herpetic whitlow. TREATMENT  When an abscess is present treatment is often incision and drainage. This means that the abscess must be cut open so the pus can get out. When this is done, the following home care instructions  should be followed. HOME CARE INSTRUCTIONS   It is important to keep the affected fingers very dry. Rubber or plastic gloves over cotton gloves should be used whenever the hand must be placed in water.   Keep wound clean, dry and dressed as suggested by your caregiver between warm soaks or warm compresses.   Soak in warm water for fifteen to twenty minutes three to four times per day for bacterial infections. Fungal infections are very difficult to treat, so often require treatment for long periods of time.   For bacterial (germ) infections take antibiotics (medicine which kill germs) as directed and finish the prescription, even if the problem appears to be solved before the medicine is gone.   Only take over-the-counter or prescription medicines for pain, discomfort, or fever as directed by your caregiver.  SEEK IMMEDIATE MEDICAL CARE IF:  You have redness, swelling, or increasing pain in the wound.   You notice pus coming from the wound.   You have a fever.   You notice a bad smell coming from the wound or dressing.  Document Released: 07/05/2000 Document Revised: 09/21/2010 Document Reviewed: 03/06/2008 [facility] Patient Information 2012 [employer], [place].

## 2016-10-13 DIAGNOSIS — L730 Acne keloid: Secondary | ICD-10-CM | POA: Diagnosis not present

## 2016-10-27 ENCOUNTER — Encounter: Payer: Self-pay | Admitting: Medical

## 2016-10-27 ENCOUNTER — Ambulatory Visit (INDEPENDENT_AMBULATORY_CARE_PROVIDER_SITE_OTHER): Payer: 59 | Admitting: Medical

## 2016-10-27 VITALS — BP 130/84 | HR 80 | Temp 98.2°F | Wt 266.4 lb

## 2016-10-27 DIAGNOSIS — R21 Rash and other nonspecific skin eruption: Secondary | ICD-10-CM

## 2016-10-27 DIAGNOSIS — H539 Unspecified visual disturbance: Secondary | ICD-10-CM | POA: Diagnosis not present

## 2016-10-27 DIAGNOSIS — R202 Paresthesia of skin: Secondary | ICD-10-CM

## 2016-10-27 DIAGNOSIS — L739 Follicular disorder, unspecified: Secondary | ICD-10-CM

## 2016-10-27 MED ORDER — CLINDAMYCIN PHOS-BENZOYL PEROX 1-5 % EX GEL: Freq: Two times a day (BID) | CUTANEOUS | 0 refills | Status: DC

## 2016-10-27 MED ORDER — CLINDAMYCIN PHOSPHATE 1 % EX GEL: Freq: Two times a day (BID) | CUTANEOUS | 0 refills | Status: DC

## 2016-10-27 NOTE — Progress Notes (Signed)
Subjective:  Victor Jensen is a 31 y.o. male who presents for bumps/rash on both ears x last few days.  He wears a toboggan regularly, gets sweaty a lot including over/behind ears.  Recent been using OTC body building supplement.    He notes vision at times seems less crisp, blurred, but no vision loss, no eye pain, no redness.     Gets numb in arms sometimes in sleep or when seated.  No chest pain, no edema, no sob, no other unilateal weakness or tingling or numbness.    No other aggravating or relieving factors.    No other c/o.  The following portions of the patient's history were reviewed and updated as appropriate: allergies, current medications, past family history, past medical history, past social history, past surgical history and problem list.  ROS Otherwise as in subjective above  Past Medical History:  Diagnosis Date  . Allergic rhinitis   . Allergy   . Family history of premature CAD    father, died MI in early 67s  . H/O cardiovascular stress test 01/2015   LVH, EF 65-70%, but other imaging advised, Dr. Antoine Poche  . History of MRI of chest    cardiac MRI.  Concentric LVH, mildly dilated LV. Dr. Antoine Poche  . History of sleep disturbance 04/2015   equivocal sleep study 04/2015  . Obesity    No current outpatient prescriptions on file prior to visit.   No current facility-administered medications on file prior to visit.     Objective:Exam BP 130/84   Pulse 80   Temp 98.2 F (36.8 C)   Wt 266 lb 6.4 oz (120.8 kg)   SpO2 96%   BMI 37.68 kg/m   General appearance: alert, no distress, WD/WN several small pustules along pinna anterior and posteriorly HEENT: normocephalic, sclerae anicteric, conjunctiva pink and moist, TMs pearly, nares patent, no discharge or erythema, pharynx normal Oral cavity: MMM, no lesions Neck: supple, no lymphadenopathy, no thyromegaly, no masses Heart: RRR, normal S1, S2, no murmurs Lungs: CTA bilaterally, no wheezes, rhonchi, or  rales Pulses: 2+ radial pulses, 2+ pedal pulses, normal cap refill Neuro: CN2-12 intact, non focal exam Ext: no edema   Assessment: Encounter Diagnoses  Name Primary?  . Rash Yes  . Folliculitis   . Vision disturbance   . Paresthesia      Plan: Rash/folliculitis - being gel below, avoid wearing the toboggan dripping sweat, heat and moisture.  Recheck if not resolved within 2wk  vision disturbance- go see eye doctor for eval.    Vision exam today WNL  paresthesia - likely position with sleep or sitting with arms outstretched.   Reassured.  No abnormality on exam or obvious worse cause.  Follow up: 1-2 wk.   [name] was seen today for rash.  Diagnoses and all orders for this visit:  Rash  Folliculitis  Vision disturbance  Paresthesia  Other orders -     Discontinue: clindamycin (CLINDAGEL) 1 % gel; Apply topically 2 (two) times daily. -     Discontinue: clindamycin (CLINDAGEL) 1 % gel; Apply topically 2 (two) times daily. -     clindamycin-benzoyl peroxide (BENZACLIN) gel; Apply topically 2 (two) times daily.

## 2016-12-05 ENCOUNTER — Encounter: Payer: 59 | Admitting: Medical

## 2016-12-21 ENCOUNTER — Encounter: Payer: Self-pay | Admitting: Medical

## 2016-12-23 IMAGING — MR MR LUMBAR SPINE W/O CM
4 of 5 series · 26 of 48 positions shown · non-contrast
Comparison: Plain films 03/08/2015.

CLINICAL DATA: Two week history of left-sided low back pain
radiating to the entire LEFT leg with numbness.

EXAM:
MRI LUMBAR SPINE WITHOUT CONTRAST
TECHNIQUE: Multiplanar, multisequence MR imaging of the lumbar spine was
performed. No intravenous contrast was administered.

[Series 3: T2 · sagittal · 4.0mm · 0.55mm/px · 6 of 14 slices shown (1 of 2)]
[im 1/14]
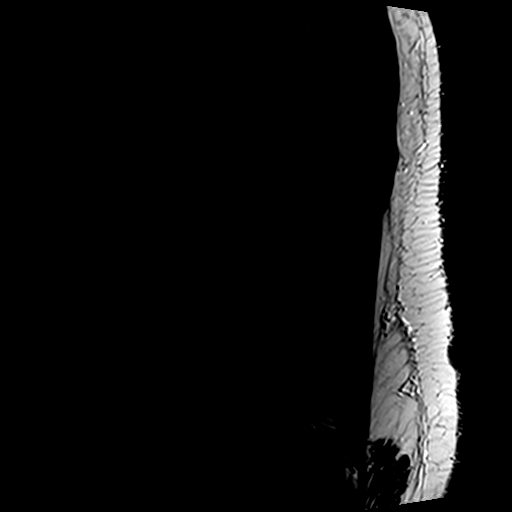
[im 3/14]
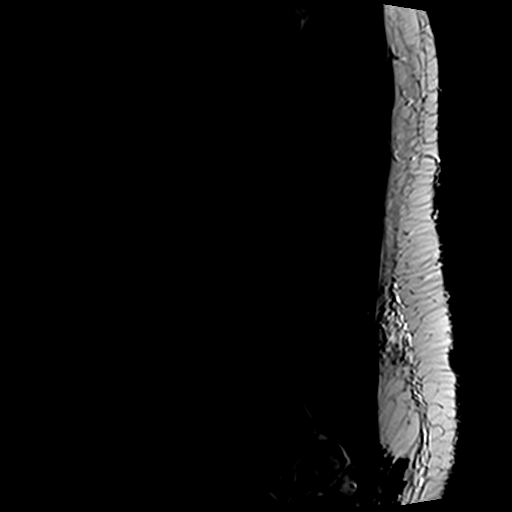
[im 6/14]
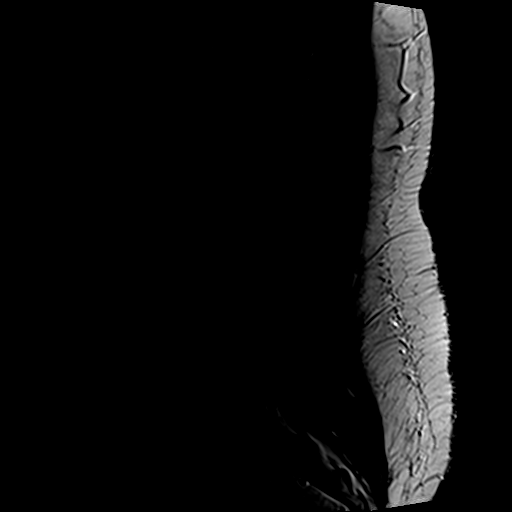
[im 8/14]
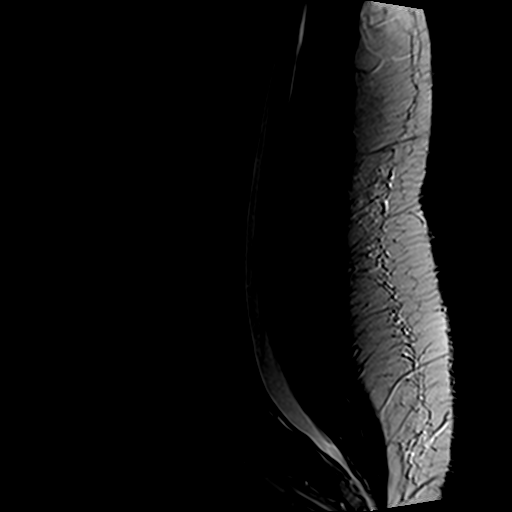
[im 11/14]
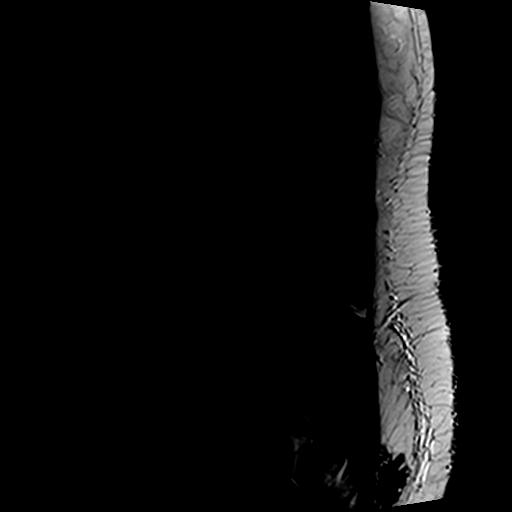
[im 14/14]
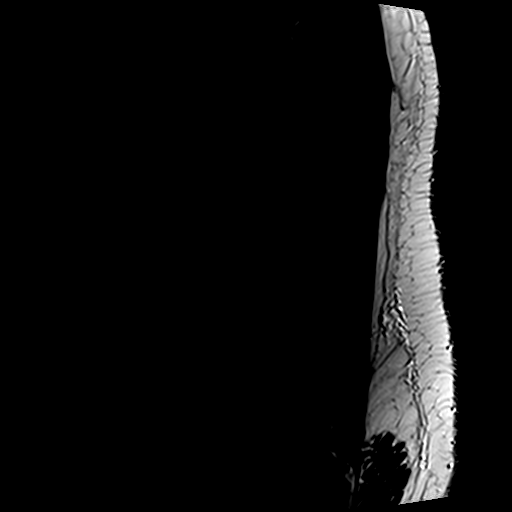

[Series 4: T1 · sagittal · 4.0mm · 0.55mm/px · 6 of 14 slices shown (1 of 2)]
[im 1/14]
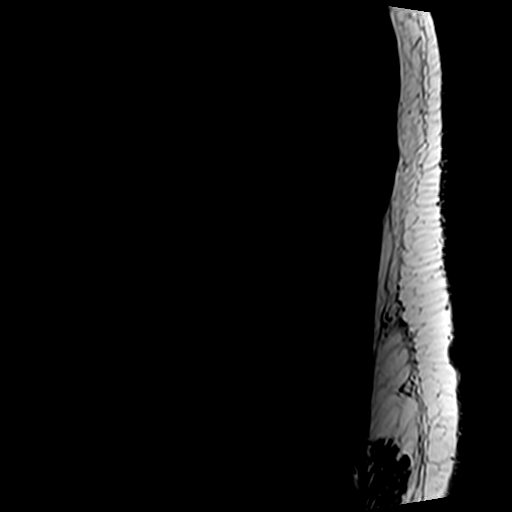
[im 3/14]
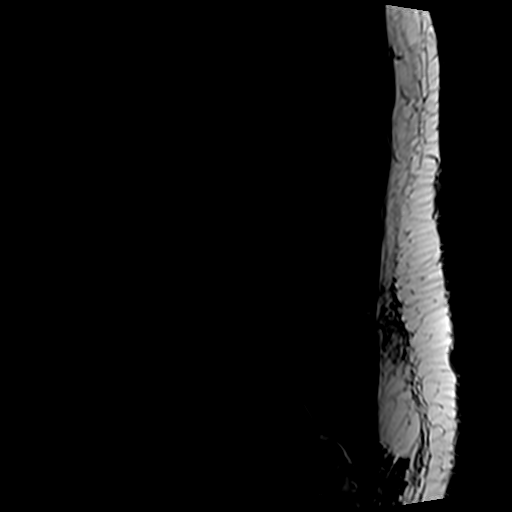
[im 6/14]
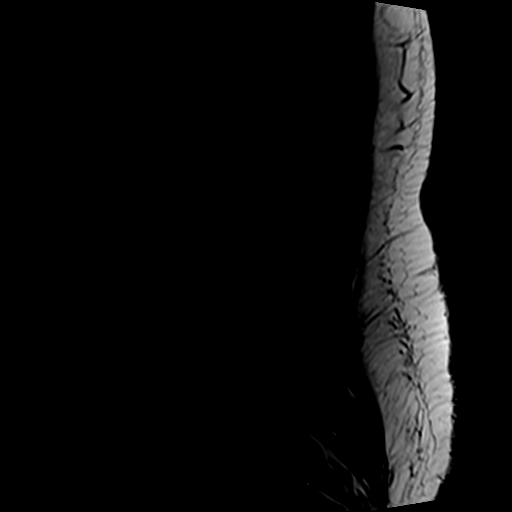
[im 8/14]
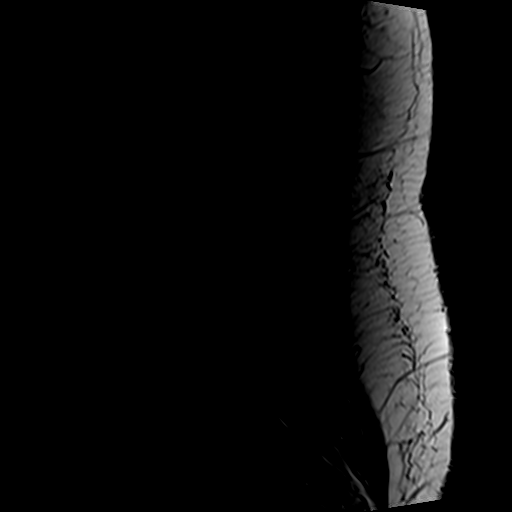
[im 11/14]
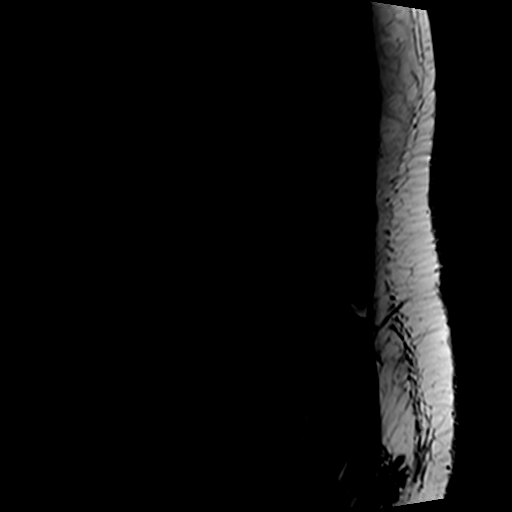
[im 14/14]
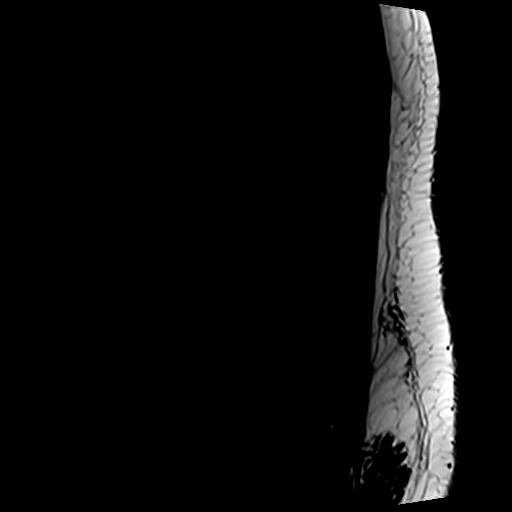

[Series 6: T2 · axial · 4.0mm · 0.70mm/px · z∈[-111,+85]mm · 9 of 35 slices shown (2 of 2)]
[im 1/35]
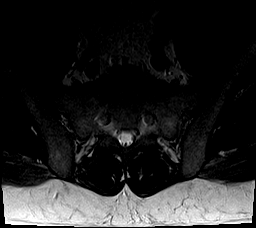
[im 5/35]
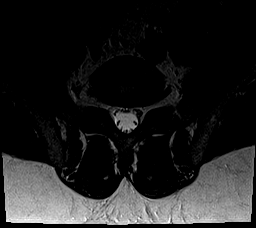
[im 10/35]
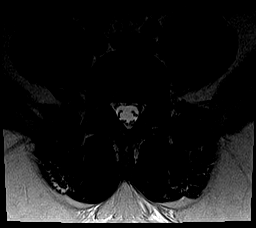
[im 15/35]
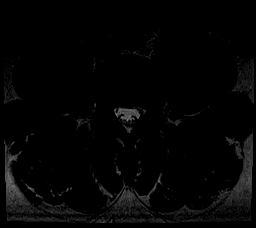
[im 18/35]
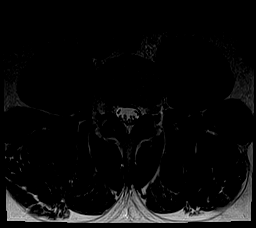
[im 20/35]
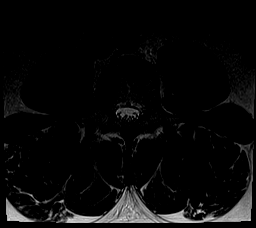
[im 25/35]
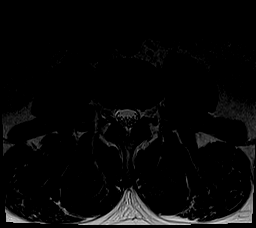
[im 30/35]
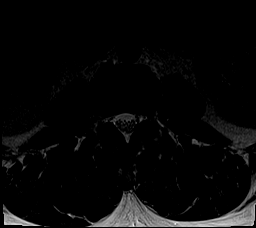
[im 35/35]
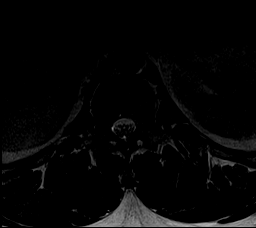

[Series 7: T1 · axial · 4.0mm · 0.35mm/px · z∈[-111,+60]mm · 5 of 35 slices shown (2 of 2)]
[im 1/35]
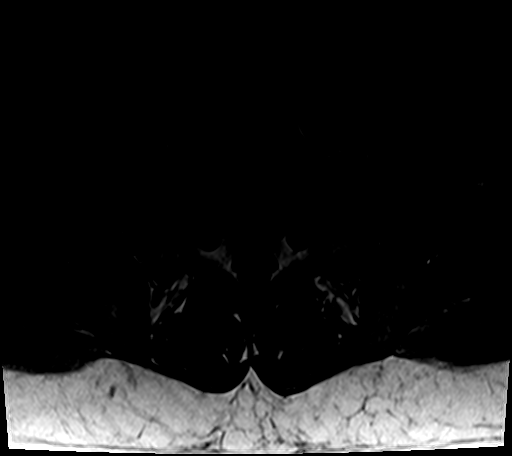
[im 5/35]
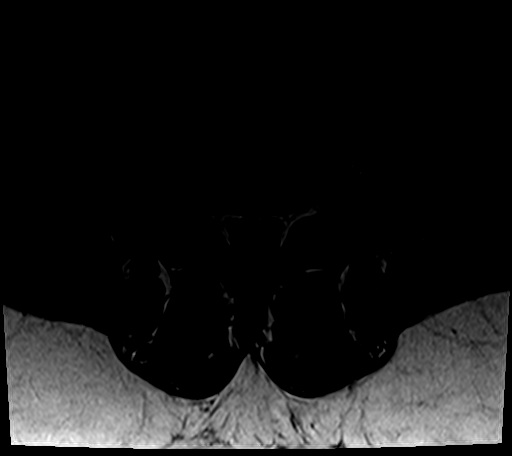
[im 10/35]
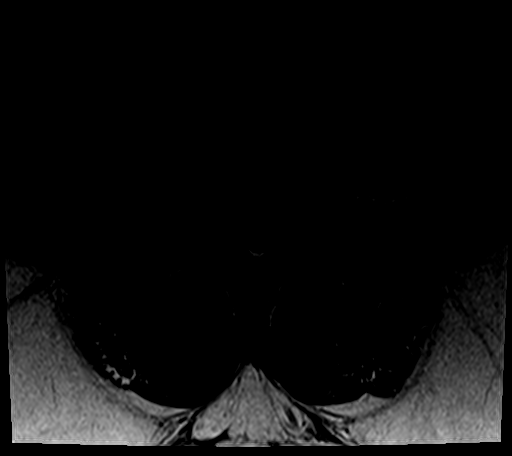
[im 18/35]
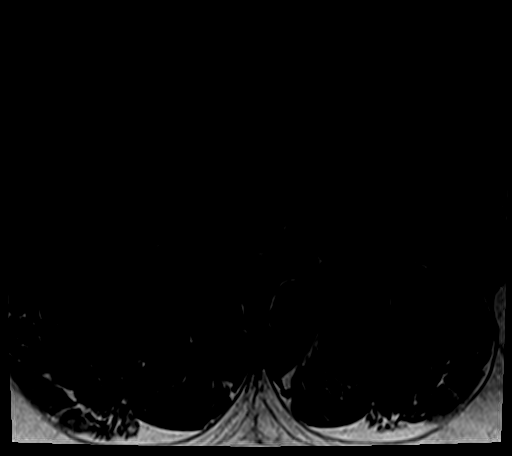
[im 30/35]
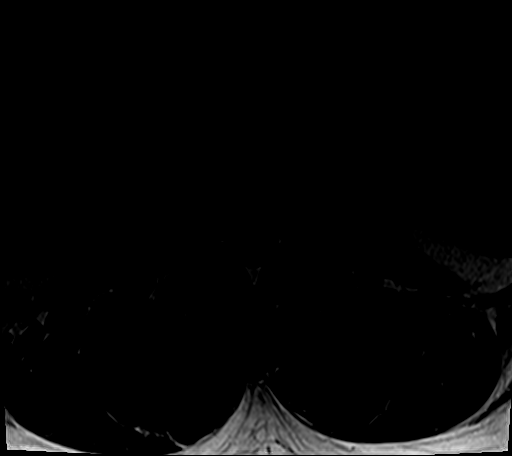

[26 of 48 positions shown; findings below may reference images not displayed]

FINDINGS: Segmentation: Normal.

Alignment:  Normal.

Vertebrae: No worrisome osseous lesion.

Conus medullaris: Normal in size, signal, and location.

Paraspinal tissues: No evidence for hydronephrosis or paravertebral
mass.

Disc levels:

L1-L2:  Normal.

L2-L3:  Normal.

L3-L4:  Normal disc space.  Mild facet arthropathy.  No impingement.

L4-L5: Mild bulge centrally. Moderate facet arthropathy. There is a
small annular tear in the foramen on the LEFT. The LEFT L4 nerve
root could be irritated. There is no significant foraminal narrowing
or extraforaminal protrusion. No subarticular zone narrowing is
present.

L5-S1: Unremarkable disc space. Mild facet arthropathy. No
impingement.
IMPRESSION: Small annular tear at L4-5 in the foramen on the LEFT. This could
potentially irritate the LEFT L4 nerve root. The study is otherwise
unremarkable.

## 2017-05-19 ENCOUNTER — Encounter (HOSPITAL_COMMUNITY): Payer: Self-pay | Admitting: Emergency Medicine

## 2017-05-19 ENCOUNTER — Emergency Department (HOSPITAL_COMMUNITY)
Admission: EM | Admit: 2017-05-19 | Discharge: 2017-05-20 | Disposition: A | Discharge status: Home / Self Care | Payer: 59 | Attending: Emergency Medicine | Admitting: Emergency Medicine

## 2017-05-19 ENCOUNTER — Other Ambulatory Visit: Payer: Self-pay

## 2017-05-19 ENCOUNTER — Emergency Department (HOSPITAL_COMMUNITY): Payer: 59

## 2017-05-19 DIAGNOSIS — R0602 Shortness of breath: Secondary | ICD-10-CM | POA: Diagnosis present

## 2017-05-19 DIAGNOSIS — I10 Essential (primary) hypertension: Secondary | ICD-10-CM | POA: Insufficient documentation

## 2017-05-19 DIAGNOSIS — Z87891 Personal history of nicotine dependence: Secondary | ICD-10-CM | POA: Diagnosis not present

## 2017-05-19 DIAGNOSIS — R110 Nausea: Secondary | ICD-10-CM | POA: Diagnosis not present

## 2017-05-19 DIAGNOSIS — R609 Edema, unspecified: Secondary | ICD-10-CM

## 2017-05-19 DIAGNOSIS — R0789 Other chest pain: Secondary | ICD-10-CM | POA: Diagnosis not present

## 2017-05-19 DIAGNOSIS — R079 Chest pain, unspecified: Secondary | ICD-10-CM | POA: Diagnosis not present

## 2017-05-19 DIAGNOSIS — Z79899 Other long term (current) drug therapy: Secondary | ICD-10-CM | POA: Diagnosis not present

## 2017-05-19 LAB — CBC
HCT: 40.4 % (ref 39.0–52.0)
HEMOGLOBIN: 13.5 g/dL (ref 13.0–17.0)
MCH: 26.5 pg (ref 26.0–34.0)
MCHC: 33.4 g/dL (ref 30.0–36.0)
MCV: 79.4 fL (ref 78.0–100.0)
Platelets: 192 10*3/uL (ref 150–400)
RBC: 5.09 MIL/uL (ref 4.22–5.81)
RDW: 13.2 % (ref 11.5–15.5)
WBC: 6.5 10*3/uL (ref 4.0–10.5)

## 2017-05-19 LAB — BASIC METABOLIC PANEL
ANION GAP: 9 (ref 5–15)
BUN: 12 mg/dL (ref 6–20)
CALCIUM: 9.1 mg/dL (ref 8.9–10.3)
CO2: 26 mmol/L (ref 22–32)
Chloride: 103 mmol/L (ref 101–111)
Creatinine, Ser: 1.12 mg/dL (ref 0.61–1.24)
GFR calc Af Amer: >60 mL/min (ref >60)
Glucose, Bld: 94 mg/dL (ref 65–99)
POTASSIUM: 3.5 mmol/L (ref 3.5–5.1)
Sodium: 138 mmol/L (ref 135–145)

## 2017-05-19 LAB — I-STAT TROPONIN, ED: TROPONIN I, POC: 0 ng/mL (ref 0.00–0.08)

## 2017-05-19 NOTE — ED Triage Notes (Signed)
Patient presents to ED for assessment after having right posterior leg pain, and bilateral leg swelling and tightness today, and then sudden onset of SOB approx 30 minutes ago.  Denies CP.  Denies back pain.  Hx of recent long travel

## 2017-05-20 DIAGNOSIS — Z79899 Other long term (current) drug therapy: Secondary | ICD-10-CM | POA: Diagnosis not present

## 2017-05-20 DIAGNOSIS — Z87891 Personal history of nicotine dependence: Secondary | ICD-10-CM | POA: Diagnosis not present

## 2017-05-20 DIAGNOSIS — I10 Essential (primary) hypertension: Secondary | ICD-10-CM | POA: Diagnosis not present

## 2017-05-20 LAB — I-STAT TROPONIN, ED: Troponin i, poc: 0.01 ng/mL (ref 0.00–0.08)

## 2017-05-20 LAB — HEPATIC FUNCTION PANEL
ALBUMIN: 4.3 g/dL (ref 3.5–5.0)
ALK PHOS: 54 U/L (ref 38–126)
ALT: 27 U/L (ref 17–63)
AST: 37 U/L (ref 15–41)
BILIRUBIN TOTAL: 1.1 mg/dL (ref 0.3–1.2)
Bilirubin, Direct: 0.1 mg/dL (ref 0.1–0.5)
Indirect Bilirubin: 1 mg/dL — ABNORMAL HIGH (ref 0.3–0.9)
Total Protein: 7.3 g/dL (ref 6.5–8.1)

## 2017-05-20 LAB — D-DIMER, QUANTITATIVE: D-Dimer, Quant: 0.33 ug/mL-FEU (ref 0.00–0.50)

## 2017-05-20 LAB — BRAIN NATRIURETIC PEPTIDE: B Natriuretic Peptide: 31.3 pg/mL (ref 0.0–100.0)

## 2017-05-20 MED ORDER — HYDROCHLOROTHIAZIDE 12.5 MG PO TABS: 25.0000 mg | ORAL_TABLET | Freq: Every day | ORAL | 0 refills | Status: DC

## 2017-05-20 MED ORDER — GI COCKTAIL ~~LOC~~
30.0000 mL | Freq: Once | ORAL | Status: AC
  Administered 2017-05-20: 30 mL via ORAL
  Filled 2017-05-20: qty 30

## 2017-05-20 MED ORDER — ONDANSETRON 4 MG PO TBDP
4.0000 mg | ORAL_TABLET | Freq: Once | ORAL | Status: AC
  Administered 2017-05-20: 4 mg via ORAL
  Filled 2017-05-20: qty 1

## 2017-05-20 MED ORDER — HYDROCHLOROTHIAZIDE 12.5 MG PO TABS: 12.5000 mg | ORAL_TABLET | Freq: Every day | ORAL | 0 refills | Status: DC

## 2017-05-20 NOTE — ED Notes (Signed)
Pt verbalized understanding of discharge paperwork, follow-up appts, and prescriptions.

## 2017-05-20 NOTE — ED Provider Notes (Signed)
[facility] EMERGENCY DEPARTMENT Provider Note   CSN: [id number] Arrival date & time: 05/19/17  2121     History   Chief Complaint Chief Complaint  Patient presents with  . Leg Swelling  . Shortness of Breath    HPI Victor Jensen is a 32 y.o. male.  HPI 32 year old male with history of hypertension, nonadherence with his hypertension meds, here with leg swelling and shortness of breath.  The patient recently traveled to the beach over the weekend.  He was returning home when he noticed that he had increasing swelling in his bilateral legs.  Associated aching, throbbing, mild bilateral leg pain.  He then experienced nausea and a sensation of mild chest pressure and a feeling like he needed to vomit.  He had some loose stools as well.  Denies known sick contacts.  He states that since then, he has had mild aching of his bilateral legs.  He is also had persistent nausea and loose stools.  No fever.  He has not actually vomited.  He said no sputum production or cough.  Denies any recent surgeries.  Denies any history of DVT in himself or his family is.  He does have a history of LVH and abnormal EKG and has been cleared recently by cardiology.  No other medical complaints.  No current chest pain.  His leg pain is bilateral, aching, with associated mild edema.  He does note that he had significant dietary indiscretions with very high salt loads as well as drinking over the weekend.  No tobacco use.    Patient Active Problem List   Diagnosis Date Noted  . Annual physical exam 07/23/2015  . 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 Impaired fasting blood sugar 07/23/2015  . Obesity 07/23/2015  . Sleep disturbance 07/23/2015  . Nonspecific abnormal electrocardiogram (ECG) (EKG) 07/23/2015  . LVH (left ventricular hypertrophy) 07/23/2015  . Family history of premature CAD 07/23/2015  . Family history of diabetes mellitus 07/23/2015  . Elevated serum creatinine  07/23/2015  . Abnormal EKG 03/12/2015  . Precordial chest pain 12/02/2014    Past Surgical History:  Procedure Laterality Date  . NO PAST SURGERIES  06/2015  . None          Home Medications    Prior to Admission medications   Medication Sig Start Date End Date Taking? Authorizing Provider  clindamycin-benzoyl peroxide (BENZACLIN) gel Apply topically 2 (two) times daily. 10/27/16   Tysinger, Kermit Balo, PA-C  hydrochlorothiazide (HYDRODIURIL) 12.5 MG tablet Take 1 tablet (12.5 mg total) by mouth daily for 14 days. 05/20/17 06/03/17  Shaune Pollack, MD    Family History Family History  Problem Relation Age of Onset  . Heart attack Father 19  . Diabetes Father        Severe  . Heart disease Father 59  . Diabetes Paternal Grandfather   . Diabetes Maternal Grandfather   . Heart disease Maternal Grandfather   . Cancer Other   . Diabetes Maternal Grandmother     Social History Social History   Tobacco Use  . Smoking status: Former Smoker    Packs/day: 0.50    Years: 3.00    Pack years: 1.50  . Smokeless tobacco: Never Used  Substance Use Topics  . Alcohol use: No    Alcohol/week: 0.0 oz  . Drug use: Yes    Types: Marijuana    Comment: rare     Allergies   Patient has no known allergies.  Review of Systems Review of Systems  Constitutional: Positive for fatigue.  Respiratory: Positive for shortness of breath.   Cardiovascular: Positive for leg swelling.  Gastrointestinal: Positive for nausea.  Neurological: Positive for weakness.  All other systems reviewed and are negative.    Physical Exam Updated Vital Signs BP (!) 139/101 (BP Location: Right Arm)   Pulse (!) 55   Temp 98.4 F (36.9 C) (Oral)   Resp 18   SpO2 98%   Physical Exam  Constitutional: He is oriented to person, place, and time. He appears well-developed and well-nourished. No distress.  HENT:  Head: Normocephalic and atraumatic.  Eyes: Conjunctivae are normal.  Neck: Neck supple.    Cardiovascular: Normal rate, regular rhythm and normal heart sounds. Exam reveals no friction rub.  No murmur heard. Pulmonary/Chest: Effort normal and breath sounds normal. No respiratory distress. He has no wheezes. He has no rales.  Abdominal: Soft. He exhibits no distension. There is no tenderness. There is no guarding.  Musculoskeletal:       Right lower leg: He exhibits edema (1+, pitting).       Left lower leg: He exhibits edema (1+, pitting).  Neurological: He is alert and oriented to person, place, and time. He exhibits normal muscle tone.  Skin: Skin is warm. Capillary refill takes less than 2 seconds.  Psychiatric: He has a normal mood and affect.  Nursing note and vitals reviewed.    ED Treatments / Results  Labs (all labs ordered are listed, but only abnormal results are displayed) Labs Reviewed  HEPATIC FUNCTION PANEL - Abnormal; Notable for the following components:      Result Value   Indirect Bilirubin 1.0 (*)    All other components within normal limits  BASIC METABOLIC PANEL  CBC  BRAIN NATRIURETIC PEPTIDE  D-DIMER, QUANTITATIVE (NOT AT [facility])  I-STAT TROPONIN, ED  I-STAT TROPONIN, ED    EKG EKG Interpretation  Date/Time:  Saturday May 19 2017 21:42:09 EDT Ventricular Rate:  57 PR Interval:  212 QRS Duration: 86 QT Interval:  400 QTC Calculation: 389 R Axis:   88 Text Interpretation:  Sinus bradycardia with 1st degree A-V block T wave abnormality, consider inferolateral ischemia Abnormal ECG When compared with ECG of 11/14/2014, No significant change was found Confirmed by Dione Booze ([id number]) on 05/20/2017 12:21:36 AM   Radiology Dg Chest 2 View  Result Date: 05/19/2017 CLINICAL DATA:  SOB, chest pain, recent long travel, B/L leg edema X todayHx: Irregular EKG, HTN, ex-smoker EXAM: CHEST - 2 VIEW COMPARISON:  11/14/2014 FINDINGS: The heart size and mediastinal contours are within normal limits. Both lungs are clear. No pleural effusion or  pneumothorax. The visualized skeletal structures are unremarkable. IMPRESSION: Normal chest radiographs. Electronically Signed   By: Amie Portland M.D.   On: 05/19/2017 21:53    Procedures Procedures (including critical care time)  Medications Ordered in ED Medications  ondansetron (ZOFRAN-ODT) disintegrating tablet 4 mg (4 mg Oral Given 05/20/17 0744)  gi cocktail (Maalox,Lidocaine,Donnatal) (30 mLs Oral Given 05/20/17 0744)     Initial Impression / Assessment and Plan / ED Course  I have reviewed the triage vital signs and the nursing notes.  Pertinent labs & imaging results that were available during my care of the patient were reviewed by me and considered in my medical decision making (see chart for details).  Clinical Course as of May 20 920  Sun May 20, 2017  2682 32 year old male here with bilateral mild peripheral edema and hypertension with  mild nausea in the setting of recent dietary indiscretion.  I suspect his symptoms are due to his underlying hypertension, LVH, and possibly increased salt intake.  Regarding his leg swelling, it is bilateral and is consistent with dependent edema.  He has no popliteal tenderness, no risk factors for DVT, and his d-dimer negative.  Doubt DVT.  Guarding his transient chest pain, he is a low risk heart score and his troponin negative x2 with reassuring EKG and no active pain.  Do not suspect ACS, PE, or dissection.  He does appear hypertensive here and I suspect this is his baseline per review of records as well as LVH on his EKG.  Discussed treatment options with him.  Given that he Artie has leg swelling, will hold on calcium blocker and will elect for very low-dose hydrochlorothiazide.  I instructed him to follow-up with his PCP in 1 to 2 weeks for repeat lab work.  Encouraged healthy diet.  Good return precautions given.   [CI]    Clinical Course User Index [CI] Shaune Pollack, MD     Final Clinical Impressions(s) / ED Diagnoses   Final  diagnoses:  Peripheral edema  Nausea  Essential hypertension    ED Discharge Orders        Ordered    hydrochlorothiazide (HYDRODIURIL) 12.5 MG tablet  Daily,   Status:  Discontinued     05/20/17 0915    hydrochlorothiazide (HYDRODIURIL) 12.5 MG tablet  Daily     05/20/17 0915       Shaune Pollack, MD 05/20/17 [phone]

## 2017-05-20 NOTE — ED Notes (Addendum)
Charge RN called to lobby by EMT first, pt upset about wait times. Upset that techs have not verbally told him what his blood pressure is. Encouraged pt to ask for this information. Explained delays, admit holds. Apologized and thanked for waiting.

## 2017-05-22 MED FILL — HYDROCHLOROTHIAZIDE 12.5 MG: 12.5 | 14 days supply | Qty: 14 | Fill #0

## 2017-05-29 ENCOUNTER — Encounter: Payer: Self-pay | Admitting: Medical

## 2017-05-29 ENCOUNTER — Ambulatory Visit: Payer: 59 | Admitting: Medical

## 2017-05-29 VITALS — BP 130/90 | HR 63 | Temp 97.9°F | Ht 70.5 in | Wt 269.0 lb

## 2017-05-29 DIAGNOSIS — Z8249 Family history of ischemic heart disease and other diseases of the circulatory system: Secondary | ICD-10-CM | POA: Diagnosis not present

## 2017-05-29 DIAGNOSIS — I517 Cardiomegaly: Secondary | ICD-10-CM

## 2017-05-29 DIAGNOSIS — M7989 Other specified soft tissue disorders: Secondary | ICD-10-CM | POA: Diagnosis not present

## 2017-05-29 DIAGNOSIS — E790 Hyperuricemia without signs of inflammatory arthritis and tophaceous disease: Secondary | ICD-10-CM

## 2017-05-29 DIAGNOSIS — I10 Essential (primary) hypertension: Secondary | ICD-10-CM | POA: Diagnosis not present

## 2017-05-29 MED ORDER — LOSARTAN POTASSIUM-HCTZ 50-12.5 MG PO TABS: 1.0000 | ORAL_TABLET | Freq: Every day | ORAL | 0 refills | Status: DC

## 2017-05-29 MED FILL — LOSARTAN-HCTZ 50-12.5 MG TA: 50-12.5 | 90 days supply | Qty: 90 | Fill #0

## 2017-05-29 NOTE — Patient Instructions (Signed)
Recommendations:  Finish the hydrochlorothiazide pill, then change to Losartan HCT once daily in the morning  Limit salt  Continue your exercise and healthy diet  Try to lose some weight as this helps lower the blood pressure  Lets plan to recheck either on blood pressure or a physical in the next months as you will need some labs at that time  We will refer back to cardiology to plan visit in the next 2 months or so as you are due back there, likely for repeat ultrasound

## 2017-05-29 NOTE — Progress Notes (Signed)
Subjective: Chief Complaint  Patient presents with  . Hospitalization Follow-up    blood pressure issues   Here for emergency dept visit follow up.   Was seen 05/20/17 for leg swelling and SOB.  He had recent travel to the beach.  He reported bilat leg swelling, had some chest pressure.  After eval, chart notes show they felt his symptoms were due to increased salt intake, underlying HTN and LVH, and they diagnosed with dependent edema.  He was started on HCTZ.  Today he reports improvement on symptoms, improvement on leg swelling, taking HCTZ without c/o.    Prior to his vacation at the beach which led to swelling, he was exercising at least 3 days per week, was eating a lot of salads, very little meat.   He doesn't not smoke or drink alcohol.  Was meal prepping regularly, was on a good track.    During his vacation though right before the ED visit he had a week of eating all kinds of salt and fried foods at the beach.   No other aggravating or relieving factors. No other complaint.  Past Medical History:  Diagnosis Date  . Allergic rhinitis   . Allergy   . Family history of premature CAD    father, died MI in early 16s  . H/O cardiovascular stress test 01/2015   LVH, EF 65-70%, but other imaging advised, Dr. Antoine Poche  . History of MRI of chest    cardiac MRI.  Concentric LVH, mildly dilated LV. Dr. Antoine Poche  . History of sleep disturbance 04/2015   equivocal sleep study 04/2015  . Obesity    No current outpatient medications on file prior to visit.   No current facility-administered medications on file prior to visit.    Family History  Problem Relation Age of Onset  . Heart attack Father 47  . Diabetes Father        Severe  . Heart disease Father 56  . Diabetes Paternal Grandfather   . Diabetes Maternal Grandfather   . Heart disease Maternal Grandfather   . Cancer Other   . Diabetes Maternal Grandmother     ROS as in subjective    Objective: BP 130/90   Pulse 63    Temp 97.9 F (36.6 C) (Oral)   Ht 5' 10.5" (1.791 m)   Wt 269 lb (122 kg)   SpO2 98%   BMI 38.05 kg/m   BP Readings from Last 3 Encounters:  05/29/17 130/90  05/20/17 (!) 127/96  10/27/16 130/84   Wt Readings from Last 3 Encounters:  05/29/17 269 lb (122 kg)  10/27/16 266 lb 6.4 oz (120.8 kg)  09/27/16 266 lb 12.8 oz (121 kg)   General appearance: alert, no distress, WD/WN,  Neck: supple, no lymphadenopathy, no thyromegaly, no masses, no JVD or bruits Heart: RRR, normal S1, S2, no murmurs Lungs: CTA bilaterally, no wheezes, rhonchi, or rales Ext: no edema Pulses: 2+ symmetric, upper and lower extremities, normal cap refill     Assessment: Encounter Diagnoses  Name Primary?  . Leg swelling   . LVH (left ventricular hypertrophy) Yes  . Elevated uric acid in blood   . Family history of premature CAD   . Essential hypertension, benign       Plan: I reviewed the 05/20/2017 ED visit notes, labs and recommendations.  reviewed medications .   I reviewed MRI cardiac from 03/2015 showing mildly dilated left ventricle and LVH, normal systolic function EF 56%, moderate left and right atrial  dilatation, c/w hypertensive heart disease.   04/2015 visit with cardiology advised management of his hypertensive heart disease with lifestyle modification, and advised 1 year f/u.   Looking back, I had prescribed him Losartan back in 06/2015 given ongoing elevated BPs as well as allopurinol for uric acid elevation. He only took this a brief period.  Prior to his recent vacation it sounds like he was doing well with diet and exercise.   Advised he get back on his routine, but change to medication below.  We will plan to see him back in 4-6 wk for physical, fasting labs and recheck.   Discussed risks of uncontrolled BP.   Victor Jensen was seen today for hospitalization follow-up.  Diagnoses and all orders for this visit:  LVH (left ventricular hypertrophy)  Leg swelling  Elevated uric acid  in blood  Family history of premature CAD  Essential hypertension, benign  Other orders -     losartan-hydrochlorothiazide (HYZAAR) 50-12.5 MG tablet; Take 1 tablet by mouth daily.  f/u 4-6 wk for physical

## 2017-06-12 ENCOUNTER — Ambulatory Visit: Payer: 59 | Admitting: Medical

## 2017-06-12 ENCOUNTER — Encounter: Payer: Self-pay | Admitting: Medical

## 2017-06-12 VITALS — BP 130/84 | HR 62 | Temp 98.3°F | Ht 70.5 in | Wt 272.0 lb

## 2017-06-12 DIAGNOSIS — R079 Chest pain, unspecified: Secondary | ICD-10-CM

## 2017-06-12 DIAGNOSIS — T50905A Adverse effect of unspecified drugs, medicaments and biological substances, initial encounter: Secondary | ICD-10-CM

## 2017-06-12 DIAGNOSIS — I10 Essential (primary) hypertension: Secondary | ICD-10-CM | POA: Diagnosis not present

## 2017-06-12 DIAGNOSIS — R110 Nausea: Secondary | ICD-10-CM | POA: Diagnosis not present

## 2017-06-12 MED ORDER — HYDROCHLOROTHIAZIDE 12.5 MG PO TABS: 12.5000 mg | ORAL_TABLET | Freq: Every day | ORAL | 2 refills | Status: DC

## 2017-06-12 MED FILL — HYDROCHLOROTHIAZIDE 12.5 MG: 12.5 | 30 days supply | Qty: 30 | Fill #0

## 2017-06-12 NOTE — Patient Instructions (Signed)
Hypertension (high blood pressure):  Your blood pressure in under good  control.  Medication:  STOP Losartan HCT, and change back to plain Hydrochlorothiazide fluid pill 12.5mg  daily in the morning  Specific recommendations:  Exercise regularly, preferably every day with aerobic exercise for 30 minutes or more  Avoid added salt in the diet.   Follow a DASH eating plan or Mediterranean eating plan  Check blood pressures 3 times weekly and record.  Goal is 120/70   Too low pressure would be 105/60 for examples  We will need you to return for a blood test in 2 weeks on the Hydrochlorothiazide   Hypertension, commonly called high blood pressure, is when the force of blood pumping through your arteries is too strong. Your arteries are the blood vessels that carry blood from your heart throughout your body. A blood pressure reading consists of a higher number over a lower number, such as 110/72. The higher number (systolic) is the pressure inside your arteries when your heart pumps. The lower number (diastolic) is the pressure inside your arteries when your heart relaxes. Ideally you want your blood pressure below 120/80. Hypertension forces your heart to work harder to pump blood. Your arteries may become narrow or stiff. Having hypertension puts you at risk for heart disease, stroke, and other problems.  RISK FACTORS Some risk factors for high blood pressure are controllable. Others are not.  Risk factors you cannot control include:   Race. You may be at higher risk if you are African American.  Age. Risk increases with age.  Gender. Men are at higher risk than women before age 33 years. After age 7, women are at higher risk than men. Risk factors you can control include:  Not getting enough exercise or physical activity.  Being overweight.  Getting too much fat, sugar, calories, or salt in your diet.  Drinking too much alcohol. SIGNS AND SYMPTOMS Hypertension does not  usually cause signs or symptoms. Extremely high blood pressure (hypertensive crisis) may cause headache, anxiety, shortness of breath, and nosebleed. DIAGNOSIS  To check if you have hypertension, your health care provider will measure your blood pressure while you are seated, with your arm held at the level of your heart. It should be measured at least twice using the same arm. Certain conditions can cause a difference in blood pressure between your right and left arms. A blood pressure reading that is higher than normal on one occasion does not mean that you need treatment. If one blood pressure reading is high, ask your health care provider about having it checked again. BLOOD PRESSURE STAGES Blood pressure is classified into four stages: normal, prehypertension, stage 1, and stage 2. Your blood pressure reading will be used to determine what type of treatment, if any, is necessary. Appropriate treatment options are tied to these four stages:  Normal  Systolic pressure (mm Hg): below 120.  Diastolic pressure (mm Hg): below 80. Prehypertension  Systolic pressure (mm Hg): 120 to 139.  Diastolic pressure (mm Hg): 80 to 89. Stage1  Systolic pressure (mm Hg): 140 to 159.  Diastolic pressure (mm Hg): 90 to 99. Stage2  Systolic pressure (mm Hg): 160 or above.  Diastolic pressure (mm Hg): 100 or above. RISKS RELATED TO HIGH BLOOD PRESSURE Managing your blood pressure is an important responsibility. Uncontrolled high blood pressure can lead to:  A heart attack.  A stroke.  A weakened blood vessel (aneurysm).  Heart failure.  Kidney damage.  Eye damage.  Metabolic syndrome.  Memory and concentration problems. TREATMENT  Treating high blood pressure includes making lifestyle changes and possibly taking medicine. Living a healthy lifestyle can help lower high blood pressure. You may need to change some of your habits. Lifestyle changes may include:  Following the DASH diet.  This diet is high in fruits, vegetables, and whole grains. It is low in salt, red meat, and added sugars.  Getting at least 2 hours of brisk physical activity every week.  Losing weight if necessary.  Not smoking.  Limiting alcoholic beverages.  Learning ways to reduce stress. If lifestyle changes are not enough to get your blood pressure under control, your health care provider may prescribe medicine. You may need to take more than one. Work closely with your health care provider to understand the risks and benefits. HOME CARE INSTRUCTIONS  Have your blood pressure rechecked as directed by your health care provider.   Take medicines only as directed by your health care provider. Follow the directions carefully. Blood pressure medicines must be taken as prescribed. The medicine does not work as well when you skip doses. Skipping doses also puts you at risk for problems.   Do not smoke.   Monitor your blood pressure at home as directed by your health care provider. SEEK MEDICAL CARE IF:   You think you are having a reaction to medicines taken.  You have recurrent headaches or feel dizzy.  You have swelling in your ankles.  You have trouble with your vision. SEEK IMMEDIATE MEDICAL CARE IF:  You develop a severe headache or confusion.  You have unusual weakness, numbness, or feel faint.  You have severe chest or abdominal pain.  You vomit repeatedly.  You have trouble breathing. MAKE SURE YOU:   Understand these instructions.  Will watch your condition.  Will get help right away if you are not doing well or get worse. Document Released: 01/09/2005 Document Revised: 05/26/2013 Document Reviewed: 11/01/2012 [facility] Patient Information 2015 [place], [place]. This information is not intended to replace advice given to you by your health care provider. Make sure you discuss any questions you have with your health care provider.

## 2017-06-12 NOTE — Progress Notes (Signed)
Subjective: Chief Complaint  Patient presents with  . Medication Problem    chest tightness,wheezing,dizziness,nausea    Here for c/o side effect of medication.  I saw him recently for ED f/u. After last visit he started new medication a week ago, and started getting chest tightness, wheezing, SOB, dizziness, nausea.  Last visit 05/29/17 I saw him in ED f/u after he went to the emergency dept for nausea, swelling after a vacation with lots of unhealthy food.  He had elevated pressures with the ED visit.   Was started back on HCTZ. This episode happened twice after taking medication.  He denies any recent asthma issue, no recent anxiety issue , no change in diet, been trying to eat healthy, been getting back into the gym.     He quit the new medication Losartan HCT after 3 days.  The symptoms have not recurred. No other aggravating or relieving factors. No other complaint.   Past Medical History:  Diagnosis Date  . Allergic rhinitis   . Allergy   . Family history of premature CAD    father, died MI in early 29s  . H/O cardiovascular stress test 01/2015   LVH, EF 65-70%, but other imaging advised, Dr. Antoine Poche  . History of MRI of chest    cardiac MRI.  Concentric LVH, mildly dilated LV. Dr. Antoine Poche  . History of sleep disturbance 04/2015   equivocal sleep study 04/2015  . Obesity    Current Outpatient Medications on File Prior to Visit  Medication Sig Dispense Refill  . losartan-hydrochlorothiazide (HYZAAR) 50-12.5 MG tablet Take 1 tablet by mouth daily. (Patient not taking: Reported on 06/12/2017) 90 tablet 0   No current facility-administered medications on file prior to visit.    ROS as in subjective  Objective: BP 130/84   Pulse 62   Temp 98.3 F (36.8 C) (Oral)   Ht 5' 10.5" (1.791 m)   Wt 272 lb (123.4 kg)   SpO2 98%   BMI 38.48 kg/m    Wt Readings from Last 3 Encounters:  06/12/17 272 lb (123.4 kg)  05/29/17 269 lb (122 kg)  10/27/16 266 lb 6.4 oz (120.8 kg)   BP  Readings from Last 3 Encounters:  06/12/17 130/84  05/29/17 130/90  05/20/17 (!) 127/96    General appearance: alert, no distress, WD/WN,  Neck: supple, no lymphadenopathy, no thyromegaly, no masses Heart: RRR, normal S1, S2, no murmurs Lungs: CTA bilaterally, no wheezes, rhonchi, or rales Extremities: no edema, no cyanosis, no clubbing Pulses: 2+ symmetric, upper and lower extremities, normal cap refill Neurological: alert, oriented x 3, CN2-12 intact, strength normal upper extremities and lower extremities, sensation normal throughout, DTRs 2+ throughout, no cerebellar signs, gait normal Psychiatric: normal affect, behavior normal, pleasant      Assessment: Encounter Diagnoses  Name Primary?  . Essential hypertension, benign Yes  . Adverse effect of drug, initial encounter   . Chest pain, unspecified type   . Nausea      Plan: We discussed his symptoms and concerns.  His symptoms are not necessarily all typical of side effect of this medication but he feels convinced they are related to the new medicine.  He has stopped the losartan HCT and the symptoms have resolved.  He has tolerated plain HCTZ prior so we will go back to this.  Counseled on healthy lifestyle exercise diet recommendations.  Plan to follow-up with blood pressure readings within the next month, but call or return sooner if symptoms recur.  Christiane Ha  was seen today for medication problem.  Diagnoses and all orders for this visit:  Essential hypertension, benign -     Basic metabolic panel; Future  Adverse effect of drug, initial encounter  Chest pain, unspecified type  Nausea  Other orders -     hydrochlorothiazide (HYDRODIURIL) 12.5 MG tablet; Take 1 tablet (12.5 mg total) by mouth daily.

## 2017-07-10 ENCOUNTER — Ambulatory Visit (INDEPENDENT_AMBULATORY_CARE_PROVIDER_SITE_OTHER): Payer: 59 | Admitting: Medical

## 2017-07-10 ENCOUNTER — Encounter: Payer: Self-pay | Admitting: Medical

## 2017-07-10 VITALS — BP 130/86 | HR 60 | Temp 97.6°F | Ht 70.5 in | Wt 272.8 lb

## 2017-07-10 DIAGNOSIS — M722 Plantar fascial fibromatosis: Secondary | ICD-10-CM | POA: Insufficient documentation

## 2017-07-10 DIAGNOSIS — E790 Hyperuricemia without signs of inflammatory arthritis and tophaceous disease: Secondary | ICD-10-CM

## 2017-07-10 DIAGNOSIS — E669 Obesity, unspecified: Secondary | ICD-10-CM

## 2017-07-10 DIAGNOSIS — Z0000 Encounter for general adult medical examination without abnormal findings: Secondary | ICD-10-CM | POA: Diagnosis not present

## 2017-07-10 DIAGNOSIS — Z833 Family history of diabetes mellitus: Secondary | ICD-10-CM | POA: Diagnosis not present

## 2017-07-10 DIAGNOSIS — R7301 Impaired fasting glucose: Secondary | ICD-10-CM

## 2017-07-10 DIAGNOSIS — Z7185 Encounter for immunization safety counseling: Secondary | ICD-10-CM | POA: Insufficient documentation

## 2017-07-10 DIAGNOSIS — I10 Essential (primary) hypertension: Secondary | ICD-10-CM | POA: Diagnosis not present

## 2017-07-10 DIAGNOSIS — I517 Cardiomegaly: Secondary | ICD-10-CM | POA: Diagnosis not present

## 2017-07-10 DIAGNOSIS — L709 Acne, unspecified: Secondary | ICD-10-CM

## 2017-07-10 DIAGNOSIS — Z7189 Other specified counseling: Secondary | ICD-10-CM | POA: Diagnosis not present

## 2017-07-10 DIAGNOSIS — Z8249 Family history of ischemic heart disease and other diseases of the circulatory system: Secondary | ICD-10-CM | POA: Diagnosis not present

## 2017-07-10 LAB — POCT URINALYSIS DIP (PROADVANTAGE DEVICE)
Bilirubin, UA: NEGATIVE
Glucose, UA: NEGATIVE mg/dL
Ketones, POC UA: NEGATIVE mg/dL
Leukocytes, UA: NEGATIVE
NITRITE UA: NEGATIVE
PH UA: 6 (ref 5.0–8.0)
PROTEIN UA: NEGATIVE mg/dL
RBC UA: NEGATIVE

## 2017-07-10 MED ORDER — CLINDAMYCIN PHOSPHATE 1 % EX GEL: Freq: Two times a day (BID) | CUTANEOUS | 2 refills | Status: DC

## 2017-07-10 MED ORDER — HYDROCHLOROTHIAZIDE 12.5 MG PO TABS: 12.5000 mg | ORAL_TABLET | Freq: Every day | ORAL | 3 refills | Status: DC

## 2017-07-10 MED FILL — CLINDAMYCIN PH 1% GEL: 1 | 20 days supply | Qty: 30 | Fill #0

## 2017-07-10 MED FILL — HYDROCHLOROTHIAZIDE 12.5 MG: 12.5 | 90 days supply | Qty: 90 | Fill #0

## 2017-07-10 NOTE — Patient Instructions (Addendum)
Thanks for trusting [place] with your health care and for coming in for a physical today.  Below are some general recommendations I have for you:  Yearly screenings See your eye doctor yearly for routine vision care. See your dentist yearly for routine dental care including hygiene visits twice yearly. See me here yearly for a routine physical and preventative care visit   Specific Concerns today:   Check insurance coverage for pneumococcal 23 vaccine which I recommend you have.   Recommendations:  Plantar fascitis  Every morning try doing a tennis ball massage with feet on the floor and towel stretch behind the ball of the foot to stretch the plantar fascia  Order plantar fascitis night time 90 degree splints online, through [employer] for example.   They are typically about $25 each  Avoid going barefoot since your feet flatten out without good arch support  I recommend you use arch support shoe INSERTS.   This will help support the plantar fascia and arches  You can use over the counter pain reliever for worse pain    Plantar Fasciitis Plantar fasciitis is a painful foot condition that affects the heel. It occurs when the band of tissue that connects the toes to the heel bone (plantar fascia) becomes irritated. This can happen after exercising too much or doing other repetitive activities (overuse injury). The pain from plantar fasciitis can range from mild irritation to severe pain that makes it difficult for you to walk or move. The pain is usually worse in the morning or after you have been sitting or lying down for a while. What are the causes? This condition may be caused by:  Standing for long periods of time.  Wearing shoes that do not fit.  Doing high-impact activities, including running, aerobics, and ballet.  Being overweight.  Having an abnormal way of walking (gait).  Having tight calf muscles.  Having high arches in your feet.  Starting a new athletic  activity.  What are the signs or symptoms? The main symptom of this condition is heel pain. Other symptoms include:  Pain that gets worse after activity or exercise.  Pain that is worse in the morning or after resting.  Pain that goes away after you walk for a few minutes.  How is this diagnosed? This condition may be diagnosed based on your signs and symptoms. Your health care provider will also do a physical exam to check for:  A tender area on the bottom of your foot.  A high arch in your foot.  Pain when you move your foot.  Difficulty moving your foot.  You may also need to have imaging studies to confirm the diagnosis. These can include:  X-rays.  Ultrasound.  MRI.  How is this treated? Treatment for plantar fasciitis depends on the severity of the condition. Your treatment may include:  Rest, ice, and over-the-counter pain medicines to manage your pain.  Exercises to stretch your calves and your plantar fascia.  A splint that holds your foot in a stretched, upward position while you sleep (night splint).  Physical therapy to relieve symptoms and prevent problems in the future.  Cortisone injections to relieve severe pain.  Extracorporeal shock wave therapy (ESWT) to stimulate damaged plantar fascia with electrical impulses. It is often used as a last resort before surgery.  Surgery, if other treatments have not worked after 12 months.  Follow these instructions at home:  Take medicines only as directed by your health care provider.  Avoid  activities that cause pain.  Roll the bottom of your foot over a bag of ice or a bottle of cold water. Do this for 20 minutes, 3-4 times a day.  Perform simple stretches as directed by your health care provider.  Try wearing athletic shoes with air-sole or gel-sole cushions or soft shoe inserts.  Wear a night splint while sleeping, if directed by your health care provider.  Keep all follow-up appointments with your  health care provider. How is this prevented?  Do not perform exercises or activities that cause heel pain.  Consider finding low-impact activities if you continue to have problems.  Lose weight if you need to. The best way to prevent plantar fasciitis is to avoid the activities that aggravate your plantar fascia. Contact a health care provider if:  Your symptoms do not go away after treatment with home care measures.  Your pain gets worse.  Your pain affects your ability to move or do your daily activities. This information is not intended to replace advice given to you by your health care provider. Make sure you discuss any questions you have with your health care provider. Document Released: 10/04/2000 Document Revised: 06/14/2015 Document Reviewed: 11/19/2013 Elsevier Interactive Patient Education  2018 [employer].      Please follow up yearly for a physical.   Preventative Care for Adults - Male      MAINTAIN REGULAR HEALTH EXAMS:  A routine yearly physical is a good way to check in with your primary care provider about your health and preventive screening. It is also an opportunity to share updates about your health and any concerns you have, and receive a thorough all-over exam.   Most health insurance companies pay for at least some preventative services.  Check with your health plan for specific coverages.  WHAT PREVENTATIVE SERVICES DO WOMEN NEED?  Adult men should have their weight and blood pressure checked regularly.   Men age 70 and older should have their cholesterol levels checked regularly.  Beginning at age 74 and continuing to age 43, men should be screened for colorectal cancer.  Certain people may need continued testing until age 60.  Updating vaccinations is part of preventative care.  Vaccinations help protect against diseases such as the flu.  Osteoporosis is a disease in which the bones lose minerals and strength as we age. Men ages 64 and over  should discuss this with their caregivers  Lab tests are generally done as part of preventative care to screen for anemia and blood disorders, to screen for problems with the kidneys and liver, to screen for bladder problems, to check blood sugar, and to check your cholesterol level.  Preventative services generally include counseling about diet, exercise, avoiding tobacco, drugs, excessive alcohol consumption, and sexually transmitted infections.    GENERAL RECOMMENDATIONS FOR GOOD HEALTH:  Healthy diet:  Eat a variety of foods, including fruit, vegetables, animal or vegetable protein, such as meat, fish, chicken, and eggs, or beans, lentils, tofu, and grains, such as rice.  Drink plenty of water daily.  Decrease saturated fat in the diet, avoid lots of red meat, processed foods, sweets, fast foods, and fried foods.  Exercise:  Aerobic exercise helps maintain good heart health. At least 30-40 minutes of moderate-intensity exercise is recommended. For example, a brisk walk that increases your heart rate and breathing. This should be done on most days of the week.   Find a type of exercise or a variety of exercises that you enjoy so  that it becomes a part of your daily life.  Examples are running, walking, swimming, water aerobics, and biking.  For motivation and support, explore group exercise such as aerobic class, spin class, Zumba, Yoga,or  martial arts, etc.    Set exercise goals for yourself, such as a certain weight goal, walk or run in a race such as a 5k walk/run.  Speak to your primary care provider about exercise goals.  Disease prevention:  If you smoke or chew tobacco, find out from your caregiver how to quit. It can literally save your life, no matter how long you have been a tobacco user. If you do not use tobacco, never begin.   Maintain a healthy diet and normal weight. Increased weight leads to problems with blood pressure and diabetes.   The Body Mass Index or BMI is a  way of measuring how much of your body is fat. Having a BMI above 27 increases the risk of heart disease, diabetes, hypertension, stroke and other problems related to obesity. Your caregiver can help determine your BMI and based on it develop an exercise and dietary program to help you achieve or maintain this important measurement at a healthful level.  High blood pressure causes heart and blood vessel problems.  Persistent high blood pressure should be treated with medicine if weight loss and exercise do not work.   Fat and cholesterol leaves deposits in your arteries that can block them. This causes heart disease and vessel disease elsewhere in your body.  If your cholesterol is found to be high, or if you have heart disease or certain other medical conditions, then you may need to have your cholesterol monitored frequently and be treated with medication.   Ask if you should have a cardiac stress test if your history suggests this. A stress test is a test done on a treadmill that looks for heart disease. This test can find disease prior to there being a problem.  Osteoporosis is a disease in which the bones lose minerals and strength as we age. This can result in serious bone fractures. Risk of osteoporosis can be identified using a bone density scan. Men ages 86 and over should discuss this with their caregivers. Ask your caregiver whether you should be taking a calcium supplement and Vitamin D, to reduce the rate of osteoporosis.   Avoid drinking alcohol in excess (more than two drinks per day).  Avoid use of street drugs. Do not share needles with anyone. Ask for professional help if you need assistance or instructions on stopping the use of alcohol, cigarettes, and/or drugs.  Brush your teeth twice a day with fluoride toothpaste, and floss once a day. Good oral hygiene prevents tooth decay and gum disease. The problems can be painful, unattractive, and can cause other health problems. Visit your  dentist for a routine oral and dental check up and preventive care every 6-12 months.   Look at your skin regularly.  Use a mirror to look at your back. Notify your caregivers of changes in moles, especially if there are changes in shapes, colors, a size larger than a pencil eraser, an irregular border, or development of new moles.  Safety:  Use seatbelts 100% of the time, whether driving or as a passenger.  Use safety devices such as hearing protection if you work in environments with loud noise or significant background noise.  Use safety glasses when doing any work that could send debris in to the eyes.  Use a helmet  if you ride a bike or motorcycle.  Use appropriate safety gear for contact sports.  Talk to your caregiver about gun safety.  Use sunscreen with a SPF (or skin protection factor) of 15 or greater.  Lighter skinned people are at a greater risk of skin cancer. Don't forget to also wear sunglasses in order to protect your eyes from too much damaging sunlight. Damaging sunlight can accelerate cataract formation.   Practice safe sex. Use condoms. Condoms are used for birth control and to help reduce the spread of sexually transmitted infections (or STIs).  Some of the STIs are gonorrhea (the clap), chlamydia, syphilis, trichomonas, herpes, HPV (human papilloma virus) and HIV (human immunodeficiency virus) which causes AIDS. The herpes, HIV and HPV are viral illnesses that have no cure. These can result in disability, cancer and death.   Keep carbon monoxide and smoke detectors in your home functioning at all times. Change the batteries every 6 months or use a model that plugs into the wall.   Vaccinations:  Stay up to date with your tetanus shots and other required immunizations. You should have a booster for tetanus every 10 years. Be sure to get your flu shot every year, since 5%-20% of the [place] population comes down with the flu. The flu vaccine changes each year, so being vaccinated  once is not enough. Get your shot in the fall, before the flu season peaks.   Other vaccines to consider:  Human Papilloma Virus or HPV causes cancer of the cervix, and other infections that can be transmitted from person to person. There is a vaccine for HPV, and males should get immunized between the ages of 38 and 10. It requires a series of 3 shots.   Pneumococcal vaccine to protect against certain types of pneumonia.  This is normally recommended for adults age 48 or older.  However, adults younger than 34 years old with certain underlying conditions such as diabetes, heart or lung disease should also receive the vaccine.  Shingles vaccine to protect against Varicella Zoster if you are older than age 88, or younger than 34 years old with certain underlying illness.  If you have not had the Shingrix vaccine, please call your insurer to inquire about coverage for the Shingrix vaccine given in 2 doses.   Some insurers cover this vaccine after age 77, some cover this after age 53.  If your insurer covers this, then call to schedule appointment to have this vaccine here  Hepatitis A vaccine to protect against a form of infection of the liver by a virus acquired from food.  Hepatitis B vaccine to protect against a form of infection of the liver by a virus acquired from blood or body fluids, particularly if you work in health care.  If you plan to travel internationally, check with your local health department for specific vaccination recommendations.   What should I know about cancer screening? Many types of cancers can be detected early and may often be prevented. Lung Cancer  You should be screened every year for lung cancer if: ? You are a current smoker who has smoked for at least 30 years. ? You are a former smoker who has quit within the past 15 years.  Talk to your health care provider about your screening options, when you should start screening, and how often you should be  screened.  Colorectal Cancer  Routine colorectal cancer screening usually begins at 34 years of age and should be repeated every 5-10 years  until you are 34 years old. You may need to be screened more often if early forms of precancerous polyps or small growths are found. Your health care provider may recommend screening at an earlier age if you have risk factors for colon cancer.  Your health care provider may recommend using home test kits to check for hidden blood in the stool.  A small camera at the end of a tube can be used to examine your colon (sigmoidoscopy or colonoscopy). This checks for the earliest forms of colorectal cancer.  Prostate and Testicular Cancer  Depending on your age and overall health, your health care provider may do certain tests to screen for prostate and testicular cancer.  Talk to your health care provider about any symptoms or concerns you have about testicular or prostate cancer.  Skin Cancer  Check your skin from head to toe regularly.  Tell your health care provider about any new moles or changes in moles, especially if: ? There is a change in a mole's size, shape, or color. ? You have a mole that is larger than a pencil eraser.  Always use sunscreen. Apply sunscreen liberally and repeat throughout the day.  Protect yourself by wearing long sleeves, pants, a wide-brimmed hat, and sunglasses when outside.

## 2017-07-10 NOTE — Progress Notes (Signed)
Subjective:   HPI  Victor Jensen is a 32 y.o. male who presents for Chief Complaint  Patient presents with  . other    cpe fasting    Medical care team includes: Tanyia Grabbe, Kermit Balo, PA-C here for primary care Dentist Cardiology Dr. Rollene Rotunda  Concerns: He notes feet issues, particular left foot.  He exercises on treadmill or plays basketball 1.5 hour every day, walks on cement floors daily.  This is a big issue for him.     Having worse acne of late, issue on back of scalp.  Wants cream refilled that dermatology gave in the past.  Has hx/o steroid injections in back of scalp.   In summer time gets some bumps on upper and lower arms.  Worse with direct sun exposure  Reviewed their medical, surgical, family, social, medication, and allergy history and updated chart as appropriate.  Past Medical History:  Diagnosis Date  . Allergic rhinitis   . Allergy   . Family history of premature CAD    father, died MI in early 23s  . H/O cardiovascular stress test 01/2015   LVH, EF 65-70%, but other imaging advised, Dr. Antoine Poche  . History of MRI of chest    cardiac MRI.  Concentric LVH, mildly dilated LV. Dr. Antoine Poche  . History of sleep disturbance 04/2015   equivocal sleep study 04/2015  . Obesity     Past Surgical History:  Procedure Laterality Date  . NO PAST SURGERIES  06/2015  . None      Social History   Socioeconomic History  . Marital status: Married    Spouse name: Not on file  . Number of children: 2  . Years of education: Not on file  . Highest education level: Not on file  Occupational History  . Not on file  Social Needs  . Financial resource strain: Not on file  . Food insecurity:    Worry: Not on file    Inability: Not on file  . Transportation needs:    Medical: Not on file    Non-medical: Not on file  Tobacco Use  . Smoking status: Former Smoker    Packs/day: 0.50    Years: 3.00    Pack years: 1.50  . Smokeless tobacco: Never Used   Substance and Sexual Activity  . Alcohol use: No    Alcohol/week: 0.0 oz  . Drug use: Yes    Types: Marijuana    Comment: rare  . Sexual activity: Not on file  Lifestyle  . Physical activity:    Days per week: Not on file    Minutes per session: Not on file  . Stress: Not on file  Relationships  . Social connections:    Talks on phone: Not on file    Gets together: Not on file    Attends religious service: Not on file    Active member of club or organization: Not on file    Attends meetings of clubs or organizations: Not on file    Relationship status: Not on file  . Intimate partner violence:    Fear of current or ex partner: Not on file    Emotionally abused: Not on file    Physically abused: Not on file    Forced sexual activity: Not on file  Other Topics Concern  . Not on file  Social History Narrative   Works for [employer], works at [employer] center.  Lives with two kids and wife.  Exercise - cardio,  weights, 5 days per week.   As of 06/2017    Family History  Problem Relation Age of Onset  . Heart attack Father [id number]  . Diabetes Father        Severe  . Heart disease Father [phone]  . Diabetes Paternal Grandfather   . Diabetes Maternal Grandfather   . Heart disease Maternal Grandfather   . Cancer Other   . Diabetes Maternal Grandmother      Current Outpatient Medications:  .  hydrochlorothiazide (HYDRODIURIL) 12.5 MG tablet, Take 1 tablet (12.5 mg total) by mouth daily., Disp: 90 tablet, Rfl: 3 .  clindamycin (CLINDAGEL) 1 % gel, Apply topically 2 (two) times daily., Disp: 30 g, Rfl: 2  No Known Allergies   Review of Systems Constitutional: -fever, -chills, -sweats, -unexpected weight change, -decreased appetite, -fatigue Allergy: -sneezing, -itching, -congestion Dermatology: -changing moles, +rash, -lumps ENT: -runny nose, -ear pain, -sore throat, -hoarseness, -sinus pain, -teeth pain, - ringing in ears, -hearing loss, -nosebleeds Cardiology: -chest pain,  -palpitations, -swelling, -difficulty breathing when lying flat, -waking up short of breath Respiratory: -cough, -shortness of breath, -difficulty breathing with exercise or exertion, -wheezing, -coughing up blood Gastroenterology: -abdominal pain, -nausea, -vomiting, -diarrhea, -constipation, -blood in stool, -changes in bowel movement, -difficulty swallowing or eating Hematology: -bleeding, -bruising  Musculoskeletal: +feet pain, -joint aches, -muscle aches, -joint swelling, -back pain, -neck pain, -cramping, -changes in gait Ophthalmology: denies vision changes, eye redness, itching, discharge Urology: -burning with urination, -difficulty urinating, -blood in urine, -urinary frequency, -urgency, -incontinence Neurology: -headache, -weakness, -tingling, -numbness, -memory loss, -falls, -dizziness Psychology: -depressed mood, -agitation, -sleep problems Male GU: no testicular mass, pain, no lymph nodes swollen, no swelling, no rash.     Objective:  BP 130/86 (BP Location: Left Arm, Patient Position: Sitting)   Pulse 60   Temp 97.6 F (36.4 C)   Ht 5' 10.5" (1.791 m)   Wt 272 lb 12.8 oz (123.7 kg)   SpO2 95%   BMI 38.59 kg/m   General appearance: alert, no distress, WD/WN, African American male Skin: moderate acne of face, some small fine papular lesions of bilat upper arms, thickened mass of base of skull keloid HEENT: normocephalic, conjunctiva/corneas normal, sclerae anicteric, PERRLA, EOMi, nares patent, no discharge or erythema, pharynx normal Oral cavity: MMM, tongue normal, teeth in good repair, upper plate Neck: supple, no lymphadenopathy, no thyromegaly, no masses, normal ROM, no bruits Chest: non tender, normal shape and expansion Heart: RRR, normal S1, S2, no murmurs Lungs: CTA bilaterally, no wheezes, rhonchi, or rales Abdomen: +bs, soft, non tender, non distended, no masses, no hepatomegaly, no splenomegaly, no bruits Back: non tender, normal ROM, no  scoliosis Musculoskeletal: flat arches bilat, tender MTPs of great toes bilat, otherwise upper extremities non tender, no obvious deformity, normal ROM throughout, lower extremities non tender, no obvious deformity, normal ROM throughout Extremities: no edema, no cyanosis, no clubbing Pulses: 2+ symmetric, upper and lower extremities, normal cap refill Neurological: alert, oriented x 3, CN2-12 intact, strength normal upper extremities and lower extremities, sensation normal throughout, DTRs 2+ throughout, no cerebellar signs, gait normal Psychiatric: normal affect, behavior normal, pleasant  GU: normal male external genitalia,circumcised, nontender, no masses, no hernia, no lymphadenopathy Rectal: deferred   Assessment and Plan :   Encounter Diagnoses  Name Primary?  . Encounter for health maintenance examination in adult Yes  . Essential hypertension, benign   . LVH (left ventricular hypertrophy)   . Impaired fasting blood sugar   . Elevated uric acid in blood   .  Obesity with serious comorbidity, unspecified classification, unspecified obesity type   . Family history of premature CAD   . Family history of diabetes mellitus   . Vaccine counseling   . Plantar fasciitis   . Acne, unspecified acne type     Physical exam - discussed and counseled on healthy lifestyle, diet, exercise, preventative care, vaccinations, sick and well care, proper use of emergency dept and after hours care, and addressed their concerns.    Health screening: See your eye doctor yearly for routine vision care. See your dentist yearly for routine dental care including hygiene visits twice yearly.  Cancer screening Advised monthly self testicular exam   Vaccinations: Advised yearly influenza vaccine  We will request vaccine records from employee health, [place]  Acute issues discussed: none  Separate significant chronic issues discussed: Feet pain - plantar fascitis.   Discussed daily foot  stretch, tennis ball massages, avoid barefooted, begin OTC 90 degree splints during sleep.  F/u 11mo  Rash - upper arms, can also use clindamycin for this daily.  Acne - refilled clindamycin topical to use daily for prevention   [place] was seen today for other.  Diagnoses and all orders for this visit:  Encounter for health maintenance examination in adult -     Microalbumin / creatinine urine ratio -     Lipid panel -     Hemoglobin A1c -     Uric acid -     POCT Urinalysis DIP (Proadvantage Device)  Essential hypertension, benign  LVH (left ventricular hypertrophy)  Impaired fasting blood sugar  Elevated uric acid in blood  Obesity with serious comorbidity, unspecified classification, unspecified obesity type  Family history of premature CAD  Family history of diabetes mellitus  Vaccine counseling  Plantar fasciitis  Acne, unspecified acne type  Other orders -     clindamycin (CLINDAGEL) 1 % gel; Apply topically 2 (two) times daily. -     hydrochlorothiazide (HYDRODIURIL) 12.5 MG tablet; Take 1 tablet (12.5 mg total) by mouth daily.   Follow-up pending labs, yearly for physical

## 2017-07-11 LAB — MICROALBUMIN / CREATININE URINE RATIO
CREATININE, UR: 159.6 mg/dL
MICROALB/CREAT RATIO: 3.2 mg/g{creat} (ref 0.0–30.0)
Microalbumin, Urine: 5.1 ug/mL

## 2017-07-11 LAB — HEMOGLOBIN A1C
ESTIMATED AVERAGE GLUCOSE: 114 mg/dL
HEMOGLOBIN A1C: 5.6 % (ref 4.8–5.6)

## 2017-07-11 LAB — LIPID PANEL
CHOLESTEROL TOTAL: 186 mg/dL (ref 100–199)
Chol/HDL Ratio: 3.6 ratio (ref 0.0–5.0)
HDL: 51 mg/dL (ref >39)
LDL Calculated: 117 mg/dL — ABNORMAL HIGH (ref 0–99)
TRIGLYCERIDES: 91 mg/dL (ref 0–149)
VLDL Cholesterol Cal: 18 mg/dL (ref 5–40)

## 2017-07-11 LAB — URIC ACID: Uric Acid: 8.5 mg/dL (ref 3.7–8.6)

## 2017-07-15 ENCOUNTER — Other Ambulatory Visit: Payer: Self-pay | Admitting: Medical

## 2017-07-20 ENCOUNTER — Telehealth: Payer: Self-pay | Admitting: Medical

## 2017-07-20 NOTE — Telephone Encounter (Signed)
Received vaccine information from [employer] Employee health. Sending the CMA to abstract.

## 2017-08-07 ENCOUNTER — Other Ambulatory Visit: Payer: Self-pay

## 2017-08-07 ENCOUNTER — Telehealth: Payer: Self-pay | Admitting: Medical

## 2017-08-07 DIAGNOSIS — I517 Cardiomegaly: Secondary | ICD-10-CM

## 2017-08-07 DIAGNOSIS — Z8249 Family history of ischemic heart disease and other diseases of the circulatory system: Secondary | ICD-10-CM

## 2017-08-07 NOTE — Telephone Encounter (Signed)
Pt called stating that Vincenza HewsShane advised him that he would be sending a referral to his cardiologist so pt can be seen again. Pt has been to the cardiology office on [address] and would like for our office to send the referral overbefore he calls to make an appointment

## 2017-08-07 NOTE — Telephone Encounter (Signed)
Left message for patient to call back,  referral has been sent in to cardiology.

## 2017-09-12 NOTE — Progress Notes (Signed)
Cardiology Office Note   Date:  09/13/2017   ID:  Victor Jensen, DOB [birth year], MRN [id number]  PCP:  Victor Jensen, Victor Jensen, Victor Jensen  Cardiologist:   Rollene RotundaJames Tattiana Fakhouri, MD   Chief Complaint  Patient presents with  . Edema      History of Present Illness Victor Jensen is a 32 y.o. male who presents for evaluation of an abnormal EKG and chest pain. He has no past cardiac history. I saw him in 2017 after he developed some shortness of breath.   I sent him for a dobutamine echo because he has a markedly abnormal EKG.  With this he had a hypertensive blood pressure response. He also had concentric LVH.  I managed him medically.   He had an MRI with an EF of 56% with moderate concentric hypertrophy without gad enhancement.   He has not been back to the clinic in two years.  He was in the ED earlier this year with peripheral edema.   BNP was normal.  He was started on HCTZ.    He returns for follow-up visits been a couple of years.  He is actually doing relatively well.  He works the night shift and he has 2 young children who are active.  He does not get to sleep.  His biggest complaint is actually fatigue.  He does try to exercise.  He lifts weights and he does aerobic.  He can get his weight down but he is trying to be pretty careful about this.  He understands portion size and choice as limitations.  He does not really have a lot of shortness of breath.  He lives on the third floor and he will get short of breath after 3 flights but he does not have any chest discomfort, neck or arm discomfort.  He does not have palpitations, presyncope or syncope.  He does not have PND or orthopnea.   Past Medical History:  Diagnosis Date  . Allergic rhinitis   . Allergy   . Family history of premature CAD    father, died MI in early 6240s  . H/O cardiovascular stress test 01/2015   LVH, EF 65-70%, but other imaging advised, Dr. Antoine PocheHochrein  . History of MRI of chest    cardiac MRI.  Concentric LVH, mildly  dilated LV. Dr. Antoine PocheHochrein  . History of sleep disturbance 04/2015   equivocal sleep study 04/2015  . Obesity     Past Surgical History:  Procedure Laterality Date  . NO PAST SURGERIES  06/2015  . None       Current Outpatient Medications  Medication Sig Dispense Refill  . clindamycin (CLINDAGEL) 1 % gel Apply topically 2 (two) times daily. 30 g 2  . hydrochlorothiazide (HYDRODIURIL) 12.5 MG tablet Take 1 tablet (12.5 mg total) by mouth daily. 90 tablet 3   No current facility-administered medications for this visit.     Allergies:   Patient has no known allergies.     ROS:  Please see the history of present illness.   Otherwise, review of systems are positive for none.   All other systems are reviewed and negative.    PHYSICAL EXAM: VS:  BP 132/80 (BP Location: Left Arm, Patient Position: Sitting)   Pulse 80   Ht 5\' 10"  (1.778 m)   Wt 280 lb (127 kg)   SpO2 96%   BMI 40.18 kg/m  , BMI Body mass index is 40.18 kg/m. GENERAL:  Well appearing NECK:  No jugular  venous distention, waveform within normal limits, carotid upstroke brisk and symmetric, no bruits, no thyromegaly LUNGS:  Clear to auscultation bilaterally CHEST:  Unremarkable HEART:  PMI not displaced or sustained,S1 and S2 within normal limits, no S3, no S4, no clicks, no rubs, no murmurs ABD:  Flat, positive bowel sounds normal in frequency in pitch, no bruits, no rebound, no guarding, no midline pulsatile mass, no hepatomegaly, no splenomegaly EXT:  2 plus pulses throughout, no edema, no cyanosis no clubbing    EKG:  EKG is not ordered today. 05/19/17 sinus rhythm, rate 57, axis within normal limits, intervals within normal limits, T wave inversions consistent with LVH and unchanged from previous 09/13/2017   Recent Labs: 05/19/2017: BUN 12; Creatinine, Ser 1.12; Hemoglobin 13.5; Platelets 192; Potassium 3.5; Sodium 138 05/20/2017: ALT 27; B Natriuretic Peptide 31.3    Lipid Panel    Component Value Date/Time    CHOL 186 07/10/2017 0928   TRIG 91 07/10/2017 0928   HDL 51 07/10/2017 0928   CHOLHDL 3.6 07/10/2017 0928   CHOLHDL 3.4 07/23/2015 0927   VLDL 11 07/23/2015 0927   LDLCALC 117 (H) 07/10/2017 0928      Wt Readings from Last 3 Encounters:  09/13/17 280 lb (127 kg)  07/10/17 272 lb 12.8 oz (123.7 kg)  06/12/17 272 lb (123.4 kg)      Other studies Reviewed: Additional studies/ records that were reviewed today include: ED records Review of the above records demonstrates:      ASSESSMENT AND PLAN:  LVH:   He needs follow up echo.   No change in therapy.  BP is controlled.   HTN:   The blood pressure is at target. No change in medications is indicated. We will continue with therapeutic lifestyle changes (TLC).  POOR SLEEP:  I had suggested a sleep study.  We talked about sleep hygiene.  It is difficult with his third shift work schedule and his young children but he understands the importance of this.  Current medicines are reviewed at length with the patient today.  The patient does not have concerns regarding medicines.  The following changes have been made:  none  Labs/ tests ordered today include:   None   Orders Placed This Encounter  Procedures  . Basic Metabolic Panel (BMET)  . ECHOCARDIOGRAM COMPLETE     Disposition:   FU with me in two years    Signed, Rollene RotundaJames Dejon Lukas, MD  09/13/2017 10:39 AM    [facility]

## 2017-09-13 ENCOUNTER — Ambulatory Visit: Payer: 59 | Admitting: Cardiology

## 2017-09-13 ENCOUNTER — Encounter: Payer: Self-pay | Admitting: Cardiology

## 2017-09-13 VITALS — BP 132/80 | HR 80 | Ht 70.0 in | Wt 280.0 lb

## 2017-09-13 DIAGNOSIS — M7989 Other specified soft tissue disorders: Secondary | ICD-10-CM | POA: Diagnosis not present

## 2017-09-13 DIAGNOSIS — I517 Cardiomegaly: Secondary | ICD-10-CM

## 2017-09-13 DIAGNOSIS — I10 Essential (primary) hypertension: Secondary | ICD-10-CM

## 2017-09-13 NOTE — Patient Instructions (Addendum)
Medication Instructions:  Continue current medications  If you need a refill on your cardiac medications before your next appointment, please call your pharmacy.  Labwork: BMP  Testing/Procedures: Your physician has requested that you have an echocardiogram. Echocardiography is a painless test that uses sound waves to create images of your heart. It provides your doctor with information about the size and shape of your heart and how well your heart's chambers and valves are working. This procedure takes approximately one hour. There are no restrictions for this procedure.   Follow-Up: Your physician wants you to follow-up in: 2 Years. You should receive a reminder letter in the mail two months in advance. If you do not receive a letter, please call our office in [phone] to schedule your follow-up appointment.     Thank you for choosing [facility]

## 2017-09-18 ENCOUNTER — Ambulatory Visit: Payer: 59 | Admitting: Medical

## 2017-09-18 DIAGNOSIS — M779 Enthesopathy, unspecified: Secondary | ICD-10-CM | POA: Diagnosis not present

## 2017-09-18 DIAGNOSIS — M79672 Pain in left foot: Secondary | ICD-10-CM | POA: Diagnosis not present

## 2017-09-18 DIAGNOSIS — M2142 Flat foot [pes planus] (acquired), left foot: Secondary | ICD-10-CM | POA: Diagnosis not present

## 2017-09-20 ENCOUNTER — Other Ambulatory Visit (HOSPITAL_COMMUNITY): Payer: 59

## 2017-09-27 MED FILL — IBUPROFEN 800 MG TAB: 800 | 30 days supply | Qty: 90 | Fill #0

## 2017-09-28 ENCOUNTER — Encounter: Payer: Self-pay | Admitting: Podiatry

## 2017-09-28 ENCOUNTER — Ambulatory Visit: Payer: 59 | Admitting: Podiatry

## 2017-09-28 ENCOUNTER — Other Ambulatory Visit: Payer: Self-pay | Admitting: Podiatry

## 2017-09-28 ENCOUNTER — Ambulatory Visit (INDEPENDENT_AMBULATORY_CARE_PROVIDER_SITE_OTHER): Payer: 59

## 2017-09-28 DIAGNOSIS — M79671 Pain in right foot: Secondary | ICD-10-CM | POA: Diagnosis not present

## 2017-09-28 DIAGNOSIS — M779 Enthesopathy, unspecified: Secondary | ICD-10-CM | POA: Diagnosis not present

## 2017-09-28 DIAGNOSIS — M79672 Pain in left foot: Secondary | ICD-10-CM

## 2017-09-28 DIAGNOSIS — M1000 Idiopathic gout, unspecified site: Secondary | ICD-10-CM | POA: Diagnosis not present

## 2017-09-28 DIAGNOSIS — M7752 Other enthesopathy of left foot: Secondary | ICD-10-CM

## 2017-09-28 MED ORDER — DICLOFENAC SODIUM 75 MG PO TBEC: 75.0000 mg | DELAYED_RELEASE_TABLET | Freq: Two times a day (BID) | ORAL | 2 refills | Status: DC

## 2017-09-28 MED ORDER — TRIAMCINOLONE ACETONIDE 10 MG/ML IJ SUSP
10.0000 mg | Freq: Once | INTRAMUSCULAR | Status: AC
Start: 2017-09-28 — End: 2017-09-28
  Administered 2017-09-28: 10 mg

## 2017-09-28 MED FILL — DICLOFENAC SOD EC 75 MG TAB: 75 | 25 days supply | Qty: 50 | Fill #0

## 2017-09-28 NOTE — Patient Instructions (Signed)

## 2017-09-30 NOTE — Progress Notes (Signed)
Subjective:   Patient ID: Victor Jensen, male   DOB: 32 y.o.   MRN: [id number]   HPI Patient presents stating he is been getting a lot of pain around his left big toe joint and it seems that it comes and goes and is been given tentative diagnosis of gout but he did have low uric acid when tested.  Patient does not smoke currently likes to be active and likes to lift weights and is moderately obese   Review of Systems  All other systems reviewed and are negative.       Objective:  Physical Exam  Constitutional: He appears well-developed and well-nourished.  Cardiovascular: Intact distal pulses.  Pulmonary/Chest: Effort normal.  Musculoskeletal: Normal range of motion.  Neurological: He is alert.  Skin: Skin is warm.  Nursing note and vitals reviewed.   Neurovascular status intact muscle strength is adequate range of motion within normal limits with patient noted to have inflammation and pain of the first MPJ of the left foot with fluid buildup around the joint but no indications of acute process associated with systemic disease.  Patient has no other current pathology that appears to be significant at the current time     Assessment:  Possibility that there is still may be impaired uric acid but also may be dealing with inflammatory condition around the joint that is localized in nature     Plan:  H&P and reviewed meds and condition.  I do think he should get off of his diuretic and is good to work with his physician on this and today and go to work on this locally and I did inject around the first MPJ 3 mg Kenalog 5 mg Xylocaine advised on wider shoes and will reevaluate again in 3 weeks.  May require other treatments depending on response  X-ray indicates that there is no signs that there is a lytic type process with inflammation noted around the first MPJ

## 2017-10-04 ENCOUNTER — Ambulatory Visit (HOSPITAL_COMMUNITY): Payer: 59 | Attending: Cardiology

## 2017-10-04 ENCOUNTER — Other Ambulatory Visit: Payer: Self-pay

## 2017-10-04 DIAGNOSIS — I517 Cardiomegaly: Secondary | ICD-10-CM | POA: Diagnosis not present

## 2017-10-04 DIAGNOSIS — Z6841 Body Mass Index (BMI) 40.0 and over, adult: Secondary | ICD-10-CM | POA: Insufficient documentation

## 2017-10-04 DIAGNOSIS — E669 Obesity, unspecified: Secondary | ICD-10-CM | POA: Insufficient documentation

## 2017-10-04 DIAGNOSIS — Z8249 Family history of ischemic heart disease and other diseases of the circulatory system: Secondary | ICD-10-CM | POA: Insufficient documentation

## 2017-10-11 ENCOUNTER — Encounter: Payer: Self-pay | Admitting: *Deleted

## 2017-10-18 ENCOUNTER — Ambulatory Visit: Payer: 59 | Admitting: Podiatry

## 2017-11-01 ENCOUNTER — Encounter

## 2017-11-01 ENCOUNTER — Ambulatory Visit: Payer: 59 | Admitting: Podiatry

## 2017-11-09 ENCOUNTER — Ambulatory Visit: Payer: 59 | Admitting: Medical

## 2017-11-09 ENCOUNTER — Encounter: Payer: Self-pay | Admitting: Medical

## 2017-11-09 VITALS — BP 138/90 | HR 82 | Temp 98.6°F | Resp 16 | Ht 71.0 in | Wt 285.6 lb

## 2017-11-09 DIAGNOSIS — M79672 Pain in left foot: Secondary | ICD-10-CM | POA: Diagnosis not present

## 2017-11-09 DIAGNOSIS — I10 Essential (primary) hypertension: Secondary | ICD-10-CM | POA: Diagnosis not present

## 2017-11-09 DIAGNOSIS — I517 Cardiomegaly: Secondary | ICD-10-CM

## 2017-11-09 DIAGNOSIS — M109 Gout, unspecified: Secondary | ICD-10-CM | POA: Diagnosis not present

## 2017-11-09 MED ORDER — HYDROCODONE-ACETAMINOPHEN 7.5-325 MG PO TABS: 1.0000 | ORAL_TABLET | Freq: Four times a day (QID) | ORAL | 0 refills | Status: DC | PRN

## 2017-11-09 MED ORDER — DICLOFENAC SODIUM 75 MG PO TBEC: 75.0000 mg | DELAYED_RELEASE_TABLET | Freq: Two times a day (BID) | ORAL | 0 refills | Status: DC

## 2017-11-09 MED ORDER — ALLOPURINOL 100 MG PO TABS: 100.0000 mg | ORAL_TABLET | Freq: Every day | ORAL | 2 refills | Status: DC

## 2017-11-09 MED ORDER — HYDROCODONE-ACETAMINOPHEN 7.5-325 MG PO TABS
1.0000 | ORAL_TABLET | Freq: Four times a day (QID) | ORAL | 0 refills | Status: AC | PRN
Start: 2017-11-09 — End: 2017-11-14

## 2017-11-09 MED ORDER — LOSARTAN POTASSIUM 50 MG PO TABS: 50.0000 mg | ORAL_TABLET | Freq: Every day | ORAL | 3 refills | Status: DC

## 2017-11-09 MED FILL — HYDROCODON-APAP 7.5-325: 7.5-325 | 2 days supply | Qty: 10 | Fill #0

## 2017-11-09 MED FILL — LOSARTAN POTASSIUM 50 MG TA: 50 | 90 days supply | Qty: 90 | Fill #0

## 2017-11-09 MED FILL — DICLOFENAC SOD EC 75 MG TAB: 75 | 15 days supply | Qty: 30 | Fill #0

## 2017-11-09 MED FILL — ALLOPURINOL 100 MG TABLET: 100 | 30 days supply | Qty: 30 | Fill #0

## 2017-11-09 NOTE — Progress Notes (Signed)
Subjective: Chief Complaint  Patient presents with  . foot pain    left foot pain gout X 1 day   Here today for left foot pain without injury.  He has had 2 or 3 episodes of this in recent months considered gout.  Saw foot doctor recently had an injection into his foot.  Things were fine until 2 days ago when he started getting pain again.  He has not had an injury or trauma or fall.  He can barely walk on the left foot pain and cannot put on his shoe due to the pain.  His hydrochlorothiazide and losartan blood pressure pill was stopped recently due to potential for elevating the uric acid and causing gout.  He currently is not taking blood pressure medicine.  His home blood pressure readings have been increasing.  He saw a urgent care in the last few weeks for gout flare was given prednisone which helped quite a bit.  No fever no other joint pain no other complaint.  Past Medical History:  Diagnosis Date  . Allergic rhinitis   . Allergy   . Family history of premature CAD    father, died MI in early 37s  . H/O cardiovascular stress test 01/2015   LVH, EF 65-70%, but other imaging advised, Dr. Antoine Poche  . History of MRI of chest    cardiac MRI.  Concentric LVH, mildly dilated LV. Dr. Antoine Poche  . History of sleep disturbance 04/2015   equivocal sleep study 04/2015  . Obesity    No current outpatient medications on file prior to visit.   No current facility-administered medications on file prior to visit.    ROS as in subjective    Objective: BP 138/90   Pulse 82   Temp 98.6 F (37 C) (Oral)   Resp 16   Ht 5\' 11"  (1.803 m)   Wt 285 lb 9.6 oz (129.5 kg)   SpO2 97%   BMI 39.83 kg/m   BP Readings from Last 3 Encounters:  11/09/17 138/90  09/13/17 132/80  07/10/17 130/86   Wt Readings from Last 3 Encounters:  11/09/17 285 lb 9.6 oz (129.5 kg)  09/13/17 280 lb (127 kg)  07/10/17 272 lb 12.8 oz (123.7 kg)    Gen:wd, wn, nad Tender over left midfoot, seems slightly swollen,  in general.  No other tenderness or deformity.  Pain with toe range of motion Leg neurovascularly intact Seated with crutches and postop shoe on the left foot    Assessment: Encounter Diagnoses  Name Primary?  . Acute gout of left foot, unspecified cause Yes  . Foot pain, left   . LVH (left ventricular hypertrophy)   . Essential hypertension      Plan: I reviewed his recent podiatry notes.   His recent uric acid was over 8.  Discussed the recommendations below and follow-up.  Recommendations: For the acute pain you can use hydrocodone/Norco 1 tablet every 4-6 hours as needed for breakthrough pain  Go ahead and use diclofenac anti-inflammatory and pain medicine 1 tablet twice daily until you run out of the current bottle  Overlap diclofenac and hydrocodone by at least 4 hours  Do not use ibuprofen and diclofenac at the same time as they are for the same purpose  Begin allopurinol 100 mg 1 tablet daily to reduce uric acid which is likely triggering some of these gout flares.  We will continue this for the next several months  Begin losartan blood pressure pill 1 tablet daily.  This does not have the fluid pill and it which can also trigger gout  Thus in summary, losartan and allopurinol are ongoing prescriptions to take daily, but the diclofenac is short-term to reduce flareup.  The hydrocodone is only for breakthrough pain when needed this is not meant to be an ongoing prescription  Plan to recheck nurse visit for lab for uric acid in about 8 weeks  Corde was seen today for foot pain.  Diagnoses and all orders for this visit:  Acute gout of left foot, unspecified cause -     Uric acid; Future  Foot pain, left -     Uric acid; Future  LVH (left ventricular hypertrophy)  Essential hypertension  Other orders -     allopurinol (ZYLOPRIM) 100 MG tablet; Take 1 tablet (100 mg total) by mouth daily. -     Discontinue: HYDROcodone-acetaminophen (NORCO) 7.5-325 MG tablet;  Take 1 tablet by mouth every 6 (six) hours as needed for up to 5 days for moderate pain. -     losartan (COZAAR) 50 MG tablet; Take 1 tablet (50 mg total) by mouth daily. -     diclofenac (VOLTAREN) 75 MG EC tablet; Take 1 tablet (75 mg total) by mouth 2 (two) times daily. -     HYDROcodone-acetaminophen (NORCO) 7.5-325 MG tablet; Take 1 tablet by mouth every 6 (six) hours as needed for up to 5 days for moderate pain.

## 2017-11-09 NOTE — Patient Instructions (Signed)
For the acute pain you can use hydrocodone/Norco 1 tablet every 4-6 hours as needed for breakthrough pain  Go ahead and use diclofenac anti-inflammatory and pain medicine 1 tablet twice daily until you run out of the current bottle  Overlap diclofenac and hydrocodone by at least 4 hours  Do not use ibuprofen and diclofenac at the same time as they are for the same purpose  Begin allopurinol 100 mg 1 tablet daily to reduce uric acid which is likely triggering some of these gout flares.  We will continue this for the next several months  Begin losartan blood pressure pill 1 tablet daily.  This does not have the fluid pill and it which can also trigger gout  Thus in summary, losartan and allopurinol are ongoing prescriptions to take daily, but the diclofenac is short-term to reduce flareup.  The hydrocodone is only for breakthrough pain when needed this is not meant to be an ongoing prescription  Plan to recheck nurse visit for lab for uric acid in about 8 weeks    Gout Gout is painful swelling that can happen in some of your joints. Gout is a type of arthritis. This condition is caused by having too much uric acid in your body. Uric acid is a chemical that is made when your body breaks down substances called purines. If your body has too much uric acid, sharp crystals can form and build up in your joints. This causes pain and swelling. Gout attacks can happen quickly and be very painful (acute gout). Over time, the attacks can affect more joints and happen more often (chronic gout). Follow these instructions at home: During a Gout Attack  If directed, put ice on the painful area: ? Put ice in a plastic bag. ? Place a towel between your skin and the bag. ? Leave the ice on for 20 minutes, 2-3 times a day.  Rest the joint as much as possible. If the joint is in your leg, you may be given crutches to use.  Raise (elevate) the painful joint above the level of your heart as often as you  can.  Drink enough fluids to keep your pee (urine) clear or pale yellow.  Take over-the-counter and prescription medicines only as told by your doctor.  Do not drive or use heavy machinery while taking prescription pain medicine.  Follow instructions from your doctor about what you can or cannot eat and drink.  Return to your normal activities as told by your doctor. Ask your doctor what activities are safe for you. Avoiding Future Gout Attacks  Follow a low-purine diet as told by a specialist (dietitian) or your doctor. Avoid foods and drinks that have a lot of purines, such as: ? Liver. ? Kidney. ? Anchovies. ? Asparagus. ? Herring. ? Mushrooms ? Mussels. ? Beer.  Limit alcohol intake to no more than 1 drink a day for nonpregnant women and 2 drinks a day for men. One drink equals 12 oz of beer, 5 oz of wine, or 1 oz of hard liquor.  Stay at a healthy weight or lose weight if you are overweight. If you want to lose weight, talk with your doctor. It is important that you do not lose weight too fast.  Start or continue an exercise plan as told by your doctor.  Drink enough fluids to keep your pee clear or pale yellow.  Take over-the-counter and prescription medicines only as told by your doctor.  Keep all follow-up visits as told by  your doctor. This is important. Contact a doctor if:  You have another gout attack.  You still have symptoms of a gout attack after10 days of treatment.  You have problems (side effects) because of your medicines.  You have chills or a fever.  You have burning pain when you pee (urinate).  You have pain in your lower back or belly. Get help right away if:  You have very bad pain.  Your pain cannot be controlled.  You cannot pee. This information is not intended to replace advice given to you by your health care provider. Make sure you discuss any questions you have with your health care provider. Document Released: 10/19/2007  Document Revised: 06/17/2015 Document Reviewed: 10/22/2014 Elsevier Interactive Patient Education  [employer].

## 2017-11-09 NOTE — Addendum Note (Signed)
Addended by: Jac Canavan on: 11/09/2017 12:28 PM   Modules accepted: Level of Service

## 2017-11-30 ENCOUNTER — Ambulatory Visit: Payer: 59 | Admitting: Medical

## 2017-12-04 ENCOUNTER — Encounter: Payer: Self-pay | Admitting: Medical

## 2017-12-04 NOTE — Progress Notes (Signed)
No

## 2018-01-14 ENCOUNTER — Ambulatory Visit: Payer: 59 | Admitting: Medical

## 2018-01-14 ENCOUNTER — Encounter: Payer: Self-pay | Admitting: Medical

## 2018-01-14 VITALS — BP 152/106 | HR 69 | Temp 98.0°F | Wt 288.0 lb

## 2018-01-14 DIAGNOSIS — M79672 Pain in left foot: Secondary | ICD-10-CM | POA: Diagnosis not present

## 2018-01-14 DIAGNOSIS — I517 Cardiomegaly: Secondary | ICD-10-CM

## 2018-01-14 DIAGNOSIS — E669 Obesity, unspecified: Secondary | ICD-10-CM

## 2018-01-14 DIAGNOSIS — E790 Hyperuricemia without signs of inflammatory arthritis and tophaceous disease: Secondary | ICD-10-CM | POA: Diagnosis not present

## 2018-01-14 DIAGNOSIS — I10 Essential (primary) hypertension: Secondary | ICD-10-CM

## 2018-01-14 MED ORDER — HYDROCOD POLST-CPM POLST ER 10-8 MG/5ML PO SUER: 5.0000 mL | Freq: Two times a day (BID) | ORAL | 0 refills | Status: DC

## 2018-01-14 MED ORDER — AZITHROMYCIN 250 MG PO TABS: ORAL_TABLET | ORAL | 0 refills | Status: DC

## 2018-01-14 MED ORDER — AMLODIPINE BESYLATE 10 MG PO TABS: 10.0000 mg | ORAL_TABLET | Freq: Every day | ORAL | 3 refills | Status: DC

## 2018-01-14 MED ORDER — LOSARTAN POTASSIUM 50 MG PO TABS: 50.0000 mg | ORAL_TABLET | Freq: Every day | ORAL | 0 refills | Status: DC

## 2018-01-14 MED ORDER — ALLOPURINOL 300 MG PO TABS: 300.0000 mg | ORAL_TABLET | Freq: Every day | ORAL | 3 refills | Status: DC

## 2018-01-14 MED ORDER — ALBUTEROL SULFATE HFA 108 (90 BASE) MCG/ACT IN AERS: 2.0000 | INHALATION_SPRAY | Freq: Four times a day (QID) | RESPIRATORY_TRACT | 0 refills | Status: DC | PRN

## 2018-01-14 MED FILL — VENTOLIN HFA 90 MCG INHALER: 108 (90 BAS | 25 days supply | Qty: 18 | Fill #0

## 2018-01-14 MED FILL — ALLOPURINOL 300 MG TAB: 300 | 90 days supply | Qty: 90 | Fill #0

## 2018-01-14 MED FILL — AMLODIPINE BESYLATE 10 MG T: 10 | 30 days supply | Qty: 30 | Fill #0

## 2018-01-14 MED FILL — AZITHROMYCIN 250 MG TABLET: 250 | 5 days supply | Qty: 6 | Fill #0

## 2018-01-14 MED FILL — HYDROCODONE-CHLORPHEN ER SU: 10-8 | 10 days supply | Qty: 70 | Fill #0

## 2018-01-14 NOTE — Progress Notes (Signed)
Subjective: Chief Complaint  Patient presents with  . other     cold and bad cough. cant sleep cause of cough. chest pain from cough with yellow mucus  started mid last week.   Here for almost week hx/o cough, chest discomfort, yellow mucous, sore throat, some nausea, vomited twice.   He has had some chills.  Can't get warm, has had sweats.  Denies ear pain, no fever.   No sick contacts.   Been using Nyquil, dayquil.  Can't get rest, coughing all night long.  No other aggravating or relieving factors.  He notes that he quit taking his blood pressure medicine several weeks ago because it made him feel fatigued although he did not call and let us know  He continues to get gout flareups, taking allopurinol 100 mg daily.  He has not been back yet for uric acid repeat.  He is missing work due to all the symptoms above wants an FMLA completed  No other complaint.  Past Medical History:  Diagnosis Date  . Allergic rhinitis   . Allergy   . Family history of premature CAD    father, died MI in early 7140s  . H/O cardiovascular stress test 01/2015   LVH, EF 65-70%, but other imaging advised, Dr. Antoine PocheHochrein  . History of MRI of chest    cardiac MRI.  Concentric LVH, mildly dilated LV. Dr. Antoine PocheHochrein  . History of sleep disturbance 04/2015   equivocal sleep study 04/2015  . Obesity    Current Outpatient Medications on File Prior to Visit  Medication Sig Dispense Refill  . diclofenac (VOLTAREN) 75 MG EC tablet Take 1 tablet (75 mg total) by mouth 2 (two) times daily. 30 tablet 0   No current facility-administered medications on file prior to visit.    ROS as in subjective   Objective: BP (!) 152/106 (BP Location: Left Arm, Patient Position: Sitting)   Pulse 69   Temp 98 F (36.7 C)   Wt 288 lb (130.6 kg)   SpO2 95%   BMI 40.17 kg/m   Wt Readings from Last 3 Encounters:  01/14/18 288 lb (130.6 kg)  11/09/17 285 lb 9.6 oz (129.5 kg)  09/13/17 280 lb (127 kg)   BP Readings from Last 3  Encounters:  01/14/18 (!) 152/106  11/09/17 138/90  09/13/17 132/80   General appearance: alert, no distress, WD/WN,   HEENT: normocephalic, sclerae anicteric, TMs pearly, nares with mild turbinated edema, clear discharge,+ erythema, pharynx normal Oral cavity: MMM, no lesions Neck: supple, shoddy tender anterior lymphadenopathy, no thyromegaly, no masses Heart: RRR, normal S1, S2, no murmurs Lungs: CTA bilaterally, no wheezes, rhonchi, or rales Ext: no edema Pulses: 2+ symmetric, upper and lower extremities, normal cap refill Left great toe and dorsal foot over metatarsals with mild tenderness and mild swelling, but normal ROM of ankle and toes Feet neurovascularly intact   Assessment: Encounter Diagnoses  Name Primary?  . Elevated uric acid in blood Yes  . Essential hypertension, benign   . LVH (left ventricular hypertrophy)   . Obesity with serious comorbidity, unspecified classification, unspecified obesity type   . Left foot pain      Plan: We discussed his acute symptoms and gave the recommendations below including Z-Pak, Tussionex as needed for worse cough at bedtime, DayQuil during the day, rest, hydration  Hypertension, LVH -noncompliance, uncontrolled, fatigue reported with Losartan.  Change to Amlodipine.  Avoid diuretics given gout  Obesity - needs to work on lifestyle changes  Left  foot pain, gout - lab today, increase to Allopurinol 300mg  daily, can use diclofenac prn for flare up   Patient Instructions  Recommendations  Begin Z-Pak antibiotic daily, rest, hydrate well  You can continue DayQuil during your  workday, but begin trial of Tussionex cough syrup at bedtime  You can use the inhaler 1 to 2 puffs 3 times daily or up to every 6 hours as needed for cough shortness of breath or wheezing  If you are not much improved by Wednesday or Thursday then call back  Gout  Increase to allopurinol 3 mg daily for gout prevention  Continue diclofenac as  needed for gout flare   Blood pressure  Begin amlodipine 10 mg daily, but start out 1/2 tablet daily for the first 5 days  After 5 days increase to the full tablet   Plan to recheck in 1 month on blood pressure and gout  You may send in your FMLA paperwork     Christiane HaJonathan was seen today for other.  Diagnoses and all orders for this visit:  Elevated uric acid in blood -     Uric acid  Essential hypertension, benign  LVH (left ventricular hypertrophy)  Obesity with serious comorbidity, unspecified classification, unspecified obesity type  Left foot pain  Other orders -     azithromycin (ZITHROMAX) 250 MG tablet; 2 tablets day 1, then 1 tablet days 2-4 -     chlorpheniramine-HYDROcodone (TUSSIONEX PENNKINETIC ER) 10-8 MG/5ML SUER; Take 5 mLs by mouth 2 (two) times daily. -     albuterol (VENTOLIN HFA) 108 (90 Base) MCG/ACT inhaler; Inhale 2 puffs into the lungs every 6 (six) hours as needed for wheezing or shortness of breath. -     allopurinol (ZYLOPRIM) 300 MG tablet; Take 1 tablet (300 mg total) by mouth daily. -     amLODipine (NORVASC) 10 MG tablet; Take 1 tablet (10 mg total) by mouth daily. -     losartan (COZAAR) 50 MG tablet; Take 1 tablet (50 mg total) by mouth daily.

## 2018-01-14 NOTE — Patient Instructions (Signed)
Recommendations  Begin Z-Pak antibiotic daily, rest, hydrate well  You can continue DayQuil during your  workday, but begin trial of Tussionex cough syrup at bedtime  You can use the inhaler 1 to 2 puffs 3 times daily or up to every 6 hours as needed for cough shortness of breath or wheezing  If you are not much improved by Wednesday or Thursday then call back  Gout  Increase to allopurinol 3 mg daily for gout prevention  Continue diclofenac as needed for gout flare   Blood pressure  Begin amlodipine 10 mg daily, but start out 1/2 tablet daily for the first 5 days  After 5 days increase to the full tablet   Plan to recheck in 1 month on blood pressure and gout  You may send in your FMLA paperwork

## 2018-01-15 ENCOUNTER — Telehealth: Payer: Self-pay

## 2018-01-15 LAB — URIC ACID: Uric Acid: 8.6 mg/dL (ref 3.7–8.6)

## 2018-01-15 NOTE — Telephone Encounter (Signed)
Could not lvm. [facility]

## 2018-02-20 ENCOUNTER — Telehealth: Payer: Self-pay | Admitting: Cardiology

## 2018-02-20 NOTE — Telephone Encounter (Signed)
New Message   Pt c/o of Chest Pain: STAT if CP now or developed within 24 hours  1. Are you having CP right now? No, spoke with spouse Terri who advise that the patient has been experiencing on and off chest discomfort  2. Are you experiencing any other symptoms (ex. SOB, nausea, vomiting, sweating)? No complaints of symptoms   3. How long have you been experiencing CP? On and off for a little more than two weeks   4. Is your CP continuous or coming and going? Coming and going   5. Have you taken Nitroglycerin? Not to his spouse knowledge    Spoke with patients wife Camelia Eng who was not with the patient at the time of call. 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 I did schedule an appt with Theodore Demark for Friday.

## 2018-02-20 NOTE — Telephone Encounter (Signed)
He can come to [place] to see me today if he wants.  Add on anytime in the afternoon.

## 2018-02-20 NOTE — Telephone Encounter (Signed)
Spoke with patient of Dr. Antoine Poche who reports chest pain off and on for 2+ weeks. 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 He described the pain as pressure under his "chest line" and under arm and to his back sometimes but it can also be a sharp, stabbing pain. He has occasional SOB and is more fatigued that usual. He reports he is not taking losartan but resumed amlodipine and this has helped his symptoms. He does not routinely check BP at home. He has a PA visit scheduled for 1/31 - offered same day visit today @ 1130 with NP but he declined.   Routed to MD as Lorain Childes and/or any recommendations prior to OV.

## 2018-02-20 NOTE — Telephone Encounter (Signed)
LM for patient to return call.

## 2018-02-22 ENCOUNTER — Encounter: Payer: Self-pay | Admitting: Physician Assistant

## 2018-02-22 ENCOUNTER — Ambulatory Visit: Payer: 59 | Admitting: Physician Assistant

## 2018-02-22 VITALS — BP 140/84 | HR 82 | Ht 71.0 in | Wt 290.6 lb

## 2018-02-22 DIAGNOSIS — R002 Palpitations: Secondary | ICD-10-CM

## 2018-02-22 DIAGNOSIS — R5383 Other fatigue: Secondary | ICD-10-CM

## 2018-02-22 DIAGNOSIS — R079 Chest pain, unspecified: Secondary | ICD-10-CM

## 2018-02-22 DIAGNOSIS — I10 Essential (primary) hypertension: Secondary | ICD-10-CM | POA: Diagnosis not present

## 2018-02-22 DIAGNOSIS — Z79899 Other long term (current) drug therapy: Secondary | ICD-10-CM | POA: Diagnosis not present

## 2018-02-22 MED ORDER — DICLOFENAC SODIUM 1 % TD GEL: 2.0000 g | Freq: Two times a day (BID) | TRANSDERMAL | 0 refills | Status: DC | PRN

## 2018-02-22 MED FILL — DICLOFENAC SODIUM 1 % GEL: 1 | 25 days supply | Qty: 100 | Fill #0

## 2018-02-22 NOTE — Telephone Encounter (Signed)
Pt seeing Bjorn Loser today ./cy

## 2018-02-22 NOTE — Progress Notes (Signed)
Cardiology Office Note   Date:  02/22/2018   ID:  Victor Jensen, DOB [birth year], MRN [id number]  PCP:  Jac Canavanysinger, David S, PA-C Cardiologist:  Rollene RotundaJames Hochrein, MD 09/13/2017 Theodore Demarkhonda Barrett, PA-C   No chief complaint on file.   History of Present Illness: Victor MundaJonathan D Soderlund is a 33 y.o. male with a history of obesity, tob use, FH CAD, HTN, abnl  ECG, MRI 2017 w/ EF 56% w/ concentric LVH, HTN BP response w/ dobut echo.   08/22 office visit, on HCTZ for LE edema, works nights, exercises, wt 280, repeat echo (improved) 01/29 phone notes regarding chest pain, appt made  Victor Jensen presents for cardiology follow up.  He is tired all the time. Because he works nights he has never had a normal sleep schedule. However, he has been very fatigued lately. More than usual in the last 6 weeks or so.   He feels his heart fluttering on a daily basis. He has had this for a long time, however, was brief and not that frequent. That has also worsened significantly in the last month or 2. No drug use, no significant caffeine.  No illnesses, no OTC meds.  He gets chest pain L lower ribs on L, under arm and in back. Jabbing, up to 8-9/10. Lasts a couple of minutes at most. Happens with and without exertion (at work). Does not get when exercising.  It has never lasted long enough that he took anything for it.  It takes his breath.  He does not get diaphoretic, no nausea or vomiting.  He exercises regularly, does "Hit-cardio" but does not tolerate it well any more. Feels exhausted afterward. No chest pain or heart fluttering with exercise.  As long as he does not do the hit-cardio, he feels the best when he is exercising.   Past Medical History:  Diagnosis Date  . Allergic rhinitis   . Allergy   . Family history of premature CAD    father, died MI in early 4640s  . H/O cardiovascular stress test 01/2015   LVH, EF 65-70%, but other imaging advised, Dr. Antoine PocheHochrein  . History of MRI of chest    cardiac MRI.  Concentric LVH, mildly dilated LV. Dr. Antoine PocheHochrein  . History of sleep disturbance 04/2015   equivocal sleep study 04/2015  . Obesity     Past Surgical History:  Procedure Laterality Date  . NO PAST SURGERIES  06/2015  . None      Current Outpatient Medications  Medication Sig Dispense Refill  . allopurinol (ZYLOPRIM) 300 MG tablet Take 1 tablet (300 mg total) by mouth daily. 90 tablet 3  . amLODipine (NORVASC) 10 MG tablet Take 1 tablet (10 mg total) by mouth daily. 30 tablet 3   No current facility-administered medications for this visit.     Allergies:   Patient has no known allergies.    Social History:  The patient  reports that he has quit smoking. He has a 1.50 pack-year smoking history. He has never used smokeless tobacco. He reports current drug use. Drug: Marijuana. He reports that he does not drink alcohol.   Family History:  The patient's family history includes Cancer in an other family member; Diabetes in his father, maternal grandfather, maternal grandmother, and paternal grandfather; Heart attack (age of onset: 5740) in his father; Heart disease in his maternal grandfather; Heart disease (age of onset: 4242) in his father.  He indicated that his mother is alive. He indicated that his  father is deceased. He indicated that his sister is alive. He indicated that both of his brothers are alive. He indicated that the status of his maternal grandmother is unknown. He indicated that his maternal grandfather is deceased. He indicated that his paternal grandmother is deceased. He indicated that his paternal grandfather is deceased. He indicated that the status of his other is unknown.    ROS:  Please see the history of present illness. All other systems are reviewed and negative.    PHYSICAL EXAM: VS:  BP 140/84   Pulse 82   Ht 5\' 11"  (1.803 m)   Wt 290 lb 9.6 oz (131.8 kg)   SpO2 98%   BMI 40.53 kg/m  , BMI Body mass index is 40.53 kg/m. GEN: Well nourished, well  developed, male in no acute distress HEENT: normal for age  Neck: no JVD, no carotid bruit, no masses Cardiac: RRR; no murmur, no rubs, or gallops Respiratory:  clear to auscultation bilaterally, normal work of breathing; ++ chest wall tenderness in the areas where he describes the pain GI: soft, nontender, nondistended, + BS MS: no deformity or atrophy; no edema; distal pulses are 2+ in all 4 extremities  Skin: warm and dry, no rash Neuro:  Strength and sensation are intact Psych: euthymic mood, full affect   EKG:  EKG is ordered today. The ekg ordered today demonstrates sinus rhythm, heart rate 69, ST and T wave abnormalities as well as Q waves in V1 are old.  ECHO: 09/2017 - Left ventricle: The cavity size was normal. Wall thickness was   increased in a pattern of mild LVH. Systolic function was   vigorous. The estimated ejection fraction was in the range of 65%   to 70%. Wall motion was normal; there were no regional wall   motion abnormalities. There was no evidence of elevated   ventricular filling pressure by Doppler parameters. - Left atrium: The atrium was mildly dilated. - Right ventricle: The cavity size was mildly dilated. Wall   thickness was normal. - Right atrium: The atrium was mildly dilated. - Tricuspid valve: There was trivial regurgitation.  Recent Labs: 05/19/2017: BUN 12; Creatinine, Ser 1.12; Hemoglobin 13.5; Platelets 192; Potassium 3.5; Sodium 138 05/20/2017: ALT 27; B Natriuretic Peptide 31.3  CBC    Component Value Date/Time   WBC 6.5 05/19/2017 2137   RBC 5.09 05/19/2017 2137   HGB 13.5 05/19/2017 2137   HCT 40.4 05/19/2017 2137   PLT 192 05/19/2017 2137   MCV 79.4 05/19/2017 2137   MCV 77.6 (A) 11/14/2014 1357   MCH 26.5 05/19/2017 2137   MCHC 33.4 05/19/2017 2137   RDW 13.2 05/19/2017 2137   CMP Latest Ref Rng & Units 05/20/2017 05/19/2017 07/23/2015  Glucose 65 - 99 mg/dL - 94 86  BUN 6 - 20 mg/dL - 12 15  Creatinine 1.610.61 - 1.24 mg/dL - 0.961.12  0.451.21  Sodium 409135 - 145 mmol/L - 138 138  Potassium 3.5 - 5.1 mmol/L - 3.5 3.9  Chloride 101 - 111 mmol/L - 103 100  CO2 22 - 32 mmol/L - 26 25  Calcium 8.9 - 10.3 mg/dL - 9.1 9.7  Total Protein 6.5 - 8.1 g/dL 7.3 - 7.6  Total Bilirubin 0.3 - 1.2 mg/dL 1.1 - 0.7  Alkaline Phos 38 - 126 U/L 54 - 60  AST 15 - 41 U/L 37 - 32  ALT 17 - 63 U/L 27 - 23     Lipid Panel Lab Results  Component Value  Date   CHOL 186 07/10/2017   HDL 51 07/10/2017   LDLCALC 117 (H) 07/10/2017   TRIG 91 07/10/2017   CHOLHDL 3.6 07/10/2017      Wt Readings from Last 3 Encounters:  02/22/18 290 lb 9.6 oz (131.8 kg)  01/14/18 288 lb (130.6 kg)  11/09/17 285 lb 9.6 oz (129.5 kg)     Other studies Reviewed: Additional studies/ records that were reviewed today include: Office notes and testing.  ASSESSMENT AND PLAN:  1.  Chest pain: Symptoms are atypical.  They occur at times at home when he is resting, but he mostly gets them when he was working.  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 - We will try Voltaren gel, apply twice a day to see if this helps his symptoms. - Because of his family history of premature coronary artery disease, he may feel more comfortable if he gets a stress test.  Advised that we can do a POET or stress echo for further evaluation but at this time, I do not feel it is needed.  2.  Hypertension he states he is compliant with his medications, his blood pressure is elevated today because he has not yet had his meds.  3.  Fatigue: We will check a TSH and electrolytes.  He has very poor sleep hygiene, states that he generally wakes up after being asleep for about an hour and a half.  He does this several times so cumulatively, he gets 6 or more hours of sleep daily. - Have suggested he try melatonin, up to 10 mg to see if this helps him  sleep longer and more deeply.  4.  Palpitations: He states they happen on a daily basis, will get a Holter monitor.  If he is having significant PVCs (none felt on physical exam and none on the ECG), that could contribute to his fatigue as well. -Check a Holter monitor, no med changes at this time - Check electrolytes including potassium and magnesium    Current medicines are reviewed at length with the patient today.  The patient does not have concerns regarding medicines.  The following changes have been made: Try Voltaren for the chest pain, add OTC melatonin and possibly magnesium (as needed for cramping)  Labs/ tests ordered today include:  No orders of the defined types were placed in this encounter.   Disposition:   FU with Rollene Rotunda, MD  Signed, Theodore Demark, PA-C  02/22/2018 8:46 AM    [facility] Phone: [phone]; Fax: [phone]

## 2018-02-22 NOTE — Telephone Encounter (Signed)
Pt saw Theodore Demark to @ 8:30 am

## 2018-02-22 NOTE — Patient Instructions (Addendum)
Medication Instructions:  START VOLTAREN GEL-APPLY TO SORE SPOTS ON RIBS TWICE DAILY AS NEEDED-REFILLS WITH PCP ONLY  TAKE OVER THE COUNTER MAGNESIUM  TRY OVER THE COUNTER MELATONIN MAY TAKE UP TO 10MG  If you need a refill on your cardiac medications before your next appointment, please call your pharmacy.  Labwork: TSH,MAG AND BMET TODAY HERE IN OUR OFFICE AT [facility]  Take the provided lab slips with you to the lab for your blood draw.   When you have your labs (blood work) drawn today and your tests are completely normal, you will receive your results only by MyChart Message (if you have MyChart) -OR-  A paper copy in the mail.  If you have any lab test that is abnormal or we need to change your treatment, we will call you to review these results.  Special Instructions: PLEASE FOLLOW HEART HEALTHY DIET(ATTACHED)  Follow-Up: You will need a follow up appointment in 6-12 months.  Please call our office 2 months(JULY 2020) in advance to schedule this(AUGUST 2020) appointment.  You may see Rollene Rotunda, MD or one of the following Advanced Practice Providers on your designated Care Team:  Theodore Demark, PA-C  Joni Reining, DNP, AACC   At [facility], you and your health needs are our priority.  As part of our continuing mission to provide you with exceptional heart care, we have created designated Provider Care Teams.  These Care Teams include your primary Cardiologist (physician) and Advanced Practice Providers (APPs -  Physician Assistants and Nurse Practitioners) who all work together to provide you with the care you need, when you need it.  Thank you for choosing [facility]!!      Fat and Cholesterol Restricted Eating Plan Getting too much fat and cholesterol in your diet may cause health problems. Choosing the right foods helps keep your fat and cholesterol at normal levels. This can keep you from getting certain diseases. Your doctor may recommend an eating  plan that includes:  Total fat: ______% or less of total calories a day.  Saturated fat: ______% or less of total calories a day.  Cholesterol: less than _________mg a day.  Fiber: ______g a day. What are tips for following this plan? Meal planning  At meals, divide your plate into four equal parts: ? Fill one-half of your plate with vegetables and green salads. ? Fill one-fourth of your plate with whole grains. ? Fill one-fourth of your plate with low-fat (lean) protein foods.  Eat fish that is high in omega-3 fats at least two times a week. This includes mackerel, tuna, sardines, and salmon.  Eat foods that are high in fiber, such as whole grains, beans, apples, broccoli, carrots, peas, and barley. General tips   Work with your doctor to lose weight if you need to.  Avoid: ? Foods with added sugar. ? Fried foods. ? Foods with partially hydrogenated oils.  Limit alcohol intake to no more than 1 drink a day for nonpregnant women and 2 drinks a day for men. One drink equals 12 oz of beer, 5 oz of wine, or 1 oz of hard liquor. Reading food labels  Check food labels for: ? Trans fats. ? Partially hydrogenated oils. ? Saturated fat (g) in each serving. ? Cholesterol (mg) in each serving. ? Fiber (g) in each serving.  Choose foods with healthy fats, such as: ? Monounsaturated fats. ? Polyunsaturated fats. ? Omega-3 fats.  Choose grain products that have whole grains. Look for the word "whole"  as the first word in the ingredient list. Cooking  Cook foods using low-fat methods. These include baking, boiling, grilling, and broiling.  Eat more home-cooked foods. Eat at restaurants and buffets less often.  Avoid cooking using saturated fats, such as butter, cream, palm oil, palm kernel oil, and coconut oil. Recommended foods  Fruits  All fresh, canned (in natural juice), or frozen fruits. Vegetables  Fresh or frozen vegetables (raw, steamed, roasted, or grilled).  Green salads. Grains  Whole grains, such as whole wheat or whole grain breads, crackers, cereals, and pasta. Unsweetened oatmeal, bulgur, barley, quinoa, or brown rice. Corn or whole wheat flour tortillas. Meats and other protein foods  Ground beef (85% or leaner), grass-fed beef, or beef trimmed of fat. Skinless chicken or [place]. Ground chicken or [place]. Pork trimmed of fat. All fish and seafood. Egg whites. Dried beans, peas, or lentils. Unsalted nuts or seeds. Unsalted canned beans. Nut butters without added sugar or oil. Dairy  Low-fat or nonfat dairy products, such as skim or 1% milk, 2% or reduced-fat cheeses, low-fat and fat-free ricotta or cottage cheese, or plain low-fat and nonfat yogurt. Fats and oils  Tub margarine without trans fats. Light or reduced-fat mayonnaise and salad dressings. Avocado. Olive, canola, sesame, or safflower oils. The items listed above may not be a complete list of foods and beverages you can eat. Contact a dietitian for more information. Foods to avoid Fruits  Canned fruit in heavy syrup. Fruit in cream or butter sauce. Fried fruit. Vegetables  Vegetables cooked in cheese, cream, or butter sauce. Fried vegetables. Grains  White bread. White pasta. White rice. Cornbread. Bagels, pastries, and croissants. Crackers and snack foods that contain trans fat and hydrogenated oils. Meats and other protein foods  Fatty cuts of meat. Ribs, chicken wings, bacon, sausage, bologna, salami, chitterlings, fatback, hot dogs, bratwurst, and packaged lunch meats. Liver and organ meats. Whole eggs and egg yolks. Chicken and [place] with skin. Fried meat. Dairy  Whole or 2% milk, cream, half-and-half, and cream cheese. Whole milk cheeses. Whole-fat or sweetened yogurt. Full-fat cheeses. Nondairy creamers and whipped toppings. Processed cheese, cheese spreads, and cheese curds. Beverages  Alcohol. Sugar-sweetened drinks such as sodas, lemonade, and fruit drinks. Fats  and oils  Butter, stick margarine, lard, shortening, ghee, or bacon fat. Coconut, palm kernel, and palm oils. Sweets and desserts  Corn syrup, sugars, honey, and molasses. Candy. Jam and jelly. Syrup. Sweetened cereals. Cookies, pies, cakes, donuts, muffins, and ice cream. The items listed above may not be a complete list of foods and beverages you should avoid. Contact a dietitian for more information. Summary  Choosing the right foods helps keep your fat and cholesterol at normal levels. This can keep you from getting certain diseases.  At meals, fill one-half of your plate with vegetables and green salads.  Eat high-fiber foods, like whole grains, beans, apples, carrots, peas, and barley.  Limit added sugar, saturated fats, alcohol, and fried foods. This information is not intended to replace advice given to you by your health care provider. Make sure you discuss any questions you have with your health care provider. Document Released: 07/11/2011 Document Revised: 09/12/2017 Document Reviewed: 09/26/2016 Elsevier Interactive Patient Education  2019 [employer].

## 2018-02-23 LAB — BASIC METABOLIC PANEL
BUN / CREAT RATIO: 11 (ref 9–20)
BUN: 15 mg/dL (ref 6–20)
CHLORIDE: 102 mmol/L (ref 96–106)
CO2: 22 mmol/L (ref 20–29)
Calcium: 9.4 mg/dL (ref 8.7–10.2)
Creatinine, Ser: 1.39 mg/dL — ABNORMAL HIGH (ref 0.76–1.27)
GFR calc Af Amer: 75 mL/min/{1.73_m2} (ref >59)
GFR, EST NON AFRICAN AMERICAN: 65 mL/min/{1.73_m2} (ref >59)
Glucose: 86 mg/dL (ref 65–99)
POTASSIUM: 4.3 mmol/L (ref 3.5–5.2)
Sodium: 141 mmol/L (ref 134–144)

## 2018-02-23 LAB — MAGNESIUM: Magnesium: 1.9 mg/dL (ref 1.6–2.3)

## 2018-02-23 LAB — TSH: TSH: 0.871 u[IU]/mL (ref 0.450–4.500)

## 2018-03-11 ENCOUNTER — Telehealth: Payer: Self-pay | Admitting: Family Medicine

## 2018-03-11 NOTE — Telephone Encounter (Signed)
Pt called and his Allpourinal and Diclofenac is not helping what he thinks is gout. He has taken 800 mg ibuprofen at 2 am this morning.  Can he now take a Hydrocodone 7.5/325? Please advise pt [phone]  This is Shane's pt, Vincenza Hews is out of office without access.  Pt has appointment in the morning to see [facility]. Patient states he turned his ankle, same foot at work, so I explained the [facility] info to him.

## 2018-03-11 NOTE — Telephone Encounter (Signed)
Advised pt of same. 

## 2018-03-11 NOTE — Telephone Encounter (Signed)
He can take the codeine as well as the ibuprofen.  Ibuprofen be 3 times per day and a codeine as needed if the ibuprofen does not work.

## 2018-03-12 ENCOUNTER — Ambulatory Visit: Payer: 59 | Admitting: Sports Medicine

## 2018-03-12 ENCOUNTER — Encounter: Payer: Self-pay | Admitting: Sports Medicine

## 2018-03-12 ENCOUNTER — Other Ambulatory Visit: Payer: Self-pay | Admitting: Sports Medicine

## 2018-03-12 ENCOUNTER — Ambulatory Visit: Payer: 59 | Admitting: Medical

## 2018-03-12 ENCOUNTER — Ambulatory Visit (INDEPENDENT_AMBULATORY_CARE_PROVIDER_SITE_OTHER): Payer: 59

## 2018-03-12 ENCOUNTER — Encounter: Payer: Self-pay | Admitting: Medical

## 2018-03-12 VITALS — BP 140/92 | HR 81 | Temp 98.0°F | Resp 16 | Ht 71.0 in | Wt 294.2 lb

## 2018-03-12 DIAGNOSIS — M79671 Pain in right foot: Secondary | ICD-10-CM | POA: Diagnosis not present

## 2018-03-12 DIAGNOSIS — S96911A Strain of unspecified muscle and tendon at ankle and foot level, right foot, initial encounter: Secondary | ICD-10-CM

## 2018-03-12 DIAGNOSIS — M25471 Effusion, right ankle: Secondary | ICD-10-CM | POA: Diagnosis not present

## 2018-03-12 DIAGNOSIS — M25571 Pain in right ankle and joints of right foot: Secondary | ICD-10-CM | POA: Insufficient documentation

## 2018-03-12 MED ORDER — TRAMADOL HCL 50 MG PO TABS: 50.0000 mg | ORAL_TABLET | Freq: Three times a day (TID) | ORAL | 0 refills | Status: DC | PRN

## 2018-03-12 MED FILL — traMADol HCL 50 MG TABS: 50 | 10 days supply | Qty: 30 | Fill #0

## 2018-03-12 NOTE — Progress Notes (Signed)
Subjective:  Victor Jensen is a 33 y.o. male patient who presents to office for evaluation of right ankle pain. Patient complains of continued pain in the ankle over the last 6-day. Patient has tried Motrin and use of crutches with no relief in symptoms. Patient denies any other pedal complaints.  Admits that pain started after twisting his foot while working out in the gym states that it that he had soreness and by the second day could barely walk on his foot states that he initially thought it was gout so he started on his gout medication but the pain continued.  Patient denies nausea vomiting fever chills but states that the pain was excruciating over the last 2 days crutches could not put pressure on his foot.  Review of Systems  Musculoskeletal: Positive for joint pain and myalgias.  All other systems reviewed and are negative.    Patient Active Problem List   Diagnosis Date Noted  . Right foot pain 03/12/2018  . Acute right ankle pain 03/12/2018  . Left foot pain 01/14/2018  . Vaccine counseling 07/10/2017  . Plantar fasciitis 07/10/2017  . Acne 07/10/2017  . Essential hypertension, benign 05/29/2017  . Encounter for health maintenance examination in adult 07/23/2015  . 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 Impaired fasting blood sugar 07/23/2015  . Obesity 07/23/2015  . LVH (left ventricular hypertrophy) 07/23/2015  . Family history of premature CAD 07/23/2015  . Family history of diabetes mellitus 07/23/2015    Current Outpatient Medications on File Prior to Visit  Medication Sig Dispense Refill  . allopurinol (ZYLOPRIM) 300 MG tablet Take 1 tablet (300 mg total) by mouth daily. 90 tablet 3  . amLODipine (NORVASC) 10 MG tablet Take 1 tablet (10 mg total) by mouth daily. 30 tablet 3  . diclofenac sodium (VOLTAREN) 1 % GEL Apply 2 g topically 2 (two) times daily as needed (apply to SORE SPOTS ON RIBS). Refills with PCP ONLY 100 g 0  . ibuprofen (ADVIL,MOTRIN) 800 MG  tablet Take 800 mg by mouth every 8 (eight) hours as needed.     No current facility-administered medications on file prior to visit.     No Known Allergies  Objective:  General: Alert and oriented x3 in no acute distress  Dermatology: No open lesions bilateral lower extremities, no webspace macerations, no ecchymosis bilateral, all nails x 10 are well manicured.  Vascular: Dorsalis Pedis and Posterior Tibial pedal pulses palpable, Capillary Fill Time 3 seconds,(+) pedal hair growth bilateral, no edema bilateral lower extremities, Temperature gradient within normal limits.  Neurology: Gross sensation intact via light touch bilateral, negative Tinel's sign on right.  Musculoskeletal: Moderate tenderness with palpation at medial ankle pain and guarding with range of motion of right ankle.  Pain and spasm along the Achilles on right. Strength unable to be assessed due to pain.  Gait: Antalgic with use of crutches  Xrays  R Ankle   Impression:swelling right ankle medial aspect, no fracture or dislocation, guarding with spasming patient unable to risk foot down completely x-ray.  Assessment and Plan: Problem List Items Addressed This Visit    None    Visit Diagnoses    Tear of tendon of right ankle, initial encounter    -  Primary   Relevant Medications   traMADol (ULTRAM) 50 MG tablet   Other Relevant Orders   DME Crutches   Pain and swelling of right ankle           -Complete examination  performed -Xrays reviewed -Discussed treatement options for likely tear of ankle tendon with guarding and spasm due to pain -Rx MRI to rule out tendon -Applied for patient to keep intact for at least 5 days or until time for MRI -Prescription given for crutches and for tramadol patient to remain nonweightbearing until after MRI -May take Motrin as needed in between doses of pain medicine -Patient to return to office in 1 week for reevaluation and possible discussion of MRI results or sooner  if condition worsens.  [name], DPM

## 2018-03-12 NOTE — Progress Notes (Signed)
Subjective:     Patient ID: Victor Jensen, male   DOB: 1986-01-20, 33 y.o.   MRN: [id number]  HPI Here for  Chief Complaint  Patient presents with  . right foot pain    ? gout, right foot pain X  thursday rolled foot    Here for right foot pain.    Last week was in a different pair of shoes than normal while exercising.  This pair of shoes had hard soles.  He was doing a new exercise routine.  Was jumping side to side, rolled right foot.   Turned right foot outwards.  He did fall but he denied hearing a pop.  He continued to exercise and didn't think so much about it.   But next day had worse foot pain.   Within 3 days, worse pain in general.   Has had swelling.   Pain is worse in medial right foot.  Over the last few days has not been able to bear weight at all on the right foot.  Using crutches.  The ankle continues to be swollen but no obvious bruising.  He is using diclofenac tablets.  He has not used ice.  He is tried to stay off the foot the last few days.  He has a history of gout, takes allopurinol daily.  Has seen Dr. Charlsie Merles in the past year.  No other aggravating or relieving factors. No other complaint.   Past Medical History:  Diagnosis Date  . Allergic rhinitis   . Allergy   . Family history of premature CAD    father, died MI in early 52s  . H/O cardiovascular stress test 01/2015   LVH, EF 65-70%, but other imaging advised, Dr. Antoine Poche  . History of MRI of chest    cardiac MRI.  Concentric LVH, mildly dilated LV. Dr. Antoine Poche  . History of sleep disturbance 04/2015   equivocal sleep study 04/2015  . Obesity    Current Outpatient Medications on File Prior to Visit  Medication Sig Dispense Refill  . allopurinol (ZYLOPRIM) 300 MG tablet Take 1 tablet (300 mg total) by mouth daily. 90 tablet 3  . amLODipine (NORVASC) 10 MG tablet Take 1 tablet (10 mg total) by mouth daily. 30 tablet 3  . ibuprofen (ADVIL,MOTRIN) 800 MG tablet Take 800 mg by mouth every 8 (eight) hours  as needed.    . diclofenac sodium (VOLTAREN) 1 % GEL Apply 2 g topically 2 (two) times daily as needed (apply to SORE SPOTS ON RIBS). Refills with PCP ONLY (Patient not taking: Reported on 03/12/2018) 100 g 0   No current facility-administered medications on file prior to visit.    ROS as in subjective  Review of Systems     Objective:   Physical Exam BP (!) 140/92   Pulse 81   Temp 98 F (36.7 C) (Oral)   Resp 16   Ht 5\' 11"  (1.803 m)   Wt 294 lb 3.2 oz (133.4 kg)   SpO2 97%   BMI 41.03 kg/m   Gen: wd, wn, nad Skin unremarkable MSK: There is obvious swelling of the right foot deltoid ligament area and medial malleolus compared to the left foot, he is quite tender in the same area of the medial right foot, medial malleolus and deltoid ligament.  Range of motion is significantly decreased in all direction of the right ankle, unable to fully appreciate the exam given his pain.  No obvious bruising.  Rest of foot nontender.  Otherwise  legs unremarkable Pulses 2+ pedal pulses, cap refill normal Neuro: normal sensation, can't fully test strength given the injury     Assessment:     Encounter Diagnoses  Name Primary?  . Right foot pain Yes  . Acute right ankle pain        Plan:     Given his pain, limited range of motion, unable to bear weight, swelling, he needs to have an x-ray.  He has a podiatrist and just saw them in the last 4 months.  We will try to work him in with them today for x-ray and further evaluation.  Geffery was seen today for right foot pain.  Diagnoses and all orders for this visit:  Right foot pain  Acute right ankle pain

## 2018-03-13 ENCOUNTER — Telehealth: Payer: Self-pay | Admitting: Medical

## 2018-03-13 ENCOUNTER — Telehealth: Payer: Self-pay | Admitting: *Deleted

## 2018-03-13 DIAGNOSIS — S96911A Strain of unspecified muscle and tendon at ankle and foot level, right foot, initial encounter: Secondary | ICD-10-CM

## 2018-03-13 DIAGNOSIS — M25471 Effusion, right ankle: Secondary | ICD-10-CM

## 2018-03-13 DIAGNOSIS — M25571 Pain in right ankle and joints of right foot: Secondary | ICD-10-CM

## 2018-03-13 NOTE — Telephone Encounter (Signed)
Orders given to Tomasa Hose, RN and faxed orders to [facility].

## 2018-03-13 NOTE — Telephone Encounter (Signed)
I saw him yesterday for foot injury and referred to podiatry.   I received FMLA today.   Podiatry should have a better idea how long he needs to be out of work.   Please send FMLA to podiatry.

## 2018-03-13 NOTE — Telephone Encounter (Signed)
-----   Message from [place], [place] sent at 03/12/2018  9:22 PM EST ----- Regarding: MRI R Ankle Pain and swelling after injury at medial ankle eval PT vs Deltoid tendon tear -Dr Kathie Rhodes

## 2018-03-14 NOTE — Telephone Encounter (Signed)
I have mailed the FMLA forms to [facility].

## 2018-03-15 ENCOUNTER — Ambulatory Visit (HOSPITAL_COMMUNITY)
Admission: RE | Admit: 2018-03-15 | Discharge: 2018-03-15 | Disposition: A | Discharge status: Home / Self Care | Payer: 59 | Source: Ambulatory Visit | Attending: Sports Medicine | Admitting: Sports Medicine

## 2018-03-15 DIAGNOSIS — S96911A Strain of unspecified muscle and tendon at ankle and foot level, right foot, initial encounter: Secondary | ICD-10-CM | POA: Insufficient documentation

## 2018-03-15 DIAGNOSIS — M25571 Pain in right ankle and joints of right foot: Secondary | ICD-10-CM | POA: Diagnosis not present

## 2018-03-15 DIAGNOSIS — M65871 Other synovitis and tenosynovitis, right ankle and foot: Secondary | ICD-10-CM | POA: Diagnosis not present

## 2018-03-15 DIAGNOSIS — M25471 Effusion, right ankle: Secondary | ICD-10-CM | POA: Diagnosis not present

## 2018-03-19 ENCOUNTER — Ambulatory Visit: Payer: 59 | Admitting: Sports Medicine

## 2018-03-19 ENCOUNTER — Encounter: Payer: Self-pay | Admitting: Sports Medicine

## 2018-03-19 DIAGNOSIS — M25571 Pain in right ankle and joints of right foot: Secondary | ICD-10-CM | POA: Diagnosis not present

## 2018-03-19 DIAGNOSIS — M659 Synovitis and tenosynovitis, unspecified: Secondary | ICD-10-CM | POA: Diagnosis not present

## 2018-03-19 DIAGNOSIS — S93401A Sprain of unspecified ligament of right ankle, initial encounter: Secondary | ICD-10-CM | POA: Diagnosis not present

## 2018-03-19 DIAGNOSIS — M79676 Pain in unspecified toe(s): Secondary | ICD-10-CM

## 2018-03-19 DIAGNOSIS — M25471 Effusion, right ankle: Secondary | ICD-10-CM

## 2018-03-19 DIAGNOSIS — M65979 Unspecified synovitis and tenosynovitis, unspecified ankle and foot: Secondary | ICD-10-CM

## 2018-03-19 MED ORDER — HYDROCODONE-ACETAMINOPHEN 10-325 MG PO TABS: 1.0000 | ORAL_TABLET | Freq: Four times a day (QID) | ORAL | 0 refills | Status: AC | PRN

## 2018-03-19 MED ORDER — METHYLPREDNISOLONE 4 MG PO TBPK: ORAL_TABLET | ORAL | 0 refills | Status: DC

## 2018-03-19 NOTE — Progress Notes (Signed)
Subjective: Victor Jensen is a 33 y.o. male patient who presents to office for evaluation of right ankle pain. Patient reports that swelling is better but there is still pain; reports that the compression sleeve cut off circulation and reports that the weight of the boot sometimes causes discomfort event with crutches. Admits ocassional shooting pain.  Patient had MRI done and is here for results. No other issues noted.  Patient Active Problem List   Diagnosis Date Noted  . Right foot pain 03/12/2018  . Acute right ankle pain 03/12/2018  . Left foot pain 01/14/2018  . Vaccine counseling 07/10/2017  . Plantar fasciitis 07/10/2017  . Acne 07/10/2017  . Essential hypertension, benign 05/29/2017  . Encounter for health maintenance examination in adult 07/23/2015  . 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 Impaired fasting blood sugar 07/23/2015  . Obesity 07/23/2015  . LVH (left ventricular hypertrophy) 07/23/2015  . Family history of premature CAD 07/23/2015  . Family history of diabetes mellitus 07/23/2015    Current Outpatient Medications on File Prior to Visit  Medication Sig Dispense Refill  . allopurinol (ZYLOPRIM) 300 MG tablet Take 1 tablet (300 mg total) by mouth daily. 90 tablet 3  . amLODipine (NORVASC) 10 MG tablet Take 1 tablet (10 mg total) by mouth daily. 30 tablet 3  . diclofenac sodium (VOLTAREN) 1 % GEL Apply 2 g topically 2 (two) times daily as needed (apply to SORE SPOTS ON RIBS). Refills with PCP ONLY 100 g 0  . ibuprofen (ADVIL,MOTRIN) 800 MG tablet Take 800 mg by mouth every 8 (eight) hours as needed.    . traMADol (ULTRAM) 50 MG tablet Take 1 tablet (50 mg total) by mouth every 8 (eight) hours as needed. 30 tablet 0   No current facility-administered medications on file prior to visit.     No Known Allergies  Objective:  General: Alert and oriented x3 in no acute distress  Dermatology: No open lesions bilateral lower extremities, no webspace macerations,  no ecchymosis bilateral, all nails x 10 are well manicured.  Vascular: Dorsalis Pedis and Posterior Tibial pedal pulses palpable, Capillary Fill Time 3 seconds,(+) pedal hair growth bilateral, no edema bilateral lower extremities, Temperature gradient within normal limits.  Neurology: Gross sensation intact via light touch bilateral, negative Tinel's sign on right.  Musculoskeletal: Mild to moderate tenderness with palpation at medial ankle pain and guarding with range of motion of right ankle.  Pain and spasm along the Achilles on right that is a little better than last week. Strength unable to be assessed due to pain.  Assessment and Plan: Problem List Items Addressed This Visit    None    Visit Diagnoses    Tenosynovitis of ankle    -  Primary   Relevant Medications   HYDROcodone-acetaminophen (NORCO) 10-325 MG tablet   methylPREDNISolone (MEDROL DOSEPAK) 4 MG TBPK tablet   Sprain of right ankle, unspecified ligament, initial encounter       Pain and swelling of right ankle       Relevant Medications   HYDROcodone-acetaminophen (NORCO) 10-325 MG tablet   methylPREDNISolone (MEDROL DOSEPAK) 4 MG TBPK tablet       -Complete examination performed -MRI results review shows Synovitis with sprain injury no tear and talar dome lesion without instability  -Continue with NWB with crutches -Continue with CAM boot -Rx Norco for severe pain and continue with tramadol for mild pain since can not tolerate with motrin -Rx Medrol dose pak for synovitis -If patient  is doing better next visit will start PT -Continue with no work: Expected return 04-17-18 -Patient to return to office in 2 weeks for re-evaluation or sooner if condition worsens.  [name], DPM

## 2018-03-20 ENCOUNTER — Encounter: Payer: Self-pay | Admitting: Sports Medicine

## 2018-03-20 MED FILL — AMLODIPINE BESYLATE 10 MG T: 10 | 30 days supply | Qty: 30 | Fill #1

## 2018-03-20 MED FILL — HYDROCODON-APAP 10-325: 10-325 | 5 days supply | Qty: 20 | Fill #0

## 2018-03-20 MED FILL — METHYLPREDNISOLONE 4 MG TBP: 4 | 6 days supply | Qty: 21 | Fill #0

## 2018-04-02 ENCOUNTER — Encounter: Payer: Self-pay | Admitting: Sports Medicine

## 2018-04-02 ENCOUNTER — Ambulatory Visit (INDEPENDENT_AMBULATORY_CARE_PROVIDER_SITE_OTHER): Payer: 59

## 2018-04-02 ENCOUNTER — Other Ambulatory Visit: Payer: Self-pay

## 2018-04-02 ENCOUNTER — Ambulatory Visit: Payer: 59 | Admitting: Sports Medicine

## 2018-04-02 DIAGNOSIS — M79671 Pain in right foot: Secondary | ICD-10-CM

## 2018-04-02 DIAGNOSIS — M25571 Pain in right ankle and joints of right foot: Secondary | ICD-10-CM

## 2018-04-02 DIAGNOSIS — M65979 Unspecified synovitis and tenosynovitis, unspecified ankle and foot: Secondary | ICD-10-CM

## 2018-04-02 DIAGNOSIS — R252 Cramp and spasm: Secondary | ICD-10-CM

## 2018-04-02 DIAGNOSIS — M25471 Effusion, right ankle: Secondary | ICD-10-CM

## 2018-04-02 DIAGNOSIS — M659 Synovitis and tenosynovitis, unspecified: Secondary | ICD-10-CM

## 2018-04-02 DIAGNOSIS — S93401D Sprain of unspecified ligament of right ankle, subsequent encounter: Secondary | ICD-10-CM

## 2018-04-02 MED ORDER — HYDROCODONE-ACETAMINOPHEN 10-325 MG PO TABS: 1.0000 | ORAL_TABLET | Freq: Four times a day (QID) | ORAL | 0 refills | Status: AC | PRN

## 2018-04-02 MED ORDER — CYCLOBENZAPRINE HCL 10 MG PO TABS: 10.0000 mg | ORAL_TABLET | Freq: Three times a day (TID) | ORAL | 0 refills | Status: DC | PRN

## 2018-04-02 MED ORDER — PREDNISONE 10 MG (21) PO TBPK: ORAL_TABLET | ORAL | 0 refills | Status: DC

## 2018-04-02 MED FILL — HYDROCODON-APAP 10-325: 10-325 | 7 days supply | Qty: 28 | Fill #0

## 2018-04-02 MED FILL — predniSONE 10 MG (21) TBPK: 10 | 6 days supply | Qty: 21 | Fill #0

## 2018-04-02 MED FILL — CYCLOBENZAPRINE HCL 10 MG T: 10 | 10 days supply | Qty: 30 | Fill #0

## 2018-04-02 NOTE — Progress Notes (Signed)
Subjective: CASIMIER DUMITRESCU is a 33 y.o. male patient who presents to office for evaluation of right ankle pain. Patient reports that swelling is better and was almost nonexistent when he was on steroids but did have a slip and fall in the shower and the swelling started to come back and was afraid that he reinjured something with his foot since the initial days after the slip and fall had a lot of pain but slowly it is getting better states that he is still unable to put any pressure or weight on the foot he tried to put a little pressure with standing in the shower and that is how he slipped and fell and has realized that he must wait until I tell him to put pressure and not assume because he is feeling good because of the steroids that he can do more. Admits ocassional shooting pain and cramping.  Patient also expressed that his disability paperwork needs to be completed because there is some confusion about his date of return to work even though verbally I told him that he will not go back until the 25th after he can be reassessed on March 24 and this is pending how he is doing with his improvements.  No other issues noted.  Patient Active Problem List   Diagnosis Date Noted  . Right foot pain 03/12/2018  . Acute right ankle pain 03/12/2018  . Left foot pain 01/14/2018  . Vaccine counseling 07/10/2017  . Plantar fasciitis 07/10/2017  . Acne 07/10/2017  . Essential hypertension, benign 05/29/2017  . Encounter for health maintenance examination in adult 07/23/2015  . 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 Impaired fasting blood sugar 07/23/2015  . Obesity 07/23/2015  . LVH (left ventricular hypertrophy) 07/23/2015  . Family history of premature CAD 07/23/2015  . Family history of diabetes mellitus 07/23/2015    Current Outpatient Medications on File Prior to Visit  Medication Sig Dispense Refill  . allopurinol (ZYLOPRIM) 300 MG tablet Take 1 tablet (300 mg total) by mouth daily. 90  tablet 3  . amLODipine (NORVASC) 10 MG tablet Take 1 tablet (10 mg total) by mouth daily. 30 tablet 3  . diclofenac sodium (VOLTAREN) 1 % GEL Apply 2 g topically 2 (two) times daily as needed (apply to SORE SPOTS ON RIBS). Refills with PCP ONLY 100 g 0  . ibuprofen (ADVIL,MOTRIN) 800 MG tablet Take 800 mg by mouth every 8 (eight) hours as needed.    . methylPREDNISolone (MEDROL DOSEPAK) 4 MG TBPK tablet Take as directed 21 tablet 0  . traMADol (ULTRAM) 50 MG tablet Take 1 tablet (50 mg total) by mouth every 8 (eight) hours as needed. 30 tablet 0   No current facility-administered medications on file prior to visit.     No Known Allergies  Objective:  General: Alert and oriented x3 in no acute distress  Dermatology: No open lesions bilateral lower extremities, no webspace macerations, no ecchymosis bilateral, all nails x 10 are well manicured.  Vascular: Dorsalis Pedis and Posterior Tibial pedal pulses palpable, Capillary Fill Time 3 seconds,(+) pedal hair growth bilateral, no edema bilateral lower extremities, Temperature gradient within normal limits.  Neurology: Gross sensation intact via light touch bilateral, negative Tinel's sign on right.  Musculoskeletal: Mild to moderate tenderness with palpation at medial anterior and lateral ankle pain and guarding with range of motion of right ankle.  Pain and spasm along the Achilles on right that subjectively cramps and was doing much better but after  fall did have an increased episode of pain. Strength unable to be assessed due to pain and guarding.  Assessment and Plan: Problem List Items Addressed This Visit    None    Visit Diagnoses    Tenosynovitis of ankle    -  Primary   Relevant Medications   predniSONE (STERAPRED UNI-PAK 21 TAB) 10 MG (21) TBPK tablet   HYDROcodone-acetaminophen (NORCO) 10-325 MG tablet   cyclobenzaprine (FLEXERIL) 10 MG tablet   Sprain of right ankle, unspecified ligament, subsequent encounter       Relevant  Medications   predniSONE (STERAPRED UNI-PAK 21 TAB) 10 MG (21) TBPK tablet   HYDROcodone-acetaminophen (NORCO) 10-325 MG tablet   cyclobenzaprine (FLEXERIL) 10 MG tablet   Pain and swelling of right ankle       Relevant Medications   predniSONE (STERAPRED UNI-PAK 21 TAB) 10 MG (21) TBPK tablet   HYDROcodone-acetaminophen (NORCO) 10-325 MG tablet   cyclobenzaprine (FLEXERIL) 10 MG tablet   Pain in right foot       Relevant Medications   predniSONE (STERAPRED UNI-PAK 21 TAB) 10 MG (21) TBPK tablet   HYDROcodone-acetaminophen (NORCO) 10-325 MG tablet   cyclobenzaprine (FLEXERIL) 10 MG tablet   Other Relevant Orders   DG Foot Complete Right   Cramp in limb       Relevant Medications   cyclobenzaprine (FLEXERIL) 10 MG tablet       -Complete examination performed -New x-rays was performed this visit with no acute findings since patient did have a slip and fall a few days ago in the shower -MRI results review shows Synovitis with sprain injury no tear and talar dome lesion without instability was reviewed again with patient -Continue with NWB with crutches -Continue with CAM boot -Refilled Norco for severe pain and continue with tramadol for mild pain since can not tolerate with motrin -Prescribed Flexeril for cramping and advised patient to refrain from mixing medication -Rx prednisone 10 dose Pak for synovitis -Order placed for physical therapy to assist with decreasing pain and inflammation at ankle joint and then slowly transitioning patient to building strength and progressing to weightbearing as he is making progress however did advise patient this may take several weeks to build up strength and stamina to allow him to walk and feel steady and that the ankle is strong without any fear that he will slip and fall -Continue with no work: Expected return 04-17-18; [employer] disability paperwork completed this visit and fax on patient behalf with confirmation noted -Patient to return to office  in 2 weeks for re-evaluation or sooner if condition worsens.  [name], DPM

## 2018-04-03 ENCOUNTER — Telehealth: Payer: Self-pay | Admitting: *Deleted

## 2018-04-03 DIAGNOSIS — M659 Synovitis and tenosynovitis, unspecified: Secondary | ICD-10-CM

## 2018-04-03 DIAGNOSIS — S93401D Sprain of unspecified ligament of right ankle, subsequent encounter: Secondary | ICD-10-CM

## 2018-04-03 DIAGNOSIS — M25571 Pain in right ankle and joints of right foot: Secondary | ICD-10-CM

## 2018-04-03 DIAGNOSIS — M25471 Effusion, right ankle: Secondary | ICD-10-CM

## 2018-04-03 DIAGNOSIS — M79671 Pain in right foot: Secondary | ICD-10-CM

## 2018-04-03 NOTE — Telephone Encounter (Signed)
Referral, demographics to [facility] PT.

## 2018-04-03 NOTE — Telephone Encounter (Signed)
-----   Message from [name], [place] sent at 04/02/2018  5:57 PM EDT ----- Regarding: PT at [facility] Physical therapy for R ankle R ankle sprain Tenosynovitis on MRI, Stable talar dome lesion and history of old sprain at lateral ankle with no new tear Slowly progress to weightbearing and off crutches as patient tolerates or improves with PT -Dr. Marylene Land

## 2018-04-08 ENCOUNTER — Other Ambulatory Visit: Payer: Self-pay | Admitting: Sports Medicine

## 2018-04-08 DIAGNOSIS — M659 Synovitis and tenosynovitis, unspecified: Secondary | ICD-10-CM

## 2018-04-11 ENCOUNTER — Other Ambulatory Visit: Payer: Self-pay

## 2018-04-11 ENCOUNTER — Ambulatory Visit: Payer: 59 | Attending: Sports Medicine

## 2018-04-11 DIAGNOSIS — R2689 Other abnormalities of gait and mobility: Secondary | ICD-10-CM | POA: Insufficient documentation

## 2018-04-11 DIAGNOSIS — M25571 Pain in right ankle and joints of right foot: Secondary | ICD-10-CM | POA: Diagnosis not present

## 2018-04-11 DIAGNOSIS — M25671 Stiffness of right ankle, not elsewhere classified: Secondary | ICD-10-CM | POA: Diagnosis not present

## 2018-04-11 DIAGNOSIS — M6281 Muscle weakness (generalized): Secondary | ICD-10-CM | POA: Diagnosis not present

## 2018-04-11 NOTE — Patient Instructions (Addendum)
YOUR HOMHEo PmROeGR EAMxercise Program Created by Laurena Bering, PT, DPT Mar 19th, 2020 View videos at [email or web address] Total 3 Page 1 of 1 ANKLE PUMPS - AP Bend your foot up and down at your ankle joint as shown. Video # [id number] Repeat 10 Times Hold 1 Second Complete 1 Set Perform 3 Times a Day ANKLE CIRCLES Move your ankle in a circular pattern one direction for several repetitions and then reverse the direction. Video # [id number] Repeat 10 Times Hold 1 Second Complete 1 Set Perform 3 Times a Day ANKLE ABC's While in a seated position, write out the alphabet in the air with your big toe. Your ankle should be moving as you perform this. Video # [id number] Repeat 10 Times Hold 1 Second Complete 1 Set Perform 3 Times a Day SEATED CALF STRETCH - GASTROCNEMIUS While sitting, use a towel or other strap looped around your foot. Gently pull your ankle back until a stretch is felt along the back of your lower leg. Maintain your target knee straight the entire time. Video # [id number] Repeat 1 Time Hold 30 Seconds Complete 3 Sets Perform 3 Times a Day

## 2018-04-11 NOTE — Therapy (Signed)
[facility] [address] [place], [place], [address] Phone: [phone]   Fax:  [phone]  Physical Therapy Evaluation  Patient Details  Name: Victor Jensen MRN: [id number] Date of Birth: January 31, 1985 Referring Provider (PT): [name]   Encounter Date: 04/11/2018  PT End of Session - 04/11/18 1451    Visit Number  1    Number of Visits  16    Date for PT Re-Evaluation  06/06/18    Authorization Type  [facility]  $20 Copay . No Auth required.     PT Start Time  1412    PT Stop Time  1505    PT Time Calculation (min)  53 min    Activity Tolerance  Patient tolerated treatment well;Patient limited by pain    Behavior During Therapy  [facility] for tasks assessed/performed       Past Medical History:  Diagnosis Date  . Allergic rhinitis   . Allergy   . Family history of premature CAD    father, died MI in early 62s  . H/O cardiovascular stress test 01/2015   LVH, EF 65-70%, but other imaging advised, Dr. Antoine Poche  . History of MRI of chest    cardiac MRI.  Concentric LVH, mildly dilated LV. Dr. Antoine Poche  . History of sleep disturbance 04/2015   equivocal sleep study 04/2015  . Obesity     Past Surgical History:  Procedure Laterality Date  . NO PAST SURGERIES  06/2015  . None      There were no vitals filed for this visit.   Subjective Assessment - 04/11/18 1418    Subjective  Pt reports he sprained his ankle at the gym and has been unable to bear weight through R LE, since. He has been using crutches and saw Dr Marylene Land who referred him to PT to help him to start bearing weight and moving it again to tolerance. Pt complains of severe pain when trying to bear weight, through the posterior heel and along medial ankle.     Pertinent History  HTN, R ankle chronic ATFL tear. MRI indicated Talar dome lesion and posterior tib tenosynovitis.    Limitations  Standing;Walking;House hold activities;Other (comment)    How long can you  sit comfortably?  not limited     How long can you stand comfortably?  with crutches 2 mins     How long can you walk comfortably?  with crutches 0 mins     Diagnostic tests  MRI and X ray. MRI indicated Talar dome lesion and posterior tib tenosynovitis. X ray negative for fracture.     Patient Stated Goals  return to gym, to walk normal, return to work.  Driving a forklift/     Currently in Pain?  Yes    Pain Score  3     Pain Location  Ankle   surrounding heel   Pain Orientation  Right    Pain Descriptors / Indicators  Aching;Sharp    Pain Type  Chronic pain    Pain Onset  More than a month ago    Pain Frequency  Intermittent    Aggravating Factors   weight bearing, standing and walking, dependent positions.     Pain Relieving Factors  elevating, medications,     Effect of Pain on Daily Activities  unable to work, unable to walk without crutches, difficulty and walking    Multiple Pain Sites  Yes    Pain Score  5  Pain Location  Knee   L knee but due to compensation during gait.         [facility] PT Assessment - 04/11/18 0001      Assessment   Medical Diagnosis  R ankle sprain    Referring Provider (PT)  Marylene Land, [place]    Onset Date/Surgical Date  03/09/18    Hand Dominance  Right    Next MD Visit  04/16/18    Prior Therapy  none      Restrictions   Weight Bearing Restrictions  Yes    RLE Weight Bearing  Weight bearing as tolerated    Other Position/Activity Restrictions  Wearing Cam boot on R      Balance Screen   Has the patient fallen in the past 6 months  Yes    How many times?  1    Has the patient had a decrease in activity level because of a fear of falling?   Yes    Is the patient reluctant to leave their home because of a fear of falling?   No      Home Public house manager residence    Living Arrangements  Other (Comment)   family   Available Help at Discharge  Family    Home Access  Level entry    Home Layout  One level    Home  Equipment  Crutches      Prior Function   Level of Independence  Independent    Vocation  Full time employment    English as a second language teacher for [employer]    Leisure  Going to the gym       Cognition   Overall Cognitive Status  Within Functional Limits for tasks assessed      Observation/Other Assessments   Focus on Therapeutic Outcomes (FOTO)   76% limitation       Observation/Other Assessments-Edema    Edema  Figure 8;Circumferential      Circumferential Edema   Circumferential - Right  28 cm   malleolar     Figure 8 Edema   Figure 8 - Right   62 cm       Sensation   Light Touch  Appears Intact      Posture/Postural Control   Posture Comments  Pes planus bilat      ROM / Strength   AROM / PROM / Strength  AROM;PROM;Strength      AROM   AROM Assessment Site  Ankle    Right/Left Ankle  Right    Right Ankle Dorsiflexion  -11   lacking 11 degrees from neutral    Right Ankle Plantar Flexion  30    Right Ankle Inversion  20    Right Ankle Eversion  0      PROM   PROM Assessment Site  --    Right/Left Ankle  --      Strength   Strength Assessment Site  Ankle    Right/Left Ankle  Right    Right Ankle Dorsiflexion  3-/5    Right Ankle Plantar Flexion  2/5    Right Ankle Inversion  2-/5    Right Ankle Eversion  2-/5      Palpation   Palpation comment  palpable tenderness along posterior tib tendon, and achilles insertion.        Ambulation/Gait   Ambulation/Gait  Yes    Ambulation/Gait Assistance  6: Modified independent (Device/Increase time)    Ambulation Distance (  Feet)  200 Feet    Assistive device  Crutches    Gait Pattern  Step-through pattern;Decreased stance time - right;Antalgic   PWB on R LE with bilat UE crutches and CAM boot on R   Ambulation Surface  Level                Objective measurements completed on examination: See above findings.      [facility] Adult PT Treatment/Exercise - 04/11/18 0001      Exercises   Exercises   Ankle      Ankle Exercises: Stretches   Gastroc Stretch  30 seconds;3 reps   HEP     Ankle Exercises: Seated   ABC's  1 rep   HEP   Ankle Circles/Pumps  10 reps;AROM   HEP   Other Seated Ankle Exercises  AROM PF/ DF, INV/ EV x 10    HEP            PT Education - 04/11/18 1527    Education Details  Self care education included education on HEP consisting of gentle AROM ther ex with written handout given for 2-3 times a day. Pt instructed to perform 2-3 times a day to tolerance and ice and elevate 20 mins following. Educated pt on PT POC and goals, and pt agreeable. Discussed pathology and impairments associated to be addressed through PT. Instructed pt to continue to use bilat crutches and CAM boot, but increase WB on R LE to tolerance.      Person(s) Educated  Patient    Methods  Explanation;Demonstration;Tactile cues;Verbal cues;Handout    Comprehension  Verbalized understanding;Verbal cues required       PT Short Term Goals - 04/11/18 1558      PT SHORT TERM GOAL #1   Title  Pt will be I with initial HEP for continued strengthening and mobility in 4 weeks.      Baseline  No HEP     Time  4    Period  Weeks    Status  New    Target Date  05/09/18      PT SHORT TERM GOAL #2   Title  R ankle DF AROM will improve from -11 to 10 degrees in order to stand without pain.     Baseline  DF lacking 11 degrees from neutral    Time  4    Period  Weeks    Status  New    Target Date  05/09/18        PT Long Term Goals - 04/11/18 1601      PT LONG TERM GOAL #1   Title  R ankle PF strength will improve to 3+/5 in order to perform stairs pain free by   .     Baseline  R ankle PF MMT  2/5    Time  8    Period  Weeks    Status  New    Target Date  06/06/18      PT LONG TERM GOAL #2   Title  Pt will walk independently 200 feet without A.D. nor CAM boot with reciprocal gait without antalgia by 06/06/18.     Baseline  bilat crutches and cam boot with antalgia     Time  8     Period  Weeks    Status  New    Target Date  06/06/18      PT LONG TERM GOAL #3   Title  FOTO score will improve from  76% limited to <44% to demo improved function and mobility.     Baseline  FOT 76% limited    Time  8    Period  Weeks    Status  New    Target Date  06/06/18             Plan - 04/11/18 1540    Clinical Impression Statement  Pt is a 33 y.o male who was referred to PT by podiatry following ankle sprain. MRI revealed lateral talar dome lesion and posterior tib tenosynovitis. X-ray ruled out fracture. Pt has a history of chronic R ATFL tear. Pt presents with impairments including R ankle pain, impaired R ankle mobility/ROM, and impaired R ankle strength, which limit pt's functional abilities with walking, standing, stairs limiting home and community access. Pt is also not able to work, as a result.  Pt will benefit from oupt PT for 2 times a week for 8 weeks in order to address these impairments and functional limitations and return pt to pain-free PLOF.     Personal Factors and Comorbidities  Comorbidity 1;Past/Current Experience    Comorbidities  weight    Examination-Activity Limitations  Bathing;Lift;Stairs;Squat;Stand;Locomotion Level;Transfers;Carry;Sleep    Examination-Participation Restrictions  Yard Work;Community Activity;Driving    Stability/Clinical Decision Making  Evolving/Moderate complexity    Clinical Decision Making  Low    Rehab Potential  Good    PT Frequency  2x / week    PT Duration  8 weeks    PT Treatment/Interventions  ADLs/Self Care Home Management;Cryotherapy;Ultrasound;Moist Heat;Iontophoresis /ml Dexamethasone;Electrical Stimulation;DME Instruction;Gait training;Stair training;Functional mobility training;Manual techniques;Neuromuscular re-education;Balance training;Therapeutic exercise;Therapeutic activities;Patient/family education;Orthotic Fit/Training;Passive range of motion;Taping    PT Next Visit Plan  Review Ankle HEP. Progress ROM  to tolerance and weight bearing.     PT Home Exercise Plan  Ankle AROM: circles, alphabet, DF/PF, gastroc stretch.     Consulted and Agree with Plan of Care  Patient       Patient will benefit from skilled therapeutic intervention in order to improve the following deficits and impairments:  Abnormal gait, Pain, Increased muscle spasms, Decreased mobility, Decreased activity tolerance, Decreased endurance, Decreased range of motion, Decreased strength, Difficulty walking, Impaired flexibility, Increased edema, Decreased balance  Visit Diagnosis: Other abnormalities of gait and mobility  Muscle weakness (generalized)  Pain in right ankle and joints of right foot  Stiffness of right ankle, not elsewhere classified     Problem List Patient Active Problem List   Diagnosis Date Noted  . Right foot pain 03/12/2018  . Acute right ankle pain 03/12/2018  . Left foot pain 01/14/2018  . Vaccine counseling 07/10/2017  . Plantar fasciitis 07/10/2017  . Acne 07/10/2017  . Essential hypertension, benign 05/29/2017  . Encounter for health maintenance examination in adult 07/23/2015  . 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 Impaired fasting blood sugar 07/23/2015  . Obesity 07/23/2015  . LVH (left ventricular hypertrophy) 07/23/2015  . Family history of premature CAD 07/23/2015  . Family history of diabetes mellitus 07/23/2015    Minerva Areola, PT, DPT 04/11/2018, 4:13 PM  [facility] [address] [place], [place], [address] Phone: [phone]   Fax:  [phone]  Name: KHI MCMILLEN MRN: [id number] Date of Birth: 31-May-1985

## 2018-04-16 ENCOUNTER — Ambulatory Visit: Payer: 59 | Admitting: Sports Medicine

## 2018-04-16 ENCOUNTER — Encounter: Payer: Self-pay | Admitting: Sports Medicine

## 2018-04-16 ENCOUNTER — Other Ambulatory Visit: Payer: Self-pay

## 2018-04-16 DIAGNOSIS — M659 Synovitis and tenosynovitis, unspecified: Secondary | ICD-10-CM

## 2018-04-16 DIAGNOSIS — M25571 Pain in right ankle and joints of right foot: Secondary | ICD-10-CM

## 2018-04-16 DIAGNOSIS — S93401D Sprain of unspecified ligament of right ankle, subsequent encounter: Secondary | ICD-10-CM

## 2018-04-16 DIAGNOSIS — M79671 Pain in right foot: Secondary | ICD-10-CM

## 2018-04-16 DIAGNOSIS — M25471 Effusion, right ankle: Secondary | ICD-10-CM

## 2018-04-16 MED ORDER — TRIAMCINOLONE ACETONIDE 10 MG/ML IJ SUSP
10.0000 mg | Freq: Once | INTRAMUSCULAR | Status: AC
  Administered 2018-04-16: 10 mg

## 2018-04-16 NOTE — Progress Notes (Signed)
Subjective: Victor Jensen is a 33 y.o. male patient who presents to office for evaluation of right ankle pain. Patient reports that swelling is back after finishing steroid and that he is hurting now more on the lateral side of his ankle with stiffness developing again. Tried walking with his boot and had a lot of pain so returned back to crutches.  No other issues noted.  Patient Active Problem List   Diagnosis Date Noted  . Right foot pain 03/12/2018  . Acute right ankle pain 03/12/2018  . Left foot pain 01/14/2018  . Vaccine counseling 07/10/2017  . Plantar fasciitis 07/10/2017  . Acne 07/10/2017  . Essential hypertension, benign 05/29/2017  . Encounter for health maintenance examination in adult 07/23/2015  . 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 Impaired fasting blood sugar 07/23/2015  . Obesity 07/23/2015  . LVH (left ventricular hypertrophy) 07/23/2015  . Family history of premature CAD 07/23/2015  . Family history of diabetes mellitus 07/23/2015    Current Outpatient Medications on File Prior to Visit  Medication Sig Dispense Refill  . allopurinol (ZYLOPRIM) 300 MG tablet Take 1 tablet (300 mg total) by mouth daily. 90 tablet 3  . amLODipine (NORVASC) 10 MG tablet Take 1 tablet (10 mg total) by mouth daily. 30 tablet 3  . cyclobenzaprine (FLEXERIL) 10 MG tablet Take 1 tablet (10 mg total) by mouth 3 (three) times daily as needed for muscle spasms. 30 tablet 0  . diclofenac sodium (VOLTAREN) 1 % GEL Apply 2 g topically 2 (two) times daily as needed (apply to SORE SPOTS ON RIBS). Refills with PCP ONLY 100 g 0  . ibuprofen (ADVIL,MOTRIN) 800 MG tablet Take 800 mg by mouth every 8 (eight) hours as needed.    . methylPREDNISolone (MEDROL DOSEPAK) 4 MG TBPK tablet Take as directed 21 tablet 0  . predniSONE (STERAPRED UNI-PAK 21 TAB) 10 MG (21) TBPK tablet Take as directed 21 tablet 0  . traMADol (ULTRAM) 50 MG tablet Take 1 tablet (50 mg total) by mouth every 8 (eight) hours  as needed. 30 tablet 0   No current facility-administered medications on file prior to visit.     No Known Allergies  Objective:  General: Alert and oriented x3 in no acute distress  Dermatology: No open lesions bilateral lower extremities, no webspace macerations, no ecchymosis bilateral, all nails x 10 are well manicured.  Vascular: Dorsalis Pedis and Posterior Tibial pedal pulses palpable, Capillary Fill Time 3 seconds,(+) pedal hair growth bilateral, no edema bilateral lower extremities, Temperature gradient within normal limits.  Neurology: Gross sensation intact via light touch bilateral, negative Tinel's sign on right.  Musculoskeletal: Mild to moderate tenderness with palpation at medial anterior and lateral ankle pain and guarding with range of motion of right ankle, today worse on right lateral ankle today.  Pain and spasm along the Achilles on right that subjectively cramps and was doing much better but after fall did have an increased episode of pain. Strength unable to be assessed due to pain and guarding.  Assessment and Plan: Problem List Items Addressed This Visit    None    Visit Diagnoses    Tenosynovitis of ankle    -  Primary   Relevant Medications   triamcinolone acetonide (KENALOG) 10 MG/ML injection 10 mg (Completed) (Start on 04/16/2018  5:00 PM)   Sprain of right ankle, unspecified ligament, subsequent encounter       Pain and swelling of right ankle  Pain in right foot          -Complete examination performed -After oral consent and aseptic prep, injected a mixture containing 1 ml of 2%  plain lidocaine, 1 ml 0.5% plain marcaine, 0.5 ml of kenalog 10 and 0.5 ml of dexamethasone phosphate into at right medial and lateral ankle without complication. Post-injection care discussed with patient.  -Applied Below knee fiberglass cast tor right foot and ankle to keep clean dry and intact with prominent areas well padded  -Continue with NWB with  crutches -Continue with Norco for severe pain and continue with tramadol for mild pain since can not tolerate with motrin -Continue with Flexeril for cramping and advised patient to refrain from mixing medication -Resume PT in 2 weeks  -Continue with no work: Expected return 05-29-18; [employer] disability paperwork completed this visit and fax on patient behalf  -Patient to return to office in 1 week for follow up re-evaluation or sooner if condition worsens.  Victor Jensen, DPM

## 2018-04-18 MED FILL — AMLODIPINE BESYLATE 10 MG T: 10 | 30 days supply | Qty: 30 | Fill #2

## 2018-04-23 ENCOUNTER — Encounter: Payer: Self-pay | Admitting: Sports Medicine

## 2018-04-23 ENCOUNTER — Other Ambulatory Visit: Payer: Self-pay

## 2018-04-23 ENCOUNTER — Ambulatory Visit: Payer: 59 | Admitting: Sports Medicine

## 2018-04-23 VITALS — Temp 99.1°F

## 2018-04-23 DIAGNOSIS — S93401D Sprain of unspecified ligament of right ankle, subsequent encounter: Secondary | ICD-10-CM | POA: Diagnosis not present

## 2018-04-23 DIAGNOSIS — M25571 Pain in right ankle and joints of right foot: Secondary | ICD-10-CM

## 2018-04-23 DIAGNOSIS — M659 Synovitis and tenosynovitis, unspecified: Secondary | ICD-10-CM | POA: Diagnosis not present

## 2018-04-23 DIAGNOSIS — M25471 Effusion, right ankle: Secondary | ICD-10-CM

## 2018-04-23 DIAGNOSIS — M79671 Pain in right foot: Secondary | ICD-10-CM | POA: Diagnosis not present

## 2018-04-23 NOTE — Progress Notes (Signed)
Subjective: Victor Jensen is a 33 y.o. male patient who presents to office for evaluation of right ankle pain. Patient reports that cast seems to be helping with occassional cramps. Denies nausea, vomiting, fever, chills or consitutional symptoms  No other issues noted.  Patient Active Problem List   Diagnosis Date Noted  . Right foot pain 03/12/2018  . Acute right ankle pain 03/12/2018  . Left foot pain 01/14/2018  . Vaccine counseling 07/10/2017  . Plantar fasciitis 07/10/2017  . Acne 07/10/2017  . Essential hypertension, benign 05/29/2017  . Encounter for health maintenance examination in adult 07/23/2015  . 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 Impaired fasting blood sugar 07/23/2015  . Obesity 07/23/2015  . LVH (left ventricular hypertrophy) 07/23/2015  . Family history of premature CAD 07/23/2015  . Family history of diabetes mellitus 07/23/2015    Current Outpatient Medications on File Prior to Visit  Medication Sig Dispense Refill  . allopurinol (ZYLOPRIM) 300 MG tablet Take 1 tablet (300 mg total) by mouth daily. 90 tablet 3  . amLODipine (NORVASC) 10 MG tablet Take 1 tablet (10 mg total) by mouth daily. 30 tablet 3  . cyclobenzaprine (FLEXERIL) 10 MG tablet Take 1 tablet (10 mg total) by mouth 3 (three) times daily as needed for muscle spasms. 30 tablet 0  . diclofenac sodium (VOLTAREN) 1 % GEL Apply 2 g topically 2 (two) times daily as needed (apply to SORE SPOTS ON RIBS). Refills with PCP ONLY 100 g 0  . ibuprofen (ADVIL,MOTRIN) 800 MG tablet Take 800 mg by mouth every 8 (eight) hours as needed.    . traMADol (ULTRAM) 50 MG tablet Take 1 tablet (50 mg total) by mouth every 8 (eight) hours as needed. 30 tablet 0   No current facility-administered medications on file prior to visit.     No Known Allergies  Objective:  General: Alert and oriented x3 in no acute distress  Dermatology: Intact at cast with no issues.   Vascular: No edema, CFT intact in all  digits. Temperature gradient within normal limits.  Neurology: Gross sensation intact via light touch via light touch at exposed digits   Musculoskeletal: Cast intact   Assessment and Plan: Problem List Items Addressed This Visit    None    Visit Diagnoses    Tenosynovitis of ankle    -  Primary      -Complete examination performed -Cast intact  -Continue NWB with crutches -PT after cast is removed -Continue with Flexiril  -Continue with no work: Expected return 05-29-18; [employer] disability paperwork completed last visit -Patient to return to office in 1 week for cast removal or sooner if condition worsens.  [name], DPM

## 2018-04-24 ENCOUNTER — Telehealth: Payer: Self-pay

## 2018-04-24 NOTE — Telephone Encounter (Signed)
Jonathon Myszka was contacted today regarding the temporary reduction of OP Rehab Services due to concerns for community transmission of Covid-19.    The patient expressed interest in being contacted for an e-visit, virtual check in, or telehealth visit to continue their POC care, when those services become available.  PT instructed pt in need to sign up for MyChart and PT sent link to patient's email through MyChart to sign up. Pt confirmed link was received.     Patient plans to have cast removed on Tuesday, April, 7th, and plans to resume PT services following.    Outpatient Rehabilitation Services will follow up with patients at that time.

## 2018-04-29 ENCOUNTER — Ambulatory Visit: Payer: 59

## 2018-04-30 ENCOUNTER — Ambulatory Visit: Payer: 59 | Admitting: Sports Medicine

## 2018-04-30 ENCOUNTER — Encounter: Payer: Self-pay | Admitting: Sports Medicine

## 2018-04-30 ENCOUNTER — Other Ambulatory Visit: Payer: Self-pay

## 2018-04-30 VITALS — Temp 98.6°F

## 2018-04-30 DIAGNOSIS — M25571 Pain in right ankle and joints of right foot: Secondary | ICD-10-CM

## 2018-04-30 DIAGNOSIS — S93401D Sprain of unspecified ligament of right ankle, subsequent encounter: Secondary | ICD-10-CM

## 2018-04-30 DIAGNOSIS — M659 Synovitis and tenosynovitis, unspecified: Secondary | ICD-10-CM

## 2018-04-30 DIAGNOSIS — M25471 Effusion, right ankle: Secondary | ICD-10-CM

## 2018-04-30 DIAGNOSIS — M79671 Pain in right foot: Secondary | ICD-10-CM | POA: Diagnosis not present

## 2018-04-30 DIAGNOSIS — M65979 Unspecified synovitis and tenosynovitis, unspecified ankle and foot: Secondary | ICD-10-CM

## 2018-04-30 NOTE — Progress Notes (Signed)
Subjective: Victor Jensen is a 33 y.o. male patient who presents to office for evaluation of right ankle pain. Patient reports that cast seems to be helping and there is no pain that he feels; these last 2 weeks have been the best. Denies nausea, vomiting, fever, chills or consitutional symptoms  No other issues noted.  Patient Active Problem List   Diagnosis Date Noted  . Right foot pain 03/12/2018  . Acute right ankle pain 03/12/2018  . Left foot pain 01/14/2018  . Vaccine counseling 07/10/2017  . Plantar fasciitis 07/10/2017  . Acne 07/10/2017  . Essential hypertension, benign 05/29/2017  . Encounter for health maintenance examination in adult 07/23/2015  . 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 Impaired fasting blood sugar 07/23/2015  . Obesity 07/23/2015  . LVH (left ventricular hypertrophy) 07/23/2015  . Family history of premature CAD 07/23/2015  . Family history of diabetes mellitus 07/23/2015    Current Outpatient Medications on File Prior to Visit  Medication Sig Dispense Refill  . allopurinol (ZYLOPRIM) 300 MG tablet Take 1 tablet (300 mg total) by mouth daily. 90 tablet 3  . amLODipine (NORVASC) 10 MG tablet Take 1 tablet (10 mg total) by mouth daily. 30 tablet 3  . cyclobenzaprine (FLEXERIL) 10 MG tablet Take 1 tablet (10 mg total) by mouth 3 (three) times daily as needed for muscle spasms. 30 tablet 0  . diclofenac sodium (VOLTAREN) 1 % GEL Apply 2 g topically 2 (two) times daily as needed (apply to SORE SPOTS ON RIBS). Refills with PCP ONLY 100 g 0  . ibuprofen (ADVIL,MOTRIN) 800 MG tablet Take 800 mg by mouth every 8 (eight) hours as needed.    . traMADol (ULTRAM) 50 MG tablet Take 1 tablet (50 mg total) by mouth every 8 (eight) hours as needed. 30 tablet 0   No current facility-administered medications on file prior to visit.     No Known Allergies  Objective:  General: Alert and oriented x3 in no acute distress  Dermatology: Intact at cast with no  issues.   Vascular: No edema, CFT intact in all digits. Temperature gradient within normal limits.  Neurology: Gross sensation intact via light touch via light touch at exposed digits   Musculoskeletal: Cast intact upon removal mild tenderness with palpation at medial ankle on right at PT tendon course. Mild subjective   Assessment and Plan: Problem List Items Addressed This Visit    None    Visit Diagnoses    Tenosynovitis of ankle    -  Primary   Sprain of right ankle, unspecified ligament, subsequent encounter       Pain and swelling of right ankle       Pain in right foot          -Complete examination performed -Cast removed and not re-applied this visit since he is doing better -Continue NWB with crutches -Patient may resume passive range of motion and is awaiting PT to start on 4/20  -Continue with Flexiril  -Continue with no work: Expected return 05-29-18; [employer] disability paperwork completed 2 visits ago -Patient to return to office in 2 weeks for re-evaluation or sooner if condition worsens.  [name], DPM

## 2018-05-01 ENCOUNTER — Telehealth: Payer: Self-pay

## 2018-05-01 ENCOUNTER — Telehealth: Payer: Self-pay | Admitting: *Deleted

## 2018-05-01 ENCOUNTER — Ambulatory Visit: Payer: 59

## 2018-05-01 NOTE — Telephone Encounter (Signed)
Physical Therapist called Dr Wynema Birch office to notify referring doctor of plans to resume physical therapy services on April 10th, 2020 via telehealth services and wanted to confirm this was acceptable.   Also requested for clarification of Weight Bearing status for PT, as well as any limitations or restrictions for PROM/ AAROM/ and AROM.   PT left message on nurses line and requested call back with clarification to PT outpatient facility: [phone].

## 2018-05-01 NOTE — Telephone Encounter (Signed)
[facility] - Marykay Lex, PT request to resume PT with pt on 05/03/2018, would like to know his weightbearing status and if AROM to begin.

## 2018-05-01 NOTE — Telephone Encounter (Signed)
I responded directly to Shanda Bumps his PT who sent me a chart message regarding this already and told her that patient can start PT on 4/10 and must be partial weightbearing for 1-2 weeks then slowly transition to full weightbearing and may begin with PROM as increase to AROM as patient tolerates -Dr. Kathie Rhodes

## 2018-05-03 ENCOUNTER — Ambulatory Visit: Payer: 59 | Attending: Sports Medicine | Admitting: Physical Therapy

## 2018-05-03 ENCOUNTER — Encounter: Payer: Self-pay | Admitting: Physical Therapy

## 2018-05-03 DIAGNOSIS — R2689 Other abnormalities of gait and mobility: Secondary | ICD-10-CM | POA: Diagnosis not present

## 2018-05-03 DIAGNOSIS — M25671 Stiffness of right ankle, not elsewhere classified: Secondary | ICD-10-CM | POA: Diagnosis not present

## 2018-05-03 DIAGNOSIS — M25571 Pain in right ankle and joints of right foot: Secondary | ICD-10-CM | POA: Diagnosis not present

## 2018-05-03 DIAGNOSIS — M6281 Muscle weakness (generalized): Secondary | ICD-10-CM

## 2018-05-03 NOTE — Therapy (Signed)
[facility] [address] [place], [place], [address] Phone: [phone]   Fax:  [phone]  Physical Therapy Treatment  Patient Details  Name: Victor Jensen MRN: [id number] Date of Birth: [birth year] Referring Provider (PT): Asencion IslamStover, Titorya   Encounter Date: 05/03/2018  PT Therapy Telehealth Visit:  I connected with Victor BongJonathan Jensen  today at 0925am [facility] and verified that I am speaking with the correct person using two identifiers.  I discussed the limitations, risks, security and privacy concerns of performing an evaluation and management service by Webex and the availability of in person appointments.  I also discussed with the patient that there may be a patient responsible charge related to this service. The patient expressed understanding and agreed to proceed.    The patient's address was confirmed.  Identified to the patient that therapist is a licensed PT in the state of [place].  Verified phone # as [phone] to call in case of technical difficulties.     PT End of Session - 05/03/18 0933    Visit Number  2    Number of Visits  16    Date for PT Re-Evaluation  06/06/18    Authorization Type  [facility]  $20 Copay . No Auth required.   Telehealth- no copay, no visit limit    PT Start Time  [phone]    PT Stop Time  1008    PT Time Calculation (min)  43 min    Activity Tolerance  Patient tolerated treatment well    Behavior During Therapy  WFL for tasks assessed/performed       Past Medical History:  Diagnosis Date  . Allergic rhinitis   . Allergy   . Family history of premature CAD    father, died MI in early 6340s  . H/O cardiovascular stress test 01/2015   LVH, EF 65-70%, but other imaging advised, Dr. Antoine PocheHochrein  . History of MRI of chest    cardiac MRI.  Concentric LVH, mildly dilated LV. Dr. Antoine PocheHochrein  . History of sleep disturbance 04/2015   equivocal sleep study 04/2015  . Obesity     Past  Surgical History:  Procedure Laterality Date  . NO PAST SURGERIES  06/2015  . None      There were no vitals filed for this visit.  Subjective Assessment - 05/03/18 0928    Subjective  Catching a lot of cramps, foot is very tight. swelling going down every day. A little pain similar to what I had in the beginning. not wearing boot at night. shooting severe pain that might die down after 10-15 min or will take ibuprofen/aleve. Left knee is better, Rt knee is hurting.     Patient Stated Goals  return to gym, to walk normal, return to work.  Driving a forklift/     Currently in Pain?  Yes    Pain Score  3    up to 7-8/10 when it shoots   Pain Location  Ankle    Pain Orientation  Right    Pain Descriptors / Indicators  Tightness                       [facility] Adult PT Treatment/Exercise - 05/03/18 0001      Exercises   Exercises  Knee/Hip      Knee/Hip Exercises: Seated   Long Arc Quad  Right    Long Arc Quad Limitations  hold 5s  Other Seated Knee/Hip Exercises  long sitting quad sets 5s holds      Knee/Hip Exercises: Supine   Straight Leg Raises  20 reps      Knee/Hip Exercises: Sidelying   Hip ABduction  20 reps;Right      Ankle Exercises: Stretches   Gastroc Stretch  30 seconds;3 reps   with belt   Other Stretch  heel slides in seated for DF      Ankle Exercises: Seated   ABC's  1 rep    Towel Crunch  Other (comment)   toe scrunches   Other Seated Ankle Exercises  AROM PF/DF x20; arch lifts    Other Seated Ankle Exercises  AROM inversion/eversion x20             PT Education - 05/03/18 1009    Education Details  HEP, WB restrictions, anatomy of condition       PT Short Term Goals - 04/11/18 1558      PT SHORT TERM GOAL #1   Title  Pt will be I with initial HEP for continued strengthening and mobility in 4 weeks.      Baseline  No HEP     Time  4    Period  Weeks    Status  New    Target Date  05/09/18      PT SHORT TERM GOAL #2    Title  R ankle DF AROM will improve from -11 to 10 degrees in order to stand without pain.     Baseline  DF lacking 11 degrees from neutral    Time  4    Period  Weeks    Status  New    Target Date  05/09/18        PT Long Term Goals - 04/11/18 1601      PT LONG TERM GOAL #1   Title  R ankle PF strength will improve to 3+/5 in order to perform stairs pain free by   .     Baseline  R ankle PF MMT  2/5    Time  8    Period  Weeks    Status  New    Target Date  06/06/18      PT LONG TERM GOAL #2   Title  Pt will walk independently 200 feet without A.D. nor CAM boot with reciprocal gait without antalgia by 06/06/18.     Baseline  bilat crutches and cam boot with antalgia     Time  8    Period  Weeks    Status  New    Target Date  06/06/18      PT LONG TERM GOAL #3   Title  FOTO score will improve from 76% limited to <44% to demo improved function and mobility.     Baseline  FOT 76% limited    Time  8    Period  Weeks    Status  New    Target Date  06/06/18            Plan - 05/03/18 4540    Clinical Impression Statement  email prior for equipment. Passive DF appears to be lacking approx 5 deg from neutral. Significant difficulty with arch raises noted due to flat foot. some pain with end range movement in all directions. asked pt to remain in NWB status through the weekend and work on exercises for proper muscle activation and will progress into WB next visit.  PT Treatment/Interventions  ADLs/Self Care Home Management;Cryotherapy;Ultrasound;Moist Heat;Iontophoresis 4mg /ml Dexamethasone;Electrical Stimulation;DME Instruction;Gait training;Stair training;Functional mobility training;Manual techniques;Neuromuscular re-education;Balance training;Therapeutic exercise;Therapeutic activities;Patient/family education;Orthotic Fit/Training;Passive range of motion;Taping    PT Next Visit Plan  PWB 1-2 weeks, progress ROM as tolerated, how does Rt knee feel? try WB onto scale if pt  has one.     PT Home Exercise Plan  Ankle AROM: circles, alphabet, DF/PF, gastroc stretch. LAQ, SLR, hip abd, toe curls, arch lifts    Consulted and Agree with Plan of Care  Patient       Patient will benefit from skilled therapeutic intervention in order to improve the following deficits and impairments:  Abnormal gait, Pain, Increased muscle spasms, Decreased mobility, Decreased activity tolerance, Decreased endurance, Decreased range of motion, Decreased strength, Difficulty walking, Impaired flexibility, Increased edema, Decreased balance  Visit Diagnosis: Other abnormalities of gait and mobility  Muscle weakness (generalized)  Pain in right ankle and joints of right foot  Stiffness of right ankle, not elsewhere classified     Problem List Patient Active Problem List   Diagnosis Date Noted  . Right foot pain 03/12/2018  . Acute right ankle pain 03/12/2018  . Left foot pain 01/14/2018  . Vaccine counseling 07/10/2017  . Plantar fasciitis 07/10/2017  . Acne 07/10/2017  . Essential hypertension, benign 05/29/2017  . Encounter for health maintenance examination in adult 07/23/2015  . 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 Impaired fasting blood sugar 07/23/2015  . Obesity 07/23/2015  . LVH (left ventricular hypertrophy) 07/23/2015  . Family history of premature CAD 07/23/2015  . Family history of diabetes mellitus 07/23/2015    Filomeno Cromley C. Klaire Court PT, DPT 05/03/18 10:32 AM   [facility] [address] [place], [place], [address] Phone: [phone]   Fax:  [phone]  Name: Victor Jensen MRN: [id number] Date of Birth: [birth year]

## 2018-05-06 ENCOUNTER — Encounter: Payer: Self-pay | Admitting: Physical Therapy

## 2018-05-06 ENCOUNTER — Ambulatory Visit: Payer: 59 | Admitting: Physical Therapy

## 2018-05-06 DIAGNOSIS — M6281 Muscle weakness (generalized): Secondary | ICD-10-CM | POA: Diagnosis not present

## 2018-05-06 DIAGNOSIS — M25671 Stiffness of right ankle, not elsewhere classified: Secondary | ICD-10-CM

## 2018-05-06 DIAGNOSIS — M25571 Pain in right ankle and joints of right foot: Secondary | ICD-10-CM | POA: Diagnosis not present

## 2018-05-06 DIAGNOSIS — R2689 Other abnormalities of gait and mobility: Secondary | ICD-10-CM | POA: Diagnosis not present

## 2018-05-06 NOTE — Therapy (Signed)
[facility] [address] [place], [place], [address] Phone: [phone]   Fax:  [phone]  Physical Therapy Treatment I connected with  Delorise RoyalsJonathan D Verdejo on 05/06/18 by a video enabled telemedicine application and verified that I am speaking with the correct person using two identifiers.   I discussed the limitations of evaluation and management by telemedicine. The patient expressed understanding and agreed to proceed.   Patient Details  Name: Renaee MundaJonathan D Masih MRN: [id number] Date of Birth: 08-06-1985 Referring Provider (PT): Asencion IslamStover, Titorya   Encounter Date: 05/06/2018  PT End of Session - 05/06/18 1002    Visit Number  3    Number of Visits  16    Date for PT Re-Evaluation  06/06/18    Authorization Type  [facility]  $20 Copay . No Auth required.   Telehealth- no copay, no visit limit    PT Start Time  [id number]    PT Stop Time  0929    PT Time Calculation (min)  38 min    Activity Tolerance  Patient tolerated treatment well    Behavior During Therapy  [facility] for tasks assessed/performed       Past Medical History:  Diagnosis Date  . Allergic rhinitis   . Allergy   . Family history of premature CAD    father, died MI in early 2040s  . H/O cardiovascular stress test 01/2015   LVH, EF 65-70%, but other imaging advised, Dr. Antoine PocheHochrein  . History of MRI of chest    cardiac MRI.  Concentric LVH, mildly dilated LV. Dr. Antoine PocheHochrein  . History of sleep disturbance 04/2015   equivocal sleep study 04/2015  . Obesity     Past Surgical History:  Procedure Laterality Date  . NO PAST SURGERIES  06/2015  . None      There were no vitals filed for this visit.  Subjective Assessment - 05/06/18 1000    Subjective  Got a pop in my ankle and it felt better. with increased WB increases pain- I have been trying with boot since Dr Marylene LandStover said I can put weight. My back is bothing me more than anything.                        [facility]  Adult PT Treatment/Exercise - 05/06/18 0001      Manual Therapy   Manual therapy comments  edu in self STM to peroneals      Ankle Exercises: Stretches   Gastroc Stretch  2 reps;30 seconds      Ankle Exercises: Seated   Towel Inversion/Eversion  Other (comment)   eversion at 90 DF; isometric inversion with Lt foot   Other Seated Ankle Exercises  toe curls ankle 90 DF             PT Education - 05/06/18 1001    Education Details  ice, WB, anatomy of condition, gait pattern, expected soreness, exercise form/rationale, HEP    Person(s) Educated  Patient    Methods  Explanation;Demonstration;Verbal cues;Handout    Comprehension  Verbalized understanding;Need further instruction;Returned demonstration;Verbal cues required       PT Short Term Goals - 04/11/18 1558      PT SHORT TERM GOAL #1   Title  Pt will be I with initial HEP for continued strengthening and mobility in 4 weeks.      Baseline  No HEP     Time  4    Period  Weeks    Status  New    Target Date  05/09/18      PT SHORT TERM GOAL #2   Title  R ankle DF AROM will improve from -11 to 10 degrees in order to stand without pain.     Baseline  DF lacking 11 degrees from neutral    Time  4    Period  Weeks    Status  New    Target Date  05/09/18        PT Long Term Goals - 04/11/18 1601      PT LONG TERM GOAL #1   Title  R ankle PF strength will improve to 3+/5 in order to perform stairs pain free by   .     Baseline  R ankle PF MMT  2/5    Time  8    Period  Weeks    Status  New    Target Date  06/06/18      PT LONG TERM GOAL #2   Title  Pt will walk independently 200 feet without A.D. nor CAM boot with reciprocal gait without antalgia by 06/06/18.     Baseline  bilat crutches and cam boot with antalgia     Time  8    Period  Weeks    Status  New    Target Date  06/06/18      PT LONG TERM GOAL #3   Title  FOTO score will improve from 76% limited to <44% to demo improved function and mobility.      Baseline  FOT 76% limited    Time  8    Period  Weeks    Status  New    Target Date  06/06/18            Plan - 05/06/18 1004    Clinical Impression Statement  Began working on gait pattern without boot and without WB by using crutches. Pt reported feeling better with changes. Advised that he should still wear boot when casually moving around but when focused, work on moving foot correctly. Added home stretches for LBP.     Examination-Activity Limitations  Bathing;Lift;Stairs;Squat;Stand;Locomotion Level;Transfers;Carry;Sleep    PT Treatment/Interventions  ADLs/Self Care Home Management;Cryotherapy;Ultrasound;Moist Heat;Iontophoresis /ml Dexamethasone;Electrical Stimulation;DME Instruction;Gait training;Stair training;Functional mobility training;Manual techniques;Neuromuscular re-education;Balance training;Therapeutic exercise;Therapeutic activities;Patient/family education;Orthotic Fit/Training;Passive range of motion;Taping    PT Next Visit Plan  PWB 1-2 weeks, progress ROM as tolerated, how did back stretches go? recheck gait. added wB on scale?    PT Home Exercise Plan  Ankle AROM: circles, alphabet, DF/PF, gastroc stretch. LAQ, SLR, hip abd, toe curls, arch lifts, gait pattern, stretches for low back    Consulted and Agree with Plan of Care  Patient       Patient will benefit from skilled therapeutic intervention in order to improve the following deficits and impairments:  Abnormal gait, Pain, Increased muscle spasms, Decreased mobility, Decreased activity tolerance, Decreased endurance, Decreased range of motion, Decreased strength, Difficulty walking, Impaired flexibility, Increased edema, Decreased balance  Visit Diagnosis: Other abnormalities of gait and mobility  Muscle weakness (generalized)  Pain in right ankle and joints of right foot  Stiffness of right ankle, not elsewhere classified     Problem List Patient Active Problem List   Diagnosis Date Noted  .  Right foot pain 03/12/2018  . Acute right ankle pain 03/12/2018  . Left foot pain 01/14/2018  . Vaccine counseling 07/10/2017  . Plantar fasciitis 07/10/2017  . Acne  07/10/2017  . Essential hypertension, benign 05/29/2017  . Encounter for health maintenance examination in adult 07/23/2015  . 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 Impaired fasting blood sugar 07/23/2015  . Obesity 07/23/2015  . LVH (left ventricular hypertrophy) 07/23/2015  . Family history of premature CAD 07/23/2015  . Family history of diabetes mellitus 07/23/2015    Merilyn Pagan C. Jemarion Roycroft PT, DPT 05/06/18 10:09 AM   [facility] [address] [place], [place], [address] Phone: [phone]   Fax:  [phone]  Name: SAFAREE BERTE MRN: [id number] Date of Birth: Nov 08, 1985

## 2018-05-08 ENCOUNTER — Ambulatory Visit: Payer: 59 | Admitting: Physical Therapy

## 2018-05-08 ENCOUNTER — Encounter: Payer: Self-pay | Admitting: Physical Therapy

## 2018-05-08 DIAGNOSIS — M25571 Pain in right ankle and joints of right foot: Secondary | ICD-10-CM | POA: Diagnosis not present

## 2018-05-08 DIAGNOSIS — R2689 Other abnormalities of gait and mobility: Secondary | ICD-10-CM | POA: Diagnosis not present

## 2018-05-08 DIAGNOSIS — M6281 Muscle weakness (generalized): Secondary | ICD-10-CM

## 2018-05-08 DIAGNOSIS — M25671 Stiffness of right ankle, not elsewhere classified: Secondary | ICD-10-CM

## 2018-05-08 NOTE — Therapy (Signed)
[facility] [address] [place], [place], [address] Phone: [phone]   Fax:  [phone]  Physical Therapy Treatment  Patient Details  Name: Victor Jensen MRN: [id number] Date of Birth: [birth year] Referring Provider (PT): [name]   Encounter Date: 05/08/2018  PT End of Session - 05/08/18 0951    Visit Number  4    Number of Visits  16    Date for PT Re-Evaluation  06/06/18    Authorization Type  [facility]  $20 Copay . No Auth required.   Telehealth- no copay, no visit limit    PT Start Time  0904    PT Stop Time  0946    PT Time Calculation (min)  42 min    Activity Tolerance  Patient tolerated treatment well    Behavior During Therapy  [facility] for tasks assessed/performed       Past Medical History:  Diagnosis Date  . Allergic rhinitis   . Allergy   . Family history of premature CAD    father, died MI in early 4s  . H/O cardiovascular stress test 01/2015   LVH, EF 65-70%, but other imaging advised, Dr. Antoine Poche  . History of MRI of chest    cardiac MRI.  Concentric LVH, mildly dilated LV. Dr. Antoine Poche  . History of sleep disturbance 04/2015   equivocal sleep study 04/2015  . Obesity     Past Surgical History:  Procedure Laterality Date  . NO PAST SURGERIES  06/2015  . None      There were no vitals filed for this visit.  Subjective Assessment - 05/08/18 0907    Subjective  sharp pains at lateral malleolus with weight bearing. feels like I am guarding. using hallway for gait pattern. swelling with activity. aching on top of foot with increased walking.     Patient Stated Goals  return to gym, to walk normal, return to work.  Driving a forklift/     Currently in Pain?  Yes    Pain Score  --   severe with sharp pain during WB   Pain Location  Ankle    Pain Orientation  Right;Lateral    Pain Descriptors / Indicators  Sharp    Aggravating Factors   weight bearing    Pain Relieving Factors  rest, ice                        [facility] Adult PT Treatment/Exercise - 05/08/18 0001      Knee/Hip Exercises: Supine   Bridges Limitations  mini bridge legs over arm of couch      Ankle Exercises: Stretches   Soleus Stretch  2 reps;30 seconds    Gastroc Stretch  2 reps;20 seconds    Other Stretch  foot planted- anterior tibial translation      Ankle Exercises: Standing   Other Standing Ankle Exercises  weight shift lateral & with Rt foot back- using crutches      Ankle Exercises: Seated   [place] Pickup  toe crunches on carpet    Other Seated Ankle Exercises  toe yoga, arch exercise               PT Short Term Goals - 04/11/18 1558      PT SHORT TERM GOAL #1   Title  Pt will be I with initial HEP for continued strengthening and mobility in 4 weeks.      Baseline  No HEP  Time  4    Period  Weeks    Status  New    Target Date  05/09/18      PT SHORT TERM GOAL #2   Title  R ankle DF AROM will improve from -11 to 10 degrees in order to stand without pain.     Baseline  DF lacking 11 degrees from neutral    Time  4    Period  Weeks    Status  New    Target Date  05/09/18        PT Long Term Goals - 04/11/18 1601      PT LONG TERM GOAL #1   Title  R ankle PF strength will improve to 3+/5 in order to perform stairs pain free by   .     Baseline  R ankle PF MMT  2/5    Time  8    Period  Weeks    Status  New    Target Date  06/06/18      PT LONG TERM GOAL #2   Title  Pt will walk independently 200 feet without A.D. nor CAM boot with reciprocal gait without antalgia by 06/06/18.     Baseline  bilat crutches and cam boot with antalgia     Time  8    Period  Weeks    Status  New    Target Date  06/06/18      PT LONG TERM GOAL #3   Title  FOTO score will improve from 76% limited to <44% to demo improved function and mobility.     Baseline  FOT 76% limited    Time  8    Period  Weeks    Status  New    Target Date  06/06/18            Plan -  05/08/18 0951    Clinical Impression Statement  Continued edema noted-especially on medial ankle. Improved gait pattern with heel whip in swing through due to limited DF. would benefit from custom-fit inserts to place in his work boots which have a steel toe.     PT Treatment/Interventions  ADLs/Self Care Home Management;Cryotherapy;Ultrasound;Moist Heat;Iontophoresis /ml Dexamethasone;Electrical Stimulation;DME Instruction;Gait training;Stair training;Functional mobility training;Manual techniques;Neuromuscular re-education;Balance training;Therapeutic exercise;Therapeutic activities;Patient/family education;Orthotic Fit/Training;Passive range of motion;Taping    PT Next Visit Plan  step ups- solid & soft    PT Home Exercise Plan  Ankle AROM: circles, alphabet, DF/PF, gastroc stretch. LAQ, SLR, hip abd, toe curls, arch lifts, gait pattern, stretches for low back; anterior tibial translation in seated    Consulted and Agree with Plan of Care  Patient       Patient will benefit from skilled therapeutic intervention in order to improve the following deficits and impairments:  Abnormal gait, Pain, Increased muscle spasms, Decreased mobility, Decreased activity tolerance, Decreased endurance, Decreased range of motion, Decreased strength, Difficulty walking, Impaired flexibility, Increased edema, Decreased balance  Visit Diagnosis: Other abnormalities of gait and mobility  Muscle weakness (generalized)  Pain in right ankle and joints of right foot  Stiffness of right ankle, not elsewhere classified     Problem List Patient Active Problem List   Diagnosis Date Noted  . Right foot pain 03/12/2018  . Acute right ankle pain 03/12/2018  . Left foot pain 01/14/2018  . Vaccine counseling 07/10/2017  . Plantar fasciitis 07/10/2017  . Acne 07/10/2017  . Essential hypertension, benign 05/29/2017  . Encounter for health maintenance examination in adult 07/23/2015  .  Elevated uric acid in blood  07/23/2015  . Impaired fasting blood sugar 07/23/2015  . Obesity 07/23/2015  . LVH (left ventricular hypertrophy) 07/23/2015  . Family history of premature CAD 07/23/2015  . Family history of diabetes mellitus 07/23/2015    Adely Facer C. Anzlee Hinesley PT, DPT 05/08/18 9:56 AM   [facility] [address] [place], [place], [address] Phone: [phone]   Fax:  [phone]  Name: Victor Jensen MRN: [id number] Date of Birth: [birth year]

## 2018-05-13 ENCOUNTER — Ambulatory Visit: Payer: 59 | Admitting: Physical Therapy

## 2018-05-13 ENCOUNTER — Encounter: Payer: Self-pay | Admitting: Physical Therapy

## 2018-05-13 ENCOUNTER — Ambulatory Visit: Payer: 59

## 2018-05-13 DIAGNOSIS — M25671 Stiffness of right ankle, not elsewhere classified: Secondary | ICD-10-CM | POA: Diagnosis not present

## 2018-05-13 DIAGNOSIS — M25571 Pain in right ankle and joints of right foot: Secondary | ICD-10-CM | POA: Diagnosis not present

## 2018-05-13 DIAGNOSIS — R2689 Other abnormalities of gait and mobility: Secondary | ICD-10-CM | POA: Diagnosis not present

## 2018-05-13 DIAGNOSIS — M6281 Muscle weakness (generalized): Secondary | ICD-10-CM | POA: Diagnosis not present

## 2018-05-13 NOTE — Therapy (Signed)
[facility] Outpatient Rehabilitation [facility] [address] [place], [place], [address] Phone: [phone]   Fax:  [phone]  Physical Therapy Treatment I connected with  Victor Jensen on 05/13/18 by a video enabled telemedicine application and verified that I am speaking with the correct person using two identifiers.   I discussed the limitations of evaluation and management by telemedicine. The patient expressed understanding and agreed to proceed.    Patient Details  Name: Victor Jensen MRN: [id number] Date of Birth: May 28, 1985 Referring Provider (PT): Victor Jensen   Encounter Date: 05/13/2018  PT End of Session - 05/13/18 0904    Visit Number  5    Number of Visits  16    Date for PT Re-Evaluation  06/06/18    Authorization Type  [facility]  $20 Copay . No Auth required.   Telehealth- no copay, no visit limit    PT Start Time  0900    PT Stop Time  0943    PT Time Calculation (min)  43 min    Activity Tolerance  Patient tolerated treatment well    Behavior During Therapy  WFL for tasks assessed/performed       Past Medical History:  Diagnosis Date  . Allergic rhinitis   . Allergy   . Family history of premature CAD    father, died MI in early 58s  . H/O cardiovascular stress test 01/2015   LVH, EF 65-70%, but other imaging advised, Dr. Antoine Poche  . History of MRI of chest    cardiac MRI.  Concentric LVH, mildly dilated LV. Dr. Antoine Poche  . History of sleep disturbance 04/2015   equivocal sleep study 04/2015  . Obesity     Past Surgical History:  Procedure Laterality Date  . NO PAST SURGERIES  06/2015  . None      There were no vitals filed for this visit.  Subjective Assessment - 05/13/18 0901    Subjective  I have been hobbling with one crutch. gives out when trying to step on scale. calf is extremely tight. top part of foot feels like a pin is jabbing me when I roll over my toes.          [facility] PT Assessment - 05/13/18 0001      AROM   Right Ankle Dorsiflexion  4      PROM   Right Ankle Dorsiflexion  5                   [facility] Adult PT Treatment/Exercise - 05/13/18 0001      Ankle Exercises: Stretches   Gastroc Stretch  2 reps;30 seconds    Other Stretch  gastroc stretch- foot on book    Other Stretch  foot planted- anterior tibial translation- towel under arch of foot      Ankle Exercises: Standing   SLS  step up onto 1.5" book- crutches for support    Heel Raises  Both;10 reps   crutches for support   Other Standing Ankle Exercises  anterior weight shift on to book 1.5"    Other Standing Ankle Exercises  gait with single crutch      Ankle Exercises: Seated   Other Seated Ankle Exercises  toe crunches    Other Seated Ankle Exercises  toe yoga               PT Short Term Goals - 04/11/18 1558      PT SHORT TERM GOAL #1  Title  Pt will be I with initial HEP for continued strengthening and mobility in 4 weeks.      Baseline  No HEP     Time  4    Period  Weeks    Status  New    Target Date  05/09/18      PT SHORT TERM GOAL #2   Title  R ankle DF AROM will improve from -11 to 10 degrees in order to stand without pain.     Baseline  DF lacking 11 degrees from neutral    Time  4    Period  Weeks    Status  New    Target Date  05/09/18        PT Long Term Goals - 04/11/18 1601      PT LONG TERM GOAL #1   Title  R ankle PF strength will improve to 3+/5 in order to perform stairs pain free by   .     Baseline  R ankle PF MMT  2/5    Time  8    Period  Weeks    Status  New    Target Date  06/06/18      PT LONG TERM GOAL #2   Title  Pt will walk independently 200 feet without A.D. nor CAM boot with reciprocal gait without antalgia by 06/06/18.     Baseline  bilat crutches and cam boot with antalgia     Time  8    Period  Weeks    Status  New    Target Date  06/06/18      PT LONG TERM GOAL #3   Title  FOTO score will improve from 76% limited to <44% to demo improved  function and mobility.     Baseline  FOT 76% limited    Time  8    Period  Weeks    Status  New    Target Date  06/06/18            Plan - 05/13/18 [id number]    Clinical Impression Statement  Had pt move single crutch to Lt side of body which improved balance. Good improvements in ROM noted. Would like to bring him in for an in-person visit next week.     PT Treatment/Interventions  ADLs/Self Care Home Management;Cryotherapy;Ultrasound;Moist Heat;Iontophoresis 4mg /ml Dexamethasone;Electrical Stimulation;DME Instruction;Gait training;Stair training;Functional mobility training;Manual techniques;Neuromuscular re-education;Balance training;Therapeutic exercise;Therapeutic activities;Patient/family education;Orthotic Fit/Training;Passive range of motion;Taping    PT Next Visit Plan  soft step up, core strength, wear shoes    PT Home Exercise Plan  Ankle AROM: circles, alphabet, DF/PF, gastroc stretch. LAQ, SLR, hip abd, toe curls, arch lifts, gait pattern, stretches for low back; anterior tibial translation in seated    Consulted and Agree with Plan of Care  Patient       Patient will benefit from skilled therapeutic intervention in order to improve the following deficits and impairments:  Abnormal gait, Pain, Increased muscle spasms, Decreased mobility, Decreased activity tolerance, Decreased endurance, Decreased range of motion, Decreased strength, Difficulty walking, Impaired flexibility, Increased edema, Decreased balance  Visit Diagnosis: Other abnormalities of gait and mobility  Muscle weakness (generalized)  Pain in right ankle and joints of right foot  Stiffness of right ankle, not elsewhere classified     Problem List Patient Active Problem List   Diagnosis Date Noted  . Right foot pain 03/12/2018  . Acute right ankle pain 03/12/2018  . Left foot pain 01/14/2018  . Vaccine  counseling 07/10/2017  . Plantar fasciitis 07/10/2017  . Acne 07/10/2017  . Essential hypertension,  benign 05/29/2017  . Encounter for health maintenance examination in adult 07/23/2015  . 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 Impaired fasting blood sugar 07/23/2015  . Obesity 07/23/2015  . LVH (left ventricular hypertrophy) 07/23/2015  . Family history of premature CAD 07/23/2015  . Family history of diabetes mellitus 07/23/2015    Jamielyn Petrucci C. Mercedez Boule PT, DPT 05/13/18 10:05 AM   [facility] [address] [place], [place], [address] Phone: [phone]   Fax:  [phone]  Name: Victor Jensen MRN: [id number] Date of Birth: 04-18-1985

## 2018-05-14 ENCOUNTER — Encounter: Payer: Self-pay | Admitting: Sports Medicine

## 2018-05-14 ENCOUNTER — Ambulatory Visit: Payer: 59 | Admitting: Sports Medicine

## 2018-05-14 ENCOUNTER — Other Ambulatory Visit: Payer: Self-pay

## 2018-05-14 DIAGNOSIS — M25571 Pain in right ankle and joints of right foot: Secondary | ICD-10-CM

## 2018-05-14 DIAGNOSIS — S93401D Sprain of unspecified ligament of right ankle, subsequent encounter: Secondary | ICD-10-CM

## 2018-05-14 DIAGNOSIS — M65979 Unspecified synovitis and tenosynovitis, unspecified ankle and foot: Secondary | ICD-10-CM

## 2018-05-14 DIAGNOSIS — M659 Synovitis and tenosynovitis, unspecified: Secondary | ICD-10-CM

## 2018-05-14 DIAGNOSIS — M25471 Effusion, right ankle: Secondary | ICD-10-CM

## 2018-05-14 DIAGNOSIS — M79671 Pain in right foot: Secondary | ICD-10-CM | POA: Diagnosis not present

## 2018-05-14 NOTE — [place] Notes (Signed)
Subjective: Victor Jensen is a 33 y.o. male patient who presents to office for evaluation of right ankle pain. Patient reports that his pain seems to be doing much better; has had virtual visit with PT and reports that he has stopped taking flexeril and pain medication. Patient is using crutches and Cam boot. Denies nausea, vomiting, fever, chills or consitutional symptoms  No other issues noted.  Patient Active Problem List   Diagnosis Date Noted  . Right foot pain 03/12/2018  . Acute right ankle pain 03/12/2018  . Left foot pain 01/14/2018  . Vaccine counseling 07/10/2017  . Plantar fasciitis 07/10/2017  . Acne 07/10/2017  . Essential hypertension, benign 05/29/2017  . Encounter for health maintenance examination in adult 07/23/2015  . 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 Impaired fasting blood sugar 07/23/2015  . Obesity 07/23/2015  . LVH (left ventricular hypertrophy) 07/23/2015  . Family history of premature CAD 07/23/2015  . Family history of diabetes mellitus 07/23/2015    Current Outpatient Medications on File Prior to Visit  Medication Sig Dispense Refill  . allopurinol (ZYLOPRIM) 300 MG tablet Take 1 tablet (300 mg total) by mouth daily. 90 tablet 3  . amLODipine (NORVASC) 10 MG tablet Take 1 tablet (10 mg total) by mouth daily. 30 tablet 3  . cyclobenzaprine (FLEXERIL) 10 MG tablet Take 1 tablet (10 mg total) by mouth 3 (three) times daily as needed for muscle spasms. 30 tablet 0  . diclofenac sodium (VOLTAREN) 1 % GEL Apply 2 g topically 2 (two) times daily as needed (apply to SORE SPOTS ON RIBS). Refills with PCP ONLY 100 g 0  . ibuprofen (ADVIL,MOTRIN) 800 MG tablet Take 800 mg by mouth every 8 (eight) hours as needed.    . traMADol (ULTRAM) 50 MG tablet Take 1 tablet (50 mg total) by mouth every 8 (eight) hours as needed. 30 tablet 0   No current facility-administered medications on file prior to visit.     No Known Allergies  Objective:  General: Alert  and oriented x3 in no acute distress  Dermatology: Intact, no breaks in skin, no signs of infection.  Vascular: Trace edema at right ankle, CFT intact in all digits. Temperature gradient within normal limits.  Neurology: Gross sensation intact via light touch via light touch at exposed digits   Musculoskeletal: + mild tenderness with palpation at medial ankle on right at PT tendon course. Mild subjective shooting with end ankle range of motion with tightness of calf on right.   Assessment and Plan: Problem List Items Addressed This Visit    None    Visit Diagnoses    Tenosynovitis of ankle    -  Primary   Sprain of right ankle, unspecified ligament, subsequent encounter       Pain and swelling of right ankle       Pain in right foot          -Complete examination performed -May discontinue crutches and CAM boot slowly under the supervision of PT since patient is doing better -Dispensed Powersteps for patient to use in his work boots until he can be casted for custom insoles  -Continue with no work: Expected return 05-29-18; [place] disability paperwork completed this visit; Will re-evaluate return to work at next visit and possibly dispensed trilock ankle brace for patient to use when he goes back to work -Patient to return to office in 2 weeks for re-evaluation or sooner if condition worsens.  [name], DPM

## 2018-05-15 ENCOUNTER — Ambulatory Visit: Payer: 59 | Admitting: Physical Therapy

## 2018-05-15 ENCOUNTER — Encounter: Payer: Self-pay | Admitting: Physical Therapy

## 2018-05-15 ENCOUNTER — Telehealth: Payer: Self-pay | Admitting: Podiatry

## 2018-05-15 DIAGNOSIS — M6281 Muscle weakness (generalized): Secondary | ICD-10-CM | POA: Diagnosis not present

## 2018-05-15 DIAGNOSIS — M25671 Stiffness of right ankle, not elsewhere classified: Secondary | ICD-10-CM

## 2018-05-15 DIAGNOSIS — M25571 Pain in right ankle and joints of right foot: Secondary | ICD-10-CM

## 2018-05-15 DIAGNOSIS — R2689 Other abnormalities of gait and mobility: Secondary | ICD-10-CM

## 2018-05-15 NOTE — Patient Instructions (Signed)
Access Code: [email or web address]  URL: [email or web address]  Date: 05/15/2018  Prepared by: Army Fossa   Exercises  Seated Heel Toe Raises - 20 reps - 2 sets - 2x daily - 7x weekly  Isometric Ankle Inversion - 10 reps - 1 sets - 5s hold - 1x daily - 7x weekly  Standing Weight Shifting Forward and Backward - 20 reps - 2 sets - 1x daily - 7x weekly

## 2018-05-15 NOTE — Therapy (Signed)
[facility] Outpatient Rehabilitation [facility] [address] [place], [place], [address] Phone: [phone]   Fax:  [phone]  Physical Therapy Treatment I connected with  [name] on 05/15/18 by a video enabled telemedicine application and verified that I am speaking with the correct person using two identifiers.   I discussed the limitations of evaluation and management by telemedicine. The patient expressed understanding and agreed to proceed.    Patient Details  Name: Victor Jensen MRN: [id number] Date of Birth: 1985-11-13 Referring Provider (PT): [name]   Encounter Date: 05/15/2018  PT End of Session - 05/15/18 0907    Visit Number  6    Number of Visits  16    Date for PT Re-Evaluation  06/06/18    Authorization Type  [facility]  $20 Copay . No Auth required.   Telehealth- no copay, no visit limit    PT Start Time  0900    PT Stop Time  0941    PT Time Calculation (min)  41 min    Activity Tolerance  Patient tolerated treatment well    Behavior During Therapy  WFL for tasks assessed/performed       Past Medical History:  Diagnosis Date  . Allergic rhinitis   . Allergy   . Family history of premature CAD    father, died MI in early 18s  . H/O cardiovascular stress test 01/2015   LVH, EF 65-70%, but other imaging advised, Dr. Antoine Poche  . History of MRI of chest    cardiac MRI.  Concentric LVH, mildly dilated LV. Dr. Antoine Poche  . History of sleep disturbance 04/2015   equivocal sleep study 04/2015  . Obesity     Past Surgical History:  Procedure Laterality Date  . NO PAST SURGERIES  06/2015  . None      There were no vitals filed for this visit.  Subjective Assessment - 05/15/18 0901    Subjective  Do not have crutches- in car that wife left with today. No pain this morning. Have not tried inserts yet.     Patient Stated Goals  return to gym, to walk normal, return to work.  Driving a forklift/     Currently in Pain?   No/denies                       [facility] Adult PT Treatment/Exercise - 05/15/18 0001      Manual Therapy   Manual therapy comments  STM to calf by pt       Ankle Exercises: Stretches   Soleus Stretch  2 reps;30 seconds    Gastroc Stretch  2 reps;30 seconds      Ankle Exercises: Seated   Towel Inversion/Eversion  --   isometric inversion squeezing towel   Toe Raise  --   seated heel/toe   Other Seated Ankle Exercises  seated rotation pressure into soft cushion      Ankle Exercises: Standing   Other Standing Ankle Exercises  A/P & lateral weight shift feet planted   pressure at first ray- no inserts in            PT Education - 05/15/18 0954    Education Details  edema- compression socks, ice. tri-lock brace, gradual wear of inserts, WB points of foot, exercise form/rationale    Person(s) Educated  Patient    Methods  Explanation;Demonstration;Verbal cues;Handout    Comprehension  Verbalized understanding;Need further instruction;Returned demonstration;Verbal cues required  PT Short Term Goals - 04/11/18 1558      PT SHORT TERM GOAL #1   Title  Pt will be I with initial HEP for continued strengthening and mobility in 4 weeks.      Baseline  No HEP     Time  4    Period  Weeks    Status  New    Target Date  05/09/18      PT SHORT TERM GOAL #2   Title  R ankle DF AROM will improve from -11 to 10 degrees in order to stand without pain.     Baseline  DF lacking 11 degrees from neutral    Time  4    Period  Weeks    Status  New    Target Date  05/09/18        PT Long Term Goals - 04/11/18 1601      PT LONG TERM GOAL #1   Title  R ankle PF strength will improve to 3+/5 in order to perform stairs pain free by   .     Baseline  R ankle PF MMT  2/5    Time  8    Period  Weeks    Status  New    Target Date  06/06/18      PT LONG TERM GOAL #2   Title  Pt will walk independently 200 feet without A.D. nor CAM boot with reciprocal gait without  antalgia by 06/06/18.     Baseline  bilat crutches and cam boot with antalgia     Time  8    Period  Weeks    Status  New    Target Date  06/06/18      PT LONG TERM GOAL #3   Title  FOTO score will improve from 76% limited to <44% to demo improved function and mobility.     Baseline  FOT 76% limited    Time  8    Period  Weeks    Status  New    Target Date  06/06/18            Plan - 05/15/18 0941    Clinical Impression Statement  Did not have crutches today so we focused on coordination of ankle without WB- placing pressure through 3 main pressure points and removing it from the area of the fallen arch. He will be in clinic next visit, Asked him to try inserts in shoes for comfort. Dr stover cleared him to begin working away from crutches as tolerated and will clear him for work approx 5/6. Tri-lock brace possibly dispensed at f/u.     PT Treatment/Interventions  ADLs/Self Care Home Management;Cryotherapy;Ultrasound;Moist Heat;Iontophoresis 4mg /ml Dexamethasone;Electrical Stimulation;DME Instruction;Gait training;Stair training;Functional mobility training;Manual techniques;Neuromuscular re-education;Balance training;Therapeutic exercise;Therapeutic activities;Patient/family education;Orthotic Fit/Training;Passive range of motion;Taping    PT Next Visit Plan  check mobility, gait eval, [place] bands, standing mobs on step, side stepping, retro stepping    PT Home Exercise Plan  Ankle AROM: circles, alphabet, DF/PF, gastroc stretch. LAQ, SLR, hip abd, toe curls, arch lifts, gait pattern, stretches for low back; anterior tibial translation in seated; seated heel/toe, iso inversion, standing a/p weight shift    Consulted and Agree with Plan of Care  Patient       Patient will benefit from skilled therapeutic intervention in order to improve the following deficits and impairments:  Abnormal gait, Pain, Increased muscle spasms, Decreased mobility, Decreased activity tolerance, Decreased  endurance, Decreased range of motion,  Decreased strength, Difficulty walking, Impaired flexibility, Increased edema, Decreased balance  Visit Diagnosis: Other abnormalities of gait and mobility  Muscle weakness (generalized)  Pain in right ankle and joints of right foot  Stiffness of right ankle, not elsewhere classified     Problem List Patient Active Problem List   Diagnosis Date Noted  . Right foot pain 03/12/2018  . Acute right ankle pain 03/12/2018  . Left foot pain 01/14/2018  . Vaccine counseling 07/10/2017  . Plantar fasciitis 07/10/2017  . Acne 07/10/2017  . Essential hypertension, benign 05/29/2017  . Encounter for health maintenance examination in adult 07/23/2015  . 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 Impaired fasting blood sugar 07/23/2015  . Obesity 07/23/2015  . LVH (left ventricular hypertrophy) 07/23/2015  . Family history of premature CAD 07/23/2015  . Family history of diabetes mellitus 07/23/2015    Micheline Markes C. Pluma Diniz PT, DPT 05/15/18 9:55 AM   [facility] [address] [place], [place], [address] Phone: [phone]   Fax:  [phone]  Name: RADEEN KELL MRN: [id number] Date of Birth: Sep 13, 1985

## 2018-05-15 NOTE — Telephone Encounter (Signed)
Pt left a message stating she has appt this Friday with tyler terry np and he works with Dr Charyl Dancer sun..  I lvm for pt to call me back to discuss.  She returned call and I explained that the appt and office note has to be with Dr Wynelle Link per medicare guidelines. She said thanks for letting her know and she was going to make sure she is seeing Dr Wynelle Link Friday.

## 2018-05-20 ENCOUNTER — Ambulatory Visit: Payer: 59 | Admitting: Physical Therapy

## 2018-05-20 ENCOUNTER — Encounter: Payer: Self-pay | Admitting: Physical Therapy

## 2018-05-20 ENCOUNTER — Other Ambulatory Visit: Payer: Self-pay

## 2018-05-20 DIAGNOSIS — M25571 Pain in right ankle and joints of right foot: Secondary | ICD-10-CM | POA: Diagnosis not present

## 2018-05-20 DIAGNOSIS — M6281 Muscle weakness (generalized): Secondary | ICD-10-CM

## 2018-05-20 DIAGNOSIS — R2689 Other abnormalities of gait and mobility: Secondary | ICD-10-CM

## 2018-05-20 DIAGNOSIS — M25671 Stiffness of right ankle, not elsewhere classified: Secondary | ICD-10-CM | POA: Diagnosis not present

## 2018-05-20 NOTE — Therapy (Signed)
[facility] [address] [place], [place], [address] Phone: [phone]   Fax:  [phone]  Physical Therapy Treatment  Patient Details  Name: Victor Jensen MRN: [id number] Date of Birth: 05-Feb-1985 Referring Provider (Jensen): [name]   Encounter Date: 05/20/2018  Jensen End of Session - 05/20/18 0913    Visit Number  7    Number of Visits  16    Date for Jensen Re-Evaluation  06/06/18    Authorization Type  [facility]  $20 Copay . No Auth required.   Telehealth- no copay, no visit limit    Jensen Start Time  [phone]    Jensen Stop Time  0913    Jensen Time Calculation (min)  62 min    Activity Tolerance  Patient tolerated treatment well    Behavior During Therapy  [facility] for tasks assessed/performed       Past Medical History:  Diagnosis Date  . Allergic rhinitis   . Allergy   . Family history of premature CAD    father, died MI in early 64s  . H/O cardiovascular stress test 01/2015   LVH, EF 65-70%, but other imaging advised, Dr. Antoine Poche  . History of MRI of chest    cardiac MRI.  Concentric LVH, mildly dilated LV. Dr. Antoine Poche  . History of sleep disturbance 04/2015   equivocal sleep study 04/2015  . Obesity     Past Surgical History:  Procedure Laterality Date  . NO PAST SURGERIES  06/2015  . None      There were no vitals filed for this visit.  Subjective Assessment - 05/20/18 0813    Subjective  Fell on Saturday when I tripped over a shoe and fell on Lt knee. No pain in Right foot. 5-10 min walking limit for back.     Patient Stated Goals  return to gym, to walk normal, return to work.  Driving a forklift/     Currently in Pain?  No/denies         [facility] Jensen Assessment - 05/20/18 0001      Assessment   Medical Diagnosis  R ankle sprain    Referring Provider (Jensen)  Marylene Land, Titorya    Onset Date/Surgical Date  03/09/18    Hand Dominance  Right      Restrictions   Weight Bearing Restrictions  No    RLE Weight Bearing   Weight bearing as tolerated      Balance Screen   Has the patient fallen in the past 6 months  Yes    How many times?  2    Has the patient had a decrease in activity level because of a fear of falling?   Yes    Is the patient reluctant to leave their home because of a fear of falling?   No      Home Public house manager residence      Prior Function   Vocation  Full time employment    Vocation Requirements  Drive fork lift for [employer]   Overall Cognitive Status  Within Functional Limits for tasks assessed      Circumferential Edema   Circumferential - Right  27.5      Figure 8 Edema   Figure 8 - Right   66      Posture/Postural Control   Posture Comments  Pes planus bilat      AROM   Right Ankle  Dorsiflexion  1      PROM   Right Ankle Dorsiflexion  5   pull in calf     Strength   Right Ankle Dorsiflexion  5/5    Right Ankle Plantar Flexion  3-/5    Right Ankle Inversion  4+/5    Right Ankle Eversion  5/5      Palpation   Palpation comment  1st ray tenderness      Ambulation/Gait   Gait Comments  medial heel hip-circumduction & hip hike                   [facility] Adult Jensen Treatment/Exercise - 05/20/18 0001      Knee/Hip Exercises: Standing   Gait Training  roll over, toe off, heel strike- with & without tape      Modalities   Modalities  Moist Heat      Moist Heat Therapy   Number Minutes Moist Heat  10 Minutes    Moist Heat Location  Other (comment)   Rt calf     Manual Therapy   Manual Therapy  Taping;Passive ROM    Manual therapy comments  IASTM gastroc    Passive ROM  DF stretch    Kinesiotex  --   mcconnel 1st ray X            Jensen Education - 05/20/18 0923    Education Details  Self STM, gait pattern, exercise form/rationale, looking at xray, importance of support and points of support    Person(s) Educated  Patient    Methods  Explanation;Demonstration;Tactile cues;Verbal cues;Handout     Comprehension  Verbalized understanding;Need further instruction;Returned demonstration;Verbal cues required;Tactile cues required       Jensen Short Term Goals - 05/20/18 0829      Jensen SHORT TERM GOAL #1   Title  Jensen will be I with initial HEP for continued strengthening and mobility in 4 weeks.      Status  Achieved      Jensen SHORT TERM GOAL #2   Title  R ankle DF AROM will improve from -11 to 10 degrees in order to stand without pain.     Baseline  5 PROM, 1 AROM   calf tightness    Status  On-going        Jensen Long Term Goals - 04/11/18 1601      Jensen LONG TERM GOAL #1   Title  R ankle PF strength will improve to 3+/5 in order to perform stairs pain free by   .     Baseline  R ankle PF MMT  2/5    Time  8    Period  Weeks    Status  New    Target Date  06/06/18      Jensen LONG TERM GOAL #2   Title  Jensen will walk independently 200 feet without A.D. nor CAM boot with reciprocal gait without antalgia by 06/06/18.     Baseline  bilat crutches and cam boot with antalgia     Time  8    Period  Weeks    Status  New    Target Date  06/06/18      Jensen LONG TERM GOAL #3   Title  FOTO score will improve from 76% limited to <44% to demo improved function and mobility.     Baseline  FOT 76% limited    Time  8    Period  Weeks    Status  New    Target Date  06/06/18            Plan - 05/20/18 [id number]    Clinical Impression Statement  Jensen presented with crutches today but was not placing weight through them. Noted medial heel whip resulting in kinetic chain deviations. significant gastroc/solus tightness with trigger points. Discussion held regarding shoes- decreased first ray pain with taping and tightening of shoe around ankle- was not lacing high top in a snug position. Improving strength but pain with WB PF so it was not tested in SLS. Significant lean to Rt side in Rt SLS, even with UE support. Will continue to benefit from gait training and targeted strengthening to provide support to foot.  Will benefit from custom insert molds to provide support under first ray of foot.     Jensen Treatment/Interventions  ADLs/Self Care Home Management;Cryotherapy;Ultrasound;Moist Heat;Iontophoresis 4mg /ml Dexamethasone;Electrical Stimulation;DME Instruction;Gait training;Stair training;Functional mobility training;Manual techniques;Neuromuscular re-education;Balance training;Therapeutic exercise;Therapeutic activities;Patient/family education;Orthotic Fit/Training;Passive range of motion;Taping    Jensen Next Visit Plan  standing mobs on step, side stepping, retro stepping    Jensen Home Exercise Plan  Ankle AROM: circles, alphabet, DF/PF, gastroc stretch. LAQ, SLR, hip abd, toe curls, arch lifts, gait pattern, stretches for low back; anterior tibial translation in seated; seated heel/toe, iso inversion, standing a/p weight shift; ankle tband 3-way    Consulted and Agree with Plan of Care  Patient       Patient will benefit from skilled therapeutic intervention in order to improve the following deficits and impairments:  Abnormal gait, Pain, Increased muscle spasms, Decreased mobility, Decreased activity tolerance, Decreased endurance, Decreased range of motion, Decreased strength, Difficulty walking, Impaired flexibility, Increased edema, Decreased balance  Visit Diagnosis: Other abnormalities of gait and mobility  Muscle weakness (generalized)  Pain in right ankle and joints of right foot  Stiffness of right ankle, not elsewhere classified     Problem List Patient Active Problem List   Diagnosis Date Noted  . Right foot pain 03/12/2018  . Acute right ankle pain 03/12/2018  . Left foot pain 01/14/2018  . Vaccine counseling 07/10/2017  . Plantar fasciitis 07/10/2017  . Acne 07/10/2017  . Essential hypertension, benign 05/29/2017  . Encounter for health maintenance examination in adult 07/23/2015  . 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 Impaired fasting blood sugar 07/23/2015  . Obesity  07/23/2015  . LVH (left ventricular hypertrophy) 07/23/2015  . Family history of premature CAD 07/23/2015  . Family history of diabetes mellitus 07/23/2015   Victor Jensen, Victor Jensen 05/20/18 9:37 AM   [facility] [address] [place], [place], [address] Phone: [phone]   Fax:  [phone]  Name: Victor Jensen MRN: [id number] Date of Birth: 10/30/1985

## 2018-05-20 NOTE — Patient Instructions (Signed)
Access Code: [email or web address]  URL: [email or web address]  Date: 05/20/2018  Prepared by: Army Fossa   Exercises  Seated Ankle Eversion with Resistance - 10 reps - 3 sets - 1x daily - 7x weekly  Seated Figure 4 Ankle Inversion with Resistance - 10 reps - 3 sets - 1x daily - 7x weekly  Ankle Dorsiflexion with Resistance - 10 reps - 3 sets - 1x daily - 7x weekly

## 2018-05-22 ENCOUNTER — Ambulatory Visit: Payer: 59 | Admitting: Physical Therapy

## 2018-05-22 ENCOUNTER — Encounter: Payer: Self-pay | Admitting: Physical Therapy

## 2018-05-22 DIAGNOSIS — R2689 Other abnormalities of gait and mobility: Secondary | ICD-10-CM

## 2018-05-22 DIAGNOSIS — M25571 Pain in right ankle and joints of right foot: Secondary | ICD-10-CM | POA: Diagnosis not present

## 2018-05-22 DIAGNOSIS — M25671 Stiffness of right ankle, not elsewhere classified: Secondary | ICD-10-CM

## 2018-05-22 DIAGNOSIS — M6281 Muscle weakness (generalized): Secondary | ICD-10-CM

## 2018-05-22 MED FILL — AMLODIPINE BESYLATE 10 MG T: 10 | 30 days supply | Qty: 30 | Fill #3

## 2018-05-22 NOTE — Therapy (Signed)
[facility] [address] [place], [place], [address] Phone: [phone]   Fax:  [phone]  Physical Therapy Treatment I connected with  Victor Jensen on 05/22/18 by a video enabled telemedicine application and verified that I am speaking with the correct person using two identifiers.   I discussed the limitations of evaluation and management by telemedicine. The patient expressed understanding and agreed to proceed.    Patient Details  Name: Victor Jensen MRN: [id number] Date of Birth: 04-27-1985 Referring Provider (PT): [name]   Encounter Date: 05/22/2018  PT End of Session - 05/22/18 0859    Visit Number  8    Number of Visits  16    Date for PT Re-Evaluation  06/06/18    Authorization Type  [facility]  $20 Copay . No Auth required.   Telehealth- no copay, no visit limit    PT Start Time  0900    PT Stop Time  0941    PT Time Calculation (min)  41 min    Activity Tolerance  Patient tolerated treatment well    Behavior During Therapy  WFL for tasks assessed/performed       Past Medical History:  Diagnosis Date  . Allergic rhinitis   . Allergy   . Family history of premature CAD    father, died MI in early 41s  . H/O cardiovascular stress test 01/2015   LVH, EF 65-70%, but other imaging advised, Dr. Antoine Poche  . History of MRI of chest    cardiac MRI.  Concentric LVH, mildly dilated LV. Dr. Antoine Poche  . History of sleep disturbance 04/2015   equivocal sleep study 04/2015  . Obesity     Past Surgical History:  Procedure Laterality Date  . NO PAST SURGERIES  06/2015  . None      There were no vitals filed for this visit.  Subjective Assessment - 05/22/18 0901    Subjective  Feeling great. Not using crutches at all. My knee is still really hurting so I have been resting.     Patient Stated Goals  return to gym, to walk normal, return to work.  Driving a forklift/     Currently in Pain?  No/denies                        [facility] Adult PT Treatment/Exercise - 05/22/18 0001      Ankle Exercises: Standing   SLS  using Lt as kick stand    Heel Raises  Other (comment)   bil raise with single lower   Other Standing Ankle Exercises  a/p weight shift in step length; lateral weight shift wide stance; with rotation, foot planted; step pivot lateral to Rt      Ankle Exercises: Stretches   Soleus Stretch  2 reps;30 seconds    Gastroc Stretch  2 reps;30 seconds    Other Stretch  fwd lunge Rt foot planted      Ankle Exercises: Seated   Other Seated Ankle Exercises  red tband inversion, eversion    Other Seated Ankle Exercises  alternate tapping- endurance and coordination               PT Short Term Goals - 05/20/18 0829      PT SHORT TERM GOAL #1   Title  Pt will be I with initial HEP for continued strengthening and mobility in 4 weeks.      Status  Achieved  PT SHORT TERM GOAL #2   Title  R ankle DF AROM will improve from -11 to 10 degrees in order to stand without pain.     Baseline  5 PROM, 1 AROM   calf tightness    Status  On-going        PT Long Term Goals - 04/11/18 1601      PT LONG TERM GOAL #1   Title  R ankle PF strength will improve to 3+/5 in order to perform stairs pain free by   .     Baseline  R ankle PF MMT  2/5    Time  8    Period  Weeks    Status  New    Target Date  06/06/18      PT LONG TERM GOAL #2   Title  Pt will walk independently 200 feet without A.D. nor CAM boot with reciprocal gait without antalgia by 06/06/18.     Baseline  bilat crutches and cam boot with antalgia     Time  8    Period  Weeks    Status  New    Target Date  06/06/18      PT LONG TERM GOAL #3   Title  FOTO score will improve from 76% limited to <44% to demo improved function and mobility.     Baseline  FOT 76% limited    Time  8    Period  Weeks    Status  New    Target Date  06/06/18            Plan - 05/22/18 0944    Clinical Impression  Statement  Pt continues to have pain in Left knee after falling at home so standing exercises were limited today. Making good progress with gait independence and stability. Endurance alternating toe taps revealed lack of endurance. Seeing MD on Tuesday.     PT Treatment/Interventions  ADLs/Self Care Home Management;Cryotherapy;Ultrasound;Moist Heat;Iontophoresis 4mg /ml Dexamethasone;Electrical Stimulation;DME Instruction;Gait training;Stair training;Functional mobility training;Manual techniques;Neuromuscular re-education;Balance training;Therapeutic exercise;Therapeutic activities;Patient/family education;Orthotic Fit/Training;Passive range of motion;Taping    PT Next Visit Plan  review heel raises, continue stability    PT Home Exercise Plan  Ankle AROM: circles, alphabet, DF/PF, gastroc stretch. LAQ, SLR, hip abd, toe curls, arch lifts, gait pattern, stretches for low back; anterior tibial translation in seated; seated heel/toe, iso inversion, standing a/p weight shift; ankle tband 3-way, lateral stepping, corner turns, stride stance weight shift    Consulted and Agree with Plan of Care  Patient       Patient will benefit from skilled therapeutic intervention in order to improve the following deficits and impairments:  Abnormal gait, Pain, Increased muscle spasms, Decreased mobility, Decreased activity tolerance, Decreased endurance, Decreased range of motion, Decreased strength, Difficulty walking, Impaired flexibility, Increased edema, Decreased balance  Visit Diagnosis: Other abnormalities of gait and mobility  Muscle weakness (generalized)  Pain in right ankle and joints of right foot  Stiffness of right ankle, not elsewhere classified     Problem List Patient Active Problem List   Diagnosis Date Noted  . Right foot pain 03/12/2018  . Acute right ankle pain 03/12/2018  . Left foot pain 01/14/2018  . Vaccine counseling 07/10/2017  . Plantar fasciitis 07/10/2017  . Acne 07/10/2017   . Essential hypertension, benign 05/29/2017  . Encounter for health maintenance examination in adult 07/23/2015  . 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 Impaired fasting blood sugar 07/23/2015  . Obesity 07/23/2015  .  LVH (left ventricular hypertrophy) 07/23/2015  . Family history of premature CAD 07/23/2015  . Family history of diabetes mellitus 07/23/2015  Macklyn Glandon C. Alima Naser PT, DPT 05/22/18 9:51 AM   [facility] [address] [place], [place], [address] Phone: [phone]   Fax:  [phone]  Name: Victor Jensen MRN: [id number] Date of Birth: 05-11-1985

## 2018-05-27 ENCOUNTER — Encounter: Payer: Self-pay | Admitting: Physical Therapy

## 2018-05-27 ENCOUNTER — Ambulatory Visit: Payer: 59 | Attending: Sports Medicine | Admitting: Physical Therapy

## 2018-05-27 DIAGNOSIS — M25571 Pain in right ankle and joints of right foot: Secondary | ICD-10-CM | POA: Diagnosis not present

## 2018-05-27 DIAGNOSIS — M6281 Muscle weakness (generalized): Secondary | ICD-10-CM | POA: Diagnosis not present

## 2018-05-27 DIAGNOSIS — R2689 Other abnormalities of gait and mobility: Secondary | ICD-10-CM | POA: Diagnosis not present

## 2018-05-27 DIAGNOSIS — M25671 Stiffness of right ankle, not elsewhere classified: Secondary | ICD-10-CM | POA: Insufficient documentation

## 2018-05-27 NOTE — Therapy (Signed)
[facility] [address] [place], [place], [address] Phone: [phone]   Fax:  [phone]  Physical Therapy Treatment I connected with  Victor RoyalsJonathan D Jensen on 05/27/18 by a video enabled telemedicine application and verified that I am speaking with the correct person using two identifiers.   I discussed the limitations of evaluation and management by telemedicine. The patient expressed understanding and agreed to proceed.    Patient Details  Name: Victor Jensen MRN: [id number] Date of Birth: 10/03/1985 Referring Provider (PT): Asencion IslamStover, Titorya   Encounter Date: 05/27/2018  PT End of Session - 05/27/18 0901    Visit Number  9    Number of Visits  16    Date for PT Re-Evaluation  06/06/18    Authorization Type  [facility]  $20 Copay . No Auth required.   Telehealth- no copay, no visit limit    PT Start Time  0901    PT Stop Time  0945    PT Time Calculation (min)  44 min    Activity Tolerance  Patient tolerated treatment well    Behavior During Therapy  WFL for tasks assessed/performed       Past Medical History:  Diagnosis Date  . Allergic rhinitis   . Allergy   . Family history of premature CAD    father, died MI in early 3340s  . H/O cardiovascular stress test 01/2015   LVH, EF 65-70%, but other imaging advised, Dr. Antoine PocheHochrein  . History of MRI of chest    cardiac MRI.  Concentric LVH, mildly dilated LV. Dr. Antoine PocheHochrein  . History of sleep disturbance 04/2015   equivocal sleep study 04/2015  . Obesity     Past Surgical History:  Procedure Laterality Date  . NO PAST SURGERIES  06/2015  . None      There were no vitals filed for this visit.  Subjective Assessment - 05/27/18 0902    Subjective  Felt like I did a lot after last session but feeling good. My back hurts worse than anything.     Currently in Pain?  No/denies         [facility] PT Assessment - 05/27/18 0001      AROM   Right Ankle Dorsiflexion  6                    [facility] Adult PT Treatment/Exercise - 05/27/18 0001      Ankle Exercises: Seated   Ankle Circles/Pumps  Strengthening;Right   red tband PF, hold 3s, slow return- knee ext & knee flexed   Towel Inversion/Eversion  Other (comment)   red tband in [place]   [place] Pickup  toe curls into carpet- no marbles    BAPS  Sitting   simulated with pillow under foot- both directions   Other Seated Ankle Exercises  toe yoga    Other Seated Ankle Exercises  heel/toe taps with foot hovering      Ankle Exercises: Stretches   Soleus Stretch  30 seconds    Gastroc Stretch  30 seconds      Ankle Exercises: Standing   Heel Raises  Both;Other (comment)   mini heel raises, bilat            PT Education - 05/27/18 0957    Education Details  anatomy of ankle, orthotics & brace wear/support/etc...    Person(s) Educated  Patient    Methods  Explanation;Demonstration;Verbal cues    Comprehension  Verbalized understanding;Need further  instruction;Returned demonstration;Verbal cues required       PT Short Term Goals - 05/20/18 0829      PT SHORT TERM GOAL #1   Title  Pt will be I with initial HEP for continued strengthening and mobility in 4 weeks.      Status  Achieved      PT SHORT TERM GOAL #2   Title  R ankle DF AROM will improve from -11 to 10 degrees in order to stand without pain.     Baseline  5 PROM, 1 AROM   calf tightness    Status  On-going        PT Long Term Goals - 04/11/18 1601      PT LONG TERM GOAL #1   Title  R ankle PF strength will improve to 3+/5 in order to perform stairs pain free by   .     Baseline  R ankle PF MMT  2/5    Time  8    Period  Weeks    Status  New    Target Date  06/06/18      PT LONG TERM GOAL #2   Title  Pt will walk independently 200 feet without A.D. nor CAM boot with reciprocal gait without antalgia by 06/06/18.     Baseline  bilat crutches and cam boot with antalgia     Time  8    Period  Weeks    Status  New     Target Date  06/06/18      PT LONG TERM GOAL #3   Title  FOTO score will improve from 76% limited to <44% to demo improved function and mobility.     Baseline  FOT 76% limited    Time  8    Period  Weeks    Status  New    Target Date  06/06/18            Plan - 05/27/18 0955    Clinical Impression Statement  Continues to feel that his Right knee is going to hyperextend so we will add hamstring exercises to next visit. Being fitted for orthotic at MD office tomorrow. Improving coordination with fatigue noted. Will likely need to be re-evaluated for addition of more exercises/stretches to address back pain.     PT Treatment/Interventions  ADLs/Self Care Home Management;Cryotherapy;Ultrasound;Moist Heat;Iontophoresis /ml Dexamethasone;Electrical Stimulation;DME Instruction;Gait training;Stair training;Functional mobility training;Manual techniques;Neuromuscular re-education;Balance training;Therapeutic exercise;Therapeutic activities;Patient/family education;Orthotic Fit/Training;Passive range of motion;Taping    PT Next Visit Plan  hamstrings strengthening, consider adding core exercises    PT Home Exercise Plan  Ankle AROM: circles, alphabet, DF/PF, gastroc stretch. LAQ, SLR, hip abd, toe curls, arch lifts, gait pattern, stretches for low back; anterior tibial translation in seated; seated heel/toe, iso inversion, standing a/p weight shift; ankle tband 3-way, lateral stepping, corner turns, stride stance weight shift    Consulted and Agree with Plan of Care  Patient       Patient will benefit from skilled therapeutic intervention in order to improve the following deficits and impairments:  Abnormal gait, Pain, Increased muscle spasms, Decreased mobility, Decreased activity tolerance, Decreased endurance, Decreased range of motion, Decreased strength, Difficulty walking, Impaired flexibility, Increased edema, Decreased balance  Visit Diagnosis: Other abnormalities of gait and  mobility  Muscle weakness (generalized)  Pain in right ankle and joints of right foot  Stiffness of right ankle, not elsewhere classified     Problem List Patient Active Problem List   Diagnosis Date Noted  .  Right foot pain 03/12/2018  . Acute right ankle pain 03/12/2018  . Left foot pain 01/14/2018  . Vaccine counseling 07/10/2017  . Plantar fasciitis 07/10/2017  . Acne 07/10/2017  . Essential hypertension, benign 05/29/2017  . Encounter for health maintenance examination in adult 07/23/2015  . 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 Impaired fasting blood sugar 07/23/2015  . Obesity 07/23/2015  . LVH (left ventricular hypertrophy) 07/23/2015  . Family history of premature CAD 07/23/2015  . Family history of diabetes mellitus 07/23/2015    Madelynne Lasker C. Maddyn Lieurance PT, DPT 05/27/18 9:59 AM   [facility] [address] [place], [place], [address] Phone: [phone]   Fax:  [phone]  Name: Victor Jensen MRN: [id number] Date of Birth: 21-Mar-1985

## 2018-05-28 ENCOUNTER — Ambulatory Visit (INDEPENDENT_AMBULATORY_CARE_PROVIDER_SITE_OTHER): Payer: 59 | Admitting: Orthotics

## 2018-05-28 ENCOUNTER — Ambulatory Visit (INDEPENDENT_AMBULATORY_CARE_PROVIDER_SITE_OTHER): Payer: 59 | Admitting: Sports Medicine

## 2018-05-28 ENCOUNTER — Encounter: Payer: Self-pay | Admitting: Sports Medicine

## 2018-05-28 ENCOUNTER — Other Ambulatory Visit: Payer: Self-pay

## 2018-05-28 VITALS — Temp 97.7°F

## 2018-05-28 DIAGNOSIS — M659 Synovitis and tenosynovitis, unspecified: Secondary | ICD-10-CM | POA: Diagnosis not present

## 2018-05-28 DIAGNOSIS — M25471 Effusion, right ankle: Secondary | ICD-10-CM | POA: Diagnosis not present

## 2018-05-28 DIAGNOSIS — M2141 Flat foot [pes planus] (acquired), right foot: Secondary | ICD-10-CM

## 2018-05-28 DIAGNOSIS — M25571 Pain in right ankle and joints of right foot: Secondary | ICD-10-CM | POA: Diagnosis not present

## 2018-05-28 DIAGNOSIS — M79671 Pain in right foot: Secondary | ICD-10-CM | POA: Diagnosis not present

## 2018-05-28 DIAGNOSIS — M65979 Unspecified synovitis and tenosynovitis, unspecified ankle and foot: Secondary | ICD-10-CM

## 2018-05-28 DIAGNOSIS — S93401D Sprain of unspecified ligament of right ankle, subsequent encounter: Secondary | ICD-10-CM

## 2018-05-28 DIAGNOSIS — M722 Plantar fascial fibromatosis: Secondary | ICD-10-CM

## 2018-05-28 DIAGNOSIS — M2142 Flat foot [pes planus] (acquired), left foot: Secondary | ICD-10-CM | POA: Diagnosis not present

## 2018-05-28 NOTE — Patient Instructions (Signed)
For instructions on how to put on your Tri-Lock Ankle Brace, please visit [email or web address] 

## 2018-05-28 NOTE — Progress Notes (Signed)
Subjective: Victor Jensen is a 33 y.o. male patient who presents to office for evaluation of right ankle/foot pain. Patient reports that his pain seems to be doing much better with PT now has transition to tennis shoe but is concerned about returning to work and re-injury. Denies nausea, vomiting, fever, chills or consitutional symptoms. Patient is also here for casting for orthotics. Patient also admits that he is having back pain, since starting PT back pain has gotten a little better. No other issues noted.  Patient Active Problem List   Diagnosis Date Noted  . Right foot pain 03/12/2018  . Acute right ankle pain 03/12/2018  . Left foot pain 01/14/2018  . Vaccine counseling 07/10/2017  . Plantar fasciitis 07/10/2017  . Acne 07/10/2017  . Essential hypertension, benign 05/29/2017  . Encounter for health maintenance examination in adult 07/23/2015  . 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 Impaired fasting blood sugar 07/23/2015  . Obesity 07/23/2015  . LVH (left ventricular hypertrophy) 07/23/2015  . Family history of premature CAD 07/23/2015  . Family history of diabetes mellitus 07/23/2015    Current Outpatient Medications on File Prior to Visit  Medication Sig Dispense Refill  . allopurinol (ZYLOPRIM) 300 MG tablet Take 1 tablet (300 mg total) by mouth daily. 90 tablet 3  . amLODipine (NORVASC) 10 MG tablet Take 1 tablet (10 mg total) by mouth daily. 30 tablet 3  . cyclobenzaprine (FLEXERIL) 10 MG tablet Take 1 tablet (10 mg total) by mouth 3 (three) times daily as needed for muscle spasms. 30 tablet 0  . diclofenac sodium (VOLTAREN) 1 % GEL Apply 2 g topically 2 (two) times daily as needed (apply to [facility]). Refills with PCP ONLY 100 g 0  . ibuprofen (ADVIL,MOTRIN) 800 MG tablet Take 800 mg by mouth every 8 (eight) hours as needed.    . traMADol (ULTRAM) 50 MG tablet Take 1 tablet (50 mg total) by mouth every 8 (eight) hours as needed. 30 tablet 0   No current  facility-administered medications on file prior to visit.     No Known Allergies  Objective:  General: Alert and oriented x3 in no acute distress  Dermatology: Intact, no breaks in skin, no signs of infection. No callus. Nails WNL.   Vascular: Minimal edema at right ankle, CFT intact in all digits. Temperature gradient within normal limits.  Neurology: Johney Maine sensation intact via light touch via light touch at exposed digits   Musculoskeletal: + minimal tenderness with palpation at medial ankle on right at PT tendon course and to medial malleolus. Mild subjective shooting with end ankle range of motion with tightness of calf on right, pain worse with loading on forefoot feels like 1st met base is going to snap with weakness on right, can not tolerated full weight on midfoot.   Assessment and Plan: Problem List Items Addressed This Visit      Musculoskeletal and Integument   Plantar fasciitis    Other Visit Diagnoses    Tenosynovitis of ankle    -  Primary   Sprain of right ankle, unspecified ligament, subsequent encounter       Pain and swelling of right ankle       Pain in right foot       Pes planus of both feet          -Complete examination performed -Discussed with patient nature of sprain injury and tenosynovitis and possibility of re-injury if his foot is not protected and  strengthened as he gains mobility and uses his foot more -Continue with powersteps and trilock as dispensed this visit until custom insoles are received   -Continue with PT with progression to patient's tolerance and focuse on strengthening especially at TA and PT tendon insertions at 1st met base -Patient to return to work on Monday 06-03-18 and is limited to <40lbs pushing, pulling, lifting and sitting PRN and must wear tennis shoe and trilock brace as Rx; Advised patient to have any other work paperwork faxed to our office to complete on his behalf and to discuss his progress and restrictions with his  supervisor and HR department as well -Patient was casted by Liliane Channel today for custom insoles and advised to use brace until insoles arrived -Advised patient to follow up with ortho about back pain -Patient to return to office in 2 weeks for re-evaluation or sooner if condition worsens.  Landis Martins, DPM

## 2018-05-29 ENCOUNTER — Ambulatory Visit: Payer: 59 | Admitting: Physical Therapy

## 2018-05-29 ENCOUNTER — Encounter: Payer: Self-pay | Admitting: Physical Therapy

## 2018-05-29 DIAGNOSIS — R2689 Other abnormalities of gait and mobility: Secondary | ICD-10-CM | POA: Diagnosis not present

## 2018-05-29 DIAGNOSIS — M25671 Stiffness of right ankle, not elsewhere classified: Secondary | ICD-10-CM

## 2018-05-29 DIAGNOSIS — M6281 Muscle weakness (generalized): Secondary | ICD-10-CM

## 2018-05-29 DIAGNOSIS — M25571 Pain in right ankle and joints of right foot: Secondary | ICD-10-CM

## 2018-05-29 NOTE — Patient Instructions (Signed)
Access Code: [email or web address]  URL: [email or web address]  Date: 05/29/2018  Prepared by: Army Fossa   Exercises  Sidelying Bent Knee Lift at 45 Degrees - 30 reps - 1 sets - 1x daily - 7x weekly  Prone Hip Extension with Bent Knee - 20 reps - 1 sets - 1x daily - 7x weekly  Sidelying Knee Flexion and Extension in Abduction - 20 reps - 2 sets - 1x daily - 7x weekly  Sidelying Bent Knee Hip Flexion - 20 reps - 2 sets - 1x daily - 7x weekly

## 2018-05-29 NOTE — Therapy (Signed)
[facility] Outpatient Rehabilitation [facility] [address] [place], [place], [address] Phone: [phone]   Fax:  [phone]  Physical Therapy Treatment I connected with  [name] on 05/29/18 by a video enabled telemedicine application and verified that I am speaking with the correct person using two identifiers.   I discussed the limitations of evaluation and management by telemedicine. The patient expressed understanding and agreed to proceed.    Patient Details  Name: Victor Jensen MRN: [id number] Date of Birth: 1985/11/16 Referring Provider (PT): [name]   Encounter Date: 05/29/2018  PT End of Session - 05/29/18 0943    Visit Number  10    Number of Visits  16    Date for PT Re-Evaluation  06/06/18    Authorization Type  [facility]  $20 Copay . No Auth required.   Telehealth- no copay, no visit limit    PT Start Time  0901    PT Stop Time  0941    PT Time Calculation (min)  40 min    Activity Tolerance  Patient tolerated treatment well    Behavior During Therapy  [facility] for tasks assessed/performed       Past Medical History:  Diagnosis Date  . Allergic rhinitis   . Allergy   . Family history of premature CAD    father, died MI in early 81s  . H/O cardiovascular stress test 01/2015   LVH, EF 65-70%, but other imaging advised, Dr. Antoine Poche  . History of MRI of chest    cardiac MRI.  Concentric LVH, mildly dilated LV. Dr. Antoine Poche  . History of sleep disturbance 04/2015   equivocal sleep study 04/2015  . Obesity     Past Surgical History:  Procedure Laterality Date  . NO PAST SURGERIES  06/2015  . None      There were no vitals filed for this visit.  Subjective Assessment - 05/29/18 0902    Subjective  I like the trilock brace a lot.     Patient Stated Goals  return to gym, to walk normal, return to work.  Driving a forklift/     Currently in Pain?  No/denies                       [facility] Adult PT  Treatment/Exercise - 05/29/18 0001      Knee/Hip Exercises: Seated   Other Seated Knee/Hip Exercises  isometric HS curl seated on couch- 90 deg, with slight knee bend    Other Seated Knee/Hip Exercises  isometric PF with knee slightly flexed    Sit to [employer]  Other (comment)   attempted single leg stand     Knee/Hip Exercises: Sidelying   Hip ABduction Limitations  hip abd with knee flex/ext    Other Sidelying Knee/Hip Exercises  abd ball behind knee; +hip flx/ext      Knee/Hip Exercises: Prone   Hip Extension Limitations  with knee flexed    Other Prone Exercises  quick HS curls with iso holds 10x10    Other Prone Exercises  hip ext woth pillow behind knee x30               PT Short Term Goals - 05/20/18 0829      PT SHORT TERM GOAL #1   Title  Pt will be I with initial HEP for continued strengthening and mobility in 4 weeks.      Status  Achieved      PT  SHORT TERM GOAL #2   Title  R ankle DF AROM will improve from -11 to 10 degrees in order to stand without pain.     Baseline  5 PROM, 1 AROM   calf tightness    Status  On-going        PT Long Term Goals - 04/11/18 1601      PT LONG TERM GOAL #1   Title  R ankle PF strength will improve to 3+/5 in order to perform stairs pain free by   .     Baseline  R ankle PF MMT  2/5    Time  8    Period  Weeks    Status  New    Target Date  06/06/18      PT LONG TERM GOAL #2   Title  Pt will walk independently 200 feet without A.D. nor CAM boot with reciprocal gait without antalgia by 06/06/18.     Baseline  bilat crutches and cam boot with antalgia     Time  8    Period  Weeks    Status  New    Target Date  06/06/18      PT LONG TERM GOAL #3   Title  FOTO score will improve from 76% limited to <44% to demo improved function and mobility.     Baseline  FOT 76% limited    Time  8    Period  Weeks    Status  New    Target Date  06/06/18            Plan - 05/29/18 0948    Clinical Impression Statement   Focused on hamstring activation today but in [place] as pt knees are not tolerating CKC well. Still having significant difficulty with Lt knee. Will need ERO next week. returning to work Monday night.     PT Treatment/Interventions  ADLs/Self Care Home Management;Cryotherapy;Ultrasound;Moist Heat;Iontophoresis 4mg /ml Dexamethasone;Electrical Stimulation;DME Instruction;Gait training;Stair training;Functional mobility training;Manual techniques;Neuromuscular re-education;Balance training;Therapeutic exercise;Therapeutic activities;Patient/family education;Orthotic Fit/Training;Passive range of motion;Taping    PT Next Visit Plan  revisit CKC exercises, discuss HEP    PT Home Exercise Plan  Ankle AROM: circles, alphabet, DF/PF, gastroc stretch. LAQ, SLR, hip abd, toe curls, arch lifts, gait pattern, stretches for low back; anterior tibial translation in seated; seated heel/toe, iso inversion, standing a/p weight shift; ankle tband 3-way, lateral stepping, corner turns, stride stance weight shift; prone hip ext knee flexecd; sidelying hip abd+hip flx/ext+knee flx/ext    Consulted and Agree with Plan of Care  Patient       Patient will benefit from skilled therapeutic intervention in order to improve the following deficits and impairments:  Abnormal gait, Pain, Increased muscle spasms, Decreased mobility, Decreased activity tolerance, Decreased endurance, Decreased range of motion, Decreased strength, Difficulty walking, Impaired flexibility, Increased edema, Decreased balance  Visit Diagnosis: Other abnormalities of gait and mobility  Muscle weakness (generalized)  Pain in right ankle and joints of right foot  Stiffness of right ankle, not elsewhere classified     Problem List Patient Active Problem List   Diagnosis Date Noted  . Right foot pain 03/12/2018  . Acute right ankle pain 03/12/2018  . Left foot pain 01/14/2018  . Vaccine counseling 07/10/2017  . Plantar fasciitis 07/10/2017  . Acne  07/10/2017  . Essential hypertension, benign 05/29/2017  . Encounter for health maintenance examination in adult 07/23/2015  . 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 Impaired fasting blood sugar 07/23/2015  .  Obesity 07/23/2015  . LVH (left ventricular hypertrophy) 07/23/2015  . Family history of premature CAD 07/23/2015  . Family history of diabetes mellitus 07/23/2015   Deloros Beretta C. Shylyn Younce PT, DPT 05/29/18 9:53 AM   [facility] [address] [place], [place], [address] Phone: [phone]   Fax:  [phone]  Name: Victor Jensen MRN: [id number] Date of Birth: 26-Apr-1985

## 2018-05-30 NOTE — Progress Notes (Signed)
Patient came into today to be cast for Custom Foot Orthotics. Upon recommendation of Dr. Marylene Land Patient presents with tenosynovitis of ankle Goals are rear foot stability, long archu support.  Plan vendor

## 2018-06-03 ENCOUNTER — Ambulatory Visit: Payer: 59 | Admitting: Physical Therapy

## 2018-06-05 ENCOUNTER — Ambulatory Visit: Payer: 59 | Admitting: Physical Therapy

## 2018-06-05 ENCOUNTER — Encounter: Payer: Self-pay | Admitting: Physical Therapy

## 2018-06-05 DIAGNOSIS — R2689 Other abnormalities of gait and mobility: Secondary | ICD-10-CM | POA: Diagnosis not present

## 2018-06-05 DIAGNOSIS — M6281 Muscle weakness (generalized): Secondary | ICD-10-CM | POA: Diagnosis not present

## 2018-06-05 DIAGNOSIS — M25671 Stiffness of right ankle, not elsewhere classified: Secondary | ICD-10-CM

## 2018-06-05 DIAGNOSIS — M25571 Pain in right ankle and joints of right foot: Secondary | ICD-10-CM | POA: Diagnosis not present

## 2018-06-05 NOTE — Therapy (Signed)
[facility] Outpatient Rehabilitation [facility] [address] [place], [place], [address] Phone: [phone]   Fax:  [phone]  Physical Therapy Treatment I connected with  Victor Jensen on 06/05/18 by a video enabled telemedicine application and verified that I am speaking with the correct person using two identifiers.   I discussed the limitations of evaluation and management by telemedicine. The patient expressed understanding and agreed to proceed.    Patient Details  Name: Victor Jensen MRN: [id number] Date of Birth: 1985-04-16 Referring Provider (PT): [name]   Encounter Date: 06/05/2018  PT End of Session - 06/05/18 1035    Visit Number  11    Number of Visits  16    Date for PT Re-Evaluation  06/06/18    Authorization Type  [facility]  $20 Copay . No Auth required.   Telehealth- no copay, no visit limit    PT Start Time  1035    PT Stop Time  1055    PT Time Calculation (min)  20 min    Activity Tolerance  Patient limited by pain    Behavior During Therapy  [facility] for tasks assessed/performed       Past Medical History:  Diagnosis Date  . Allergic rhinitis   . Allergy   . Family history of premature CAD    father, died MI in early 38s  . H/O cardiovascular stress test 01/2015   LVH, EF 65-70%, but other imaging advised, Dr. Antoine Poche  . History of MRI of chest    cardiac MRI.  Concentric LVH, mildly dilated LV. Dr. Antoine Poche  . History of sleep disturbance 04/2015   equivocal sleep study 04/2015  . Obesity     Past Surgical History:  Procedure Laterality Date  . NO PAST SURGERIES  06/2015  . None      There were no vitals filed for this visit.  Subjective Assessment - 06/05/18 1036    Subjective  Return to work was tough but I made it through. I have worked 2 shifts since last visit. Works again [employer]. both of my legs are swollen below the knees and hurt bad. Grilled for 3 hr on Mothers day resulting in swelling and sharp pain.  improved after resting.     Patient Stated Goals  return to gym, to walk normal, return to work.  Driving a forklift/     Currently in Pain?  Yes    Pain Score  8     Pain Location  Ankle    Pain Orientation  Right    Pain Descriptors / Indicators  --   it just hurts a lot        [facility] PT Assessment - 06/05/18 0001      Assessment   Medical Diagnosis  R ankle sprain    Referring Provider (PT)  Marylene Land, [place]    Onset Date/Surgical Date  03/09/18    Hand Dominance  Right      Precautions   Precautions  None      Restrictions   Other Position/Activity Restrictions  avoid lifting >40lb      Home Environment   Living Environment  Private residence    Living Arrangements  Other (Comment)   family     Prior Function   Level of Independence  Independent    Vocation  Full time employment    English as a second language teacher for [employer]  Going to the gym  Cognition   Overall Cognitive Status  Within Functional Limits for tasks assessed      AROM   Overall AROM Comments  to be updated at next visit                             PT Short Term Goals - 05/20/18 0829      PT SHORT TERM GOAL #1   Title  Pt will be I with initial HEP for continued strengthening and mobility in 4 weeks.      Status  Achieved      PT SHORT TERM GOAL #2   Title  R ankle DF AROM will improve from -11 to 10 degrees in order to stand without pain.     Baseline  5 PROM, 1 AROM   calf tightness    Status  On-going        PT Long Term Goals - 04/11/18 1601      PT LONG TERM GOAL #1   Title  R ankle PF strength will improve to 3+/5 in order to perform stairs pain free by   .     Baseline  R ankle PF MMT  2/5    Time  8    Period  Weeks    Status  New    Target Date  06/06/18      PT LONG TERM GOAL #2   Title  Pt will walk independently 200 feet without A.D. nor CAM boot with reciprocal gait without antalgia by 06/06/18.     Baseline  bilat crutches  and cam boot with antalgia     Time  8    Period  Weeks    Status  New    Target Date  06/06/18      PT LONG TERM GOAL #3   Title  FOTO score will improve from 76% limited to <44% to demo improved function and mobility.     Baseline  FOT 76% limited    Time  8    Period  Weeks    Status  New    Target Date  06/06/18            Plan - 06/05/18 1107    Clinical Impression Statement  Pt reports 8.5/10 pain since returning to work this week with swelling and pain below his knees bilaterally. Denied redness, warmth or severe pain to calf squeeze; no h/o clots. Reports taking limited breaks at work and we discussed the importance of short, frequent rest breaks. Asked that his manager contact me with any questions or concerns regarding limitaitons. He is going to bed after this session so I asked him to rest his ankle, ice and do gentle AROM to allow ankle to calm down before going to work tonight. Will change pt to in clinic visits and goals will be assessed at next visit.     PT Treatment/Interventions  ADLs/Self Care Home Management;Cryotherapy;Ultrasound;Moist Heat;Iontophoresis 4mg /ml Dexamethasone;Electrical Stimulation;DME Instruction;Gait training;Stair training;Functional mobility training;Manual techniques;Neuromuscular re-education;Balance training;Therapeutic exercise;Therapeutic activities;Patient/family education;Orthotic Fit/Training;Passive range of motion;Taping;Dry needling    PT Next Visit Plan  ERO-extend POC (has been doing telehealth, would like at least 1/week in clinic). consider DN    PT Home Exercise Plan  Ankle AROM: circles, alphabet, DF/PF, gastroc stretch. LAQ, SLR, hip abd, toe curls, arch lifts, gait pattern, stretches for low back; anterior tibial translation in seated; seated heel/toe, iso inversion, standing a/p weight shift; ankle tband 3-way,  lateral stepping, corner turns, stride stance weight shift; prone hip ext knee flexecd; sidelying hip abd+hip  flx/ext+knee flx/ext    Consulted and Agree with Plan of Care  Patient       Patient will benefit from skilled therapeutic intervention in order to improve the following deficits and impairments:  Abnormal gait, Pain, Increased muscle spasms, Decreased mobility, Decreased activity tolerance, Decreased endurance, Decreased range of motion, Decreased strength, Difficulty walking, Impaired flexibility, Increased edema, Decreased balance  Visit Diagnosis: Other abnormalities of gait and mobility  Muscle weakness (generalized)  Pain in right ankle and joints of right foot  Stiffness of right ankle, not elsewhere classified     Problem List Patient Active Problem List   Diagnosis Date Noted  . Right foot pain 03/12/2018  . Acute right ankle pain 03/12/2018  . Left foot pain 01/14/2018  . Vaccine counseling 07/10/2017  . Plantar fasciitis 07/10/2017  . Acne 07/10/2017  . Essential hypertension, benign 05/29/2017  . Encounter for health maintenance examination in adult 07/23/2015  . 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 Impaired fasting blood sugar 07/23/2015  . Obesity 07/23/2015  . LVH (left ventricular hypertrophy) 07/23/2015  . Family history of premature CAD 07/23/2015  . Family history of diabetes mellitus 07/23/2015   Dorlisa Savino C. Zuri Bradway PT, DPT 06/05/18 11:15 AM   [facility] Outpatient Rehabilitation [facility] [address] [place], [place], [address] Phone: [phone]   Fax:  [phone]  Name: Victor Jensen MRN: [id number] Date of Birth: 1985-07-02

## 2018-06-06 ENCOUNTER — Encounter: Payer: Self-pay | Admitting: Sports Medicine

## 2018-06-07 ENCOUNTER — Ambulatory Visit: Payer: 59 | Admitting: Physical Therapy

## 2018-06-07 ENCOUNTER — Encounter: Payer: Self-pay | Admitting: Sports Medicine

## 2018-06-07 ENCOUNTER — Encounter: Payer: Self-pay | Admitting: Physical Therapy

## 2018-06-07 DIAGNOSIS — M6281 Muscle weakness (generalized): Secondary | ICD-10-CM

## 2018-06-07 DIAGNOSIS — M25671 Stiffness of right ankle, not elsewhere classified: Secondary | ICD-10-CM | POA: Diagnosis not present

## 2018-06-07 DIAGNOSIS — R2689 Other abnormalities of gait and mobility: Secondary | ICD-10-CM

## 2018-06-07 DIAGNOSIS — M25571 Pain in right ankle and joints of right foot: Secondary | ICD-10-CM | POA: Diagnosis not present

## 2018-06-07 NOTE — Therapy (Signed)
[facility] [address] [place], [place], [address] Phone: [phone]   Fax:  [phone]  Physical Therapy Treatment/ERO I connected with  Victor Jensen on 06/07/18 by a video enabled telemedicine application and verified that I am speaking with the correct person using two identifiers.   I discussed the limitations of evaluation and management by telemedicine. The patient expressed understanding and agreed to proceed.    Patient Details  Name: Victor Jensen MRN: [id number] Date of Birth: Feb 03, 1985 Referring Provider (PT): Asencion IslamStover, Titorya   Encounter Date: 06/07/2018  PT End of Session - 06/07/18 0751    Visit Number  12    Number of Visits  24    Date for PT Re-Evaluation  07/19/18    Authorization Type  [facility]  $[facility] Cop[facility] . No Auth required.   Telehealth- no copay, no visit limit    PT Start Time  0751    PT Stop Time  0824    PT Time Calculation (min)  33 min    Activity Tolerance  Patient tolerated treatment well    Behavior During Therapy  [facility] for tasks assessed/performed       Past Medical History:  Diagnosis Date  . Allergic rhinitis   . Allergy   . Family history of premature CAD    father, died MI in early 5940s  . H/O cardiovascular stress test 01/2015   LVH, EF 65-70%, but other imaging advised, Dr. Antoine PocheHochrein  . History of MRI of chest    cardiac MRI.  Concentric LVH, mildly dilated LV. Dr. Antoine PocheHochrein  . History of sleep disturbance 04/2015   equivocal sleep study 04/2015  . Obesity     Past Surgical History:  Procedure Laterality Date  . NO PAST SURGERIES  06/2015  . None      There were no vitals filed for this visit.  Subjective Assessment - 06/07/18 0801    Subjective  Medial ankle is sharp/stabbing. Out of work right now due to restrictions but wants to return. Was walking 35 min comfortably before returning to work now is uncomfortale immediately. Back is feeling much better but still hurts.  More noted "snapping" of knee with joint line pain medial to patella.     Pertinent History  HTN, R ankle chronic ATFL tear. MRI indicated Talar dome lesion and posterior tib tenosynovitis.    How long can you sit comfortably?  not limited     How long can you walk comfortably?  35 min with tennis shoes and brace    Patient Stated Goals  return to gym, to walk normal, return to work.  Driving a forklift/     Currently in Pain?  Yes    Pain Score  2     Pain Location  Ankle    Pain Orientation  Right    Pain Descriptors / Indicators  Sharp    Aggravating Factors   weight bearing         [facility] PT Assessment - 06/07/18 0001      Assessment   Medical Diagnosis  R ankle sprain    Referring Provider (PT)  Marylene LandStover, Titorya    Onset Date/Surgical Date  03/09/18    Hand Dominance  Right      Precautions   Precautions  None      Restrictions   Other Position/Activity Restrictions  avoid lifting >40lb      Prior Function   Level of Independence  Independent  Vocation  Full time employment    English as a second language teacher for [employer]  Going to the gym       Cognition   Overall Cognitive Status  Within Functional Limits for tasks assessed      Posture/Postural Control   Posture Comments  pes planus      AROM   Right Ankle Dorsiflexion  8      Strength   Right Ankle Plantar Flexion  3-/5   unable to lift heel against gravity in SLS     Palpation   Palpation comment  reports sharp pain to palpation at distal post tib tendon                           PT Education - 06/07/18 0907    Education Details  goals, work environment, support wear, ice, HEP, POC    Person(s) Educated  Patient    Methods  Explanation    Comprehension  Verbalized understanding;Need further instruction       PT Short Term Goals - 06/07/18 0755      PT SHORT TERM GOAL #1   Title  Pt will be I with initial HEP for continued strengthening and mobility in 4 weeks.       Status  Achieved      PT SHORT TERM GOAL #2   Title  R ankle DF AROM will improve from -11 to 10 degrees in order to stand without pain.     Baseline  8 AROM        PT Long Term Goals - 06/07/18 0755      PT LONG TERM GOAL #1   Title  R ankle PF strength will improve to 3+/5 in order to perform stairs pain free by   .     Baseline  R ankle PF MMT  3-/5 unable to lift heel against gravity in SLS    Status  On-going      PT LONG TERM GOAL #2   Title  Pt will walk independently 200 feet without A.D. nor CAM boot with reciprocal gait without antalgia by 06/06/18.     Status  Achieved      PT LONG TERM GOAL #3   Title  FOTO score will improve from 76% limited to <44% to demo improved function and mobility.     Status  Deferred      PT LONG TERM GOAL #4   Title  Pt will be able to ambulate comfortably for at least 1 hour    Baseline  max 35 min    Status  New            Plan - 06/07/18 2202    Clinical Impression Statement  Decreased pain from last session after resting and a lot of ice. Pt was doing very well before returning to work but was lacking endurance necessary to return to PLOF. PT POC will be extended to address this and assist pt in return to work. Pt reports he is seeing dr Marylene Land Tuesday AM and will be receiving custom inserts. Advised he focus on the earlier exercises- OKC, gentle stretching, standing weight shift with shoes on.     PT Frequency  2x / week    PT Duration  6 weeks    PT Treatment/Interventions  ADLs/Self Care Home Management;Cryotherapy;Ultrasound;Moist Heat;Iontophoresis 4mg /ml Dexamethasone;Electrical Stimulation;DME Instruction;Gait training;Stair training;Functional mobility training;Manual techniques;Neuromuscular re-education;Balance training;Therapeutic exercise;Therapeutic activities;Patient/family  education;Orthotic Fit/Training;Passive range of motion;Taping;Dry needling    PT Next Visit Plan  check anterior tib, Resisted PF,  inversion/eversion strengthening, HS eccentric strengthening in CKC if tolerated, US/ionto? to post tib tendon    PT Home Exercise Plan  Ankle AROM: circles, alphabet, DF/PF, gastroc stretch. LAQ, SLR, hip abd, toe curls, arch lifts, gait pattern, stretches for low back; anterior tibial translation in seated; seated heel/toe, iso inversion, standing a/p weight shift; ankle tband 3-way, lateral stepping, corner turns, stride stance weight shift; prone hip ext knee flexecd; sidelying hip abd+hip flx/ext+knee flx/ext    Consulted and Agree with Plan of Care  Patient       Patient will benefit from skilled therapeutic intervention in order to improve the following deficits and impairments:  Abnormal gait, Pain, Increased muscle spasms, Decreased mobility, Decreased activity tolerance, Decreased endurance, Decreased range of motion, Decreased strength, Difficulty walking, Impaired flexibility, Increased edema, Decreased balance  Visit Diagnosis: Other abnormalities of gait and mobility - Plan: PT plan of care cert/re-cert  Muscle weakness (generalized) - Plan: PT plan of care cert/re-cert  Stiffness of right ankle, not elsewhere classified - Plan: PT plan of care cert/re-cert  Pain in right ankle and joints of right foot - Plan: PT plan of care cert/re-cert     Problem List Patient Active Problem List   Diagnosis Date Noted  . Right foot pain 03/12/2018  . Acute right ankle pain 03/12/2018  . Left foot pain 01/14/2018  . Vaccine counseling 07/10/2017  . Plantar fasciitis 07/10/2017  . Acne 07/10/2017  . Essential hypertension, benign 05/29/2017  . Encounter for health maintenance examination in adult 07/23/2015  . 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 Impaired fasting blood sugar 07/23/2015  . Obesity 07/23/2015  . LVH (left ventricular hypertrophy) 07/23/2015  . Family history of premature CAD 07/23/2015  . Family history of diabetes mellitus 07/23/2015    Victor Jensen  PT, DPT 06/07/18 9:12 AM   [facility] [address] [place], [place], [address] Phone: [phone]   Fax:  [phone]  Name: Victor Jensen MRN: [id number] Date of Birth: 04-16-1985

## 2018-06-07 NOTE — Progress Notes (Signed)
Sent note to Mychart for patient Dr. Kathie Rhodes

## 2018-06-11 ENCOUNTER — Encounter: Payer: 59 | Admitting: Orthotics

## 2018-06-11 ENCOUNTER — Ambulatory Visit: Payer: 59 | Admitting: Sports Medicine

## 2018-06-19 ENCOUNTER — Other Ambulatory Visit: Payer: Self-pay

## 2018-06-19 ENCOUNTER — Encounter: Payer: Self-pay | Admitting: Physical Therapy

## 2018-06-19 ENCOUNTER — Ambulatory Visit: Payer: 59 | Admitting: Physical Therapy

## 2018-06-19 DIAGNOSIS — M25571 Pain in right ankle and joints of right foot: Secondary | ICD-10-CM | POA: Diagnosis not present

## 2018-06-19 DIAGNOSIS — M25671 Stiffness of right ankle, not elsewhere classified: Secondary | ICD-10-CM

## 2018-06-19 DIAGNOSIS — R2689 Other abnormalities of gait and mobility: Secondary | ICD-10-CM

## 2018-06-19 DIAGNOSIS — M6281 Muscle weakness (generalized): Secondary | ICD-10-CM | POA: Diagnosis not present

## 2018-06-19 NOTE — Therapy (Signed)
[facility] [address] [place], [place], [address] Phone: [phone]   Fax:  [phone]  Physical Therapy Treatment  Patient Details  Name: Victor Jensen MRN: [id number] Date of Birth: 1985/04/15 Referring Provider (PT): [name]   Encounter Date: 06/19/2018  PT End of Session - 06/19/18 1601    Visit Number  13    Number of Visits  24    Date for PT Re-Evaluation  07/19/18    Authorization Type  [facility]  $20 Copay . No Auth required.   Telehealth- no copay, no visit limit    PT Start Time  1502    PT Stop Time  1545    PT Time Calculation (min)  43 min    Activity Tolerance  Patient tolerated treatment well    Behavior During Therapy  WFL for tasks assessed/performed       Past Medical History:  Diagnosis Date  . Allergic rhinitis   . Allergy   . Family history of premature CAD    father, died MI in early 63s  . H/O cardiovascular stress test 01/2015   LVH, EF 65-70%, but other imaging advised, Dr. Antoine Poche  . History of MRI of chest    cardiac MRI.  Concentric LVH, mildly dilated LV. Dr. Antoine Poche  . History of sleep disturbance 04/2015   equivocal sleep study 04/2015  . Obesity     Past Surgical History:  Procedure Laterality Date  . NO PAST SURGERIES  06/2015  . None      There were no vitals filed for this visit.  Subjective Assessment - 06/19/18 1504    Subjective  Ankle is feeling ok today. Can comfortably walk at work for maybe an hour. It still swells but not as bad the first week.     Pertinent History  HTN, R ankle chronic ATFL tear. MRI indicated Talar dome lesion and posterior tib tenosynovitis.    Currently in Pain?  No/denies                       [facility] Adult PT Treatment/Exercise - 06/19/18 0001      Modalities   Modalities  Ultrasound      Ultrasound   Ultrasound Location  Rt distal post tib    Ultrasound Parameters  .5 w/cm2 pulsed    Ultrasound Goals  Pain      Manual Therapy   Manual Therapy  Soft tissue mobilization    Soft tissue mobilization  lateral gastroc, anterior tib    Passive ROM  DF controling inv/eversion      Ankle Exercises: Seated   Towel Inversion/Eversion  Other (comment)   isometric inversion towel bw 1st mets   Other Seated Ankle Exercises  AROM DF/PF straigt plane      Ankle Exercises: Supine   Other Supine Ankle Exercises  prone resisted inversion 3 ranges of PF/DF               PT Short Term Goals - 06/07/18 0755      PT SHORT TERM GOAL #1   Title  Pt will be I with initial HEP for continued strengthening and mobility in 4 weeks.      Status  Achieved      PT SHORT TERM GOAL #2   Title  R ankle DF AROM will improve from -11 to 10 degrees in order to stand without pain.     Baseline  8 AROM  PT Long Term Goals - 06/07/18 0755      PT LONG TERM GOAL #1   Title  R ankle PF strength will improve to 3+/5 in order to perform stairs pain free by   .     Baseline  R ankle PF MMT  3-/5 unable to lift heel against gravity in SLS    Status  On-going      PT LONG TERM GOAL #2   Title  Pt will walk independently 200 feet without A.D. nor CAM boot with reciprocal gait without antalgia by 06/06/18.     Status  Achieved      PT LONG TERM GOAL #3   Title  FOTO score will improve from 76% limited to <44% to demo improved function and mobility.     Status  Deferred      PT LONG TERM GOAL #4   Title  Pt will be able to ambulate comfortably for at least 1 hour    Baseline  max 35 min    Status  New            Plan - 06/19/18 1602    Clinical Impression Statement  Tendency toward eversion when attempting to DF. Manually able to DF in proper plane to approx neutral indicating poor mechanics in gait. Distal post tib TTP- utilized US in attempt to decrease pain. Asked pt to work on active DF & DF stretch in straight plane as well as isometric inversion for post tib strengthening. Is getting inserts on  Tuesday. Suspected compartment syndrome bilaterally due to pes planus.     PT Treatment/Interventions  ADLs/Self Care Home Management;Cryotherapy;Ultrasound;Moist Heat;Iontophoresis 4mg /ml Dexamethasone;Electrical Stimulation;DME Instruction;Gait training;Stair training;Functional mobility training;Manual techniques;Neuromuscular re-education;Balance training;Therapeutic exercise;Therapeutic activities;Patient/family education;Orthotic Fit/Training;Passive range of motion;Taping;Dry needling    PT Next Visit Plan  cont DF stretch, resisted inversion, eccentric gastroc- lengthening lateral, outcome of US?    PT Home Exercise Plan  Ankle AROM: circles, alphabet, DF/PF, gastroc stretch. LAQ, SLR, hip abd, toe curls, arch lifts, gait pattern, stretches for low back; anterior tibial translation in seated; seated heel/toe, iso inversion, standing a/p weight shift; ankle tband 3-way, lateral stepping, corner turns, stride stance weight shift; prone hip ext knee flexecd; sidelying hip abd+hip flx/ext+knee flx/ext    Consulted and Agree with Plan of Care  Patient       Patient will benefit from skilled therapeutic intervention in order to improve the following deficits and impairments:  Abnormal gait, Pain, Increased muscle spasms, Decreased mobility, Decreased activity tolerance, Decreased endurance, Decreased range of motion, Decreased strength, Difficulty walking, Impaired flexibility, Increased edema, Decreased balance  Visit Diagnosis: Other abnormalities of gait and mobility  Muscle weakness (generalized)  Stiffness of right ankle, not elsewhere classified  Pain in right ankle and joints of right foot     Problem List Patient Active Problem List   Diagnosis Date Noted  . Right foot pain 03/12/2018  . Acute right ankle pain 03/12/2018  . Left foot pain 01/14/2018  . Vaccine counseling 07/10/2017  . Plantar fasciitis 07/10/2017  . Acne 07/10/2017  . Essential hypertension, benign 05/29/2017   . Encounter for health maintenance examination in adult 07/23/2015  . 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 Impaired fasting blood sugar 07/23/2015  . Obesity 07/23/2015  . LVH (left ventricular hypertrophy) 07/23/2015  . Family history of premature CAD 07/23/2015  . Family history of diabetes mellitus 07/23/2015   Baraka Klatt C. Taleeyah Bora PT, DPT 06/19/18 4:09 PM   [facility] [address] [place], [place], [address] Phone: [phone]   Fax:  [phone]  Name: REINER LOEWEN MRN: [id number] Date of Birth: 05/19/1985

## 2018-06-25 ENCOUNTER — Other Ambulatory Visit: Payer: Self-pay

## 2018-06-25 ENCOUNTER — Ambulatory Visit: Payer: 59 | Admitting: Sports Medicine

## 2018-06-25 ENCOUNTER — Encounter: Payer: Self-pay | Admitting: Sports Medicine

## 2018-06-25 ENCOUNTER — Ambulatory Visit: Payer: 59 | Admitting: Orthotics

## 2018-06-25 VITALS — Temp 97.2°F

## 2018-06-25 DIAGNOSIS — M659 Synovitis and tenosynovitis, unspecified: Secondary | ICD-10-CM

## 2018-06-25 DIAGNOSIS — M2141 Flat foot [pes planus] (acquired), right foot: Secondary | ICD-10-CM

## 2018-06-25 DIAGNOSIS — M722 Plantar fascial fibromatosis: Secondary | ICD-10-CM

## 2018-06-25 DIAGNOSIS — M2142 Flat foot [pes planus] (acquired), left foot: Secondary | ICD-10-CM

## 2018-06-25 DIAGNOSIS — M79671 Pain in right foot: Secondary | ICD-10-CM

## 2018-06-25 DIAGNOSIS — S93401D Sprain of unspecified ligament of right ankle, subsequent encounter: Secondary | ICD-10-CM

## 2018-06-25 DIAGNOSIS — M25471 Effusion, right ankle: Secondary | ICD-10-CM

## 2018-06-25 DIAGNOSIS — M65979 Unspecified synovitis and tenosynovitis, unspecified ankle and foot: Secondary | ICD-10-CM

## 2018-06-25 DIAGNOSIS — M25571 Pain in right ankle and joints of right foot: Secondary | ICD-10-CM | POA: Diagnosis not present

## 2018-06-25 NOTE — Progress Notes (Signed)
Patient came in today to pick up custom made foot orthotics.  The goals were accomplished and the patient reported no dissatisfaction with said orthotics.  Patient was advised of breakin period and how to report any issues. 

## 2018-06-25 NOTE — Progress Notes (Signed)
Subjective: Victor Jensen is a 33 y.o. male patient who presents to office for evaluation of right ankle/foot pain. Patient reports that his pain get work by day 4-5 at work with swelling. Pain is worse at medial ankle by end of day and hopping around. Denies nausea, vomiting, fever, chills or consitutional symptoms. Patient is also here for dispensing of orthotics. No other issues noted.  Patient Active Problem List   Diagnosis Date Noted  . Right foot pain 03/12/2018  . Acute right ankle pain 03/12/2018  . Left foot pain 01/14/2018  . Vaccine counseling 07/10/2017  . Plantar fasciitis 07/10/2017  . Acne 07/10/2017  . Essential hypertension, benign 05/29/2017  . Encounter for health maintenance examination in adult 07/23/2015  . 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 Impaired fasting blood sugar 07/23/2015  . Obesity 07/23/2015  . LVH (left ventricular hypertrophy) 07/23/2015  . Family history of premature CAD 07/23/2015  . Family history of diabetes mellitus 07/23/2015    Current Outpatient Medications on File Prior to Visit  Medication Sig Dispense Refill  . allopurinol (ZYLOPRIM) 300 MG tablet Take 1 tablet (300 mg total) by mouth daily. 90 tablet 3  . amLODipine (NORVASC) 10 MG tablet Take 1 tablet (10 mg total) by mouth daily. 30 tablet 3  . ibuprofen (ADVIL,MOTRIN) 800 MG tablet Take 800 mg by mouth every 8 (eight) hours as needed.    . cyclobenzaprine (FLEXERIL) 10 MG tablet Take 1 tablet (10 mg total) by mouth 3 (three) times daily as needed for muscle spasms. (Patient not taking: Reported on 06/25/2018) 30 tablet 0  . diclofenac sodium (VOLTAREN) 1 % GEL Apply 2 g topically 2 (two) times daily as needed (apply to SORE SPOTS ON RIBS). Refills with PCP ONLY (Patient not taking: Reported on 06/25/2018) 100 g 0  . traMADol (ULTRAM) 50 MG tablet Take 1 tablet (50 mg total) by mouth every 8 (eight) hours as needed. (Patient not taking: Reported on 06/25/2018) 30 tablet 0   No  current facility-administered medications on file prior to visit.     No Known Allergies  Objective:  General: Alert and oriented x3 in no acute distress  Dermatology: Intact, no breaks in skin, no signs of infection. No callus. Nails WNL.   Vascular: + Trace edema at Right>Left ankle, CFT intact in all digits. Temperature gradient within normal limits.  Neurology: Michaell Cowing sensation intact via light touch via light touch at exposed digits   Musculoskeletal: + tenderness with palpation at medial ankle on right at PT tendon course and to medial malleolus. Mild subjective shooting with end ankle range of motion with tightness of calf on right like before. Unable to fully load forefoot on right or run due to pain.   Assessment and Plan: Problem List Items Addressed This Visit      Musculoskeletal and Integument   Plantar fasciitis    Other Visit Diagnoses    Tenosynovitis of ankle    -  Primary   Sprain of right ankle, unspecified ligament, subsequent encounter       Pain in right foot       Pes planus of both feet       Pain and swelling of right ankle          -Complete examination performed -Discussed with patient nature of sprain injury and tenosynovitis and pain and swelling  -Continue with PT  -Advised compression socks and f/u with PCP for edema control -Orthotics were dispensed at this visit  with wear and break in explained -Continue with good supportive shoes and activities to tolerance -Avoid strenous exercises  -Patient to return to office in 2-3 weeks for orthotic check or sooner if condition worsens.  Asencion Islamitorya Kiamesha Samet, DPM

## 2018-06-26 ENCOUNTER — Ambulatory Visit: Payer: 59 | Attending: Sports Medicine | Admitting: Physical Therapy

## 2018-06-26 ENCOUNTER — Encounter: Payer: Self-pay | Admitting: Physical Therapy

## 2018-06-26 DIAGNOSIS — M6281 Muscle weakness (generalized): Secondary | ICD-10-CM | POA: Diagnosis not present

## 2018-06-26 DIAGNOSIS — M25671 Stiffness of right ankle, not elsewhere classified: Secondary | ICD-10-CM | POA: Diagnosis not present

## 2018-06-26 DIAGNOSIS — R2689 Other abnormalities of gait and mobility: Secondary | ICD-10-CM | POA: Diagnosis not present

## 2018-06-26 DIAGNOSIS — M25571 Pain in right ankle and joints of right foot: Secondary | ICD-10-CM

## 2018-06-26 NOTE — Therapy (Addendum)
[place], [place], [address] Phone: [phone]   Fax:  [phone]  Physical Therapy Treatment/Discharge  Patient Details  Name: Victor Jensen MRN: [id number] Date of Birth: 1985/08/01 Referring Provider (PT): Landis Martins   Encounter Date: 06/26/2018  PT End of Session - 06/26/18 1621    Visit Number  14    Number of Visits  24    Date for PT Re-Evaluation  07/19/18    Authorization Type  [employer]  $20 Copay . No Auth required.   Telehealth- no copay, no visit limit    PT Start Time  1530    PT Stop Time  1615    PT Time Calculation (min)  45 min    Activity Tolerance  Patient tolerated treatment well    Behavior During Therapy  WFL for tasks assessed/performed       Past Medical History:  Diagnosis Date  . Allergic rhinitis   . Allergy   . Family history of premature CAD    father, died MI in early 35s  . H/O cardiovascular stress test 01/2015   LVH, EF 65-70%, but other imaging advised, Dr. Percival Spanish  . History of MRI of chest    cardiac MRI.  Concentric LVH, mildly dilated LV. Dr. Percival Spanish  . History of sleep disturbance 04/2015   equivocal sleep study 04/2015  . Obesity     Past Surgical History:  Procedure Laterality Date  . NO PAST SURGERIES  06/2015  . None      There were no vitals filed for this visit.  Subjective Assessment - 06/26/18 1532    Subjective  No pain until I put the shoe on.                        [facility] Adult PT Treatment/Exercise - 06/26/18 0001      Modalities   Modalities  Iontophoresis      Iontophoresis   Type of Iontophoresis  Dexamethasone    Location  Rt distal post tib    Dose  1cc    Time  6 hr wear      Manual Therapy   Manual therapy comments  ice massage 5 min distal post tib    Passive ROM  DF stretch      Ankle Exercises: Seated   Other Seated Ankle Exercises  toe curls with arch over bolster    Other Seated Ankle  Exercises  arch raises      Ankle Exercises: Stretches   Other Stretch  heel slide- towel bunched for arch support      Ankle Exercises: Aerobic   Nustep  2 min, goal of soleus stretch   ended d/t pain     Ankle Exercises: Supine   Isometrics  isometric inversion by PT             PT Education - 06/26/18 1626    Education Details  anatomy of condition, IONTO, DN, arch support wear    Person(s) Educated  Patient    Methods  Explanation    Comprehension  Verbalized understanding;Need further instruction       PT Short Term Goals - 06/07/18 0755      PT SHORT TERM GOAL #1   Title  Pt will be I with initial HEP for continued strengthening and mobility in 4 weeks.      Status  Achieved      PT SHORT TERM  GOAL #2   Title  R ankle DF AROM will improve from -11 to 10 degrees in order to stand without pain.     Baseline  8 AROM        PT Long Term Goals - 06/07/18 0755      PT LONG TERM GOAL #1   Title  R ankle PF strength will improve to 3+/5 in order to perform stairs pain free by   .     Baseline  R ankle PF MMT  3-/5 unable to lift heel against gravity in SLS    Status  On-going      PT LONG TERM GOAL #2   Title  Pt will walk independently 200 feet without A.D. nor CAM boot with reciprocal gait without antalgia by 06/06/18.     Status  Achieved      PT LONG TERM GOAL #3   Title  FOTO score will improve from 76% limited to <44% to demo improved function and mobility.     Status  Deferred      PT LONG TERM GOAL #4   Title  Pt will be able to ambulate comfortably for at least 1 hour    Baseline  max 35 min    Status  New            Plan - 06/26/18 1611    Clinical Impression Statement  Swelling noted along distal posterior tib, ice massage utilized to decrease some of the pain. Discussed doing 1hr wear and 1 hour out for inserts to help him get used to them since he was so sore after a 4hr wear. Ionto patch placed today and he is going to work today. Will  determine outcome of that treatment and consider DN at next visit.     PT Treatment/Interventions  ADLs/Self Care Home Management;Cryotherapy;Ultrasound;Moist Heat;Iontophoresis '4mg'$ /ml Dexamethasone;Electrical Stimulation;DME Instruction;Gait training;Stair training;Functional mobility training;Manual techniques;Neuromuscular re-education;Balance training;Therapeutic exercise;Therapeutic activities;Patient/family education;Orthotic Fit/Training;Passive range of motion;Taping;Dry needling    PT Next Visit Plan  outcome of ionto?  TPDN    PT Home Exercise Plan  Ankle AROM: circles, alphabet, DF/PF, gastroc stretch. LAQ, SLR, hip abd, toe curls, arch lifts, gait pattern, stretches for low back; anterior tibial translation in seated; seated heel/toe, iso inversion, standing a/p weight shift; ankle tband 3-way, lateral stepping, corner turns, stride stance weight shift; prone hip ext knee flexecd; sidelying hip abd+hip flx/ext+knee flx/ext    Consulted and Agree with Plan of Care  Patient       Patient will benefit from skilled therapeutic intervention in order to improve the following deficits and impairments:  Abnormal gait, Pain, Increased muscle spasms, Decreased mobility, Decreased activity tolerance, Decreased endurance, Decreased range of motion, Decreased strength, Difficulty walking, Impaired flexibility, Increased edema, Decreased balance  Visit Diagnosis: Other abnormalities of gait and mobility  Muscle weakness (generalized)  Stiffness of right ankle, not elsewhere classified  Pain in right ankle and joints of right foot     Problem List Patient Active Problem List   Diagnosis Date Noted  . Right foot pain 03/12/2018  . Acute right ankle pain 03/12/2018  . Left foot pain 01/14/2018  . Vaccine counseling 07/10/2017  . Plantar fasciitis 07/10/2017  . Acne 07/10/2017  . Essential hypertension, benign 05/29/2017  . Encounter for health maintenance examination in adult 07/23/2015   . 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 Impaired fasting blood sugar 07/23/2015  . Obesity 07/23/2015  . LVH (left ventricular hypertrophy) 07/23/2015  . Family  history of premature CAD 07/23/2015  . Family history of diabetes mellitus 07/23/2015    Legion Discher C. Venesa Semidey PT, DPT 06/26/18 4:28 PM   [facility] [address] [place], [place], [address] Phone: [phone]   Fax:  [phone]  Name: JAYLENN ALTIER MRN: [id number] Date of Birth: 01-26-1985  PHYSICAL THERAPY DISCHARGE SUMMARY  Visits from Start of Care: 14  Current functional level related to goals / functional outcomes: See above   Remaining deficits: See above   Education / Equipment: Anatomy of condition, POC, HEP, exercise form/rationale  Plan: Patient agrees to discharge.  Patient goals were partially met. Patient is being discharged due to not returning since the last visit.  ?????     Noreta Kue C. Anitha Kreiser PT, DPT 09/19/18 4:11 PM

## 2018-06-28 ENCOUNTER — Ambulatory Visit: Payer: 59 | Admitting: Physical Therapy

## 2018-07-04 ENCOUNTER — Telehealth: Payer: Self-pay | Admitting: Physical Therapy

## 2018-07-04 ENCOUNTER — Ambulatory Visit: Payer: 59 | Admitting: Physical Therapy

## 2018-07-04 NOTE — Telephone Encounter (Signed)
Attempted to contact pt regarding no show. Mail box is full, unable to leave VM.  Ethelyne Erich C. Crucita Lacorte PT, DPT 07/04/18 1:23 PM

## 2018-07-16 ENCOUNTER — Other Ambulatory Visit: Payer: Self-pay

## 2018-07-16 ENCOUNTER — Ambulatory Visit (INDEPENDENT_AMBULATORY_CARE_PROVIDER_SITE_OTHER): Payer: 59 | Admitting: Sports Medicine

## 2018-07-16 ENCOUNTER — Telehealth: Payer: Self-pay | Admitting: *Deleted

## 2018-07-16 ENCOUNTER — Encounter: Payer: Self-pay | Admitting: Sports Medicine

## 2018-07-16 DIAGNOSIS — M79671 Pain in right foot: Secondary | ICD-10-CM

## 2018-07-16 DIAGNOSIS — M25471 Effusion, right ankle: Secondary | ICD-10-CM

## 2018-07-16 DIAGNOSIS — M2142 Flat foot [pes planus] (acquired), left foot: Secondary | ICD-10-CM | POA: Diagnosis not present

## 2018-07-16 DIAGNOSIS — S82899A Other fracture of unspecified lower leg, initial encounter for closed fracture: Secondary | ICD-10-CM

## 2018-07-16 DIAGNOSIS — S93401D Sprain of unspecified ligament of right ankle, subsequent encounter: Secondary | ICD-10-CM

## 2018-07-16 DIAGNOSIS — M2141 Flat foot [pes planus] (acquired), right foot: Secondary | ICD-10-CM

## 2018-07-16 DIAGNOSIS — M722 Plantar fascial fibromatosis: Secondary | ICD-10-CM | POA: Diagnosis not present

## 2018-07-16 DIAGNOSIS — M65979 Unspecified synovitis and tenosynovitis, unspecified ankle and foot: Secondary | ICD-10-CM

## 2018-07-16 DIAGNOSIS — M659 Synovitis and tenosynovitis, unspecified: Secondary | ICD-10-CM | POA: Diagnosis not present

## 2018-07-16 DIAGNOSIS — M25571 Pain in right ankle and joints of right foot: Secondary | ICD-10-CM | POA: Diagnosis not present

## 2018-07-16 MED ORDER — PREDNISONE 10 MG (21) PO TBPK: ORAL_TABLET | ORAL | 0 refills | Status: DC

## 2018-07-16 NOTE — Telephone Encounter (Signed)
-----   Message from [place], [place] sent at 07/16/2018  8:35 AM EDT ----- Regarding: MRI R foot and Ankle Continued pain at medial ankle and at Lisfranc joint  Hx of a sprain injury

## 2018-07-16 NOTE — Progress Notes (Signed)
Subjective: Victor Jensen is a 33 y.o. male patient who presents to office for evaluation of right ankle/foot pain. Patient reports that his orthotics are helping but still has a lot of pain at the inside ankle and top of foot. The pain at the foot is constant. Patient admits some swelling and says that he feels like he is getting better but there is a lot of pain when going up on toes. No other issues noted.  Patient Active Problem List   Diagnosis Date Noted  . Right foot pain 03/12/2018  . Acute right ankle pain 03/12/2018  . Left foot pain 01/14/2018  . Vaccine counseling 07/10/2017  . Plantar fasciitis 07/10/2017  . Acne 07/10/2017  . Essential hypertension, benign 05/29/2017  . Encounter for health maintenance examination in adult 07/23/2015  . 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 Impaired fasting blood sugar 07/23/2015  . Obesity 07/23/2015  . LVH (left ventricular hypertrophy) 07/23/2015  . Family history of premature CAD 07/23/2015  . Family history of diabetes mellitus 07/23/2015    Current Outpatient Medications on File Prior to Visit  Medication Sig Dispense Refill  . allopurinol (ZYLOPRIM) 300 MG tablet Take 1 tablet (300 mg total) by mouth daily. 90 tablet 3  . amLODipine (NORVASC) 10 MG tablet Take 1 tablet (10 mg total) by mouth daily. 30 tablet 3  . cyclobenzaprine (FLEXERIL) 10 MG tablet Take 1 tablet (10 mg total) by mouth 3 (three) times daily as needed for muscle spasms. (Patient not taking: Reported on 06/25/2018) 30 tablet 0  . diclofenac sodium (VOLTAREN) 1 % GEL Apply 2 g topically 2 (two) times daily as needed (apply to [place]). Refills with PCP ONLY (Patient not taking: Reported on 06/25/2018) 100 g 0  . ibuprofen (ADVIL,MOTRIN) 800 MG tablet Take 800 mg by mouth every 8 (eight) hours as needed.    . traMADol (ULTRAM) 50 MG tablet Take 1 tablet (50 mg total) by mouth every 8 (eight) hours as needed. (Patient not taking: Reported on 06/25/2018) 30  tablet 0   No current facility-administered medications on file prior to visit.     No Known Allergies  Objective:  General: Alert and oriented x3 in no acute distress  Dermatology: Intact, no breaks in skin, no signs of infection. No callus. Nails WNL.   Vascular: + Trace edema at Right>Left ankle, CFT intact in all digits. Temperature gradient within normal limits.  Neurology: Johney Maine sensation intact via light touch via light touch at exposed digits   Musculoskeletal: + tenderness with palpation at medial ankle on right at PT tendon course and to medial malleolus. Pain at 1st met base on right. Mild subjective soreness at front of ankle and shooting with end ankle range of motion with tightness of calf on right like before that is slowly getting petter. Unable to fully load forefoot on right or run due to pain.   Assessment and Plan: Problem List Items Addressed This Visit      Musculoskeletal and Integument   Plantar fasciitis    Other Visit Diagnoses    Tenosynovitis of ankle    -  Primary   Sprain of right ankle, unspecified ligament, subsequent encounter       Pain in right foot       Pain and swelling of right ankle       Pes planus of both feet          -Complete examination performed -Discussed with patient nature  of sprain injury and tenosynovitis and pain and swelling  -Rx repeat MRI to evaluate Lisfranc -Rx Prednisone dose pack -Continue with custom insoles and trilock for extra stability -Advised OTC compression stockings for edema control  -Return after repeat MRI or sooner if condition worsens.  Landis Martins, DPM

## 2018-07-16 NOTE — Telephone Encounter (Signed)
Orders given to Gretta Arab, RN for pre-cert, faxed to [facility].

## 2018-07-25 ENCOUNTER — Other Ambulatory Visit: Payer: Self-pay | Admitting: Medical

## 2018-07-25 MED FILL — AMLODIPINE BESYLATE 10 MG T: 10 | 30 days supply | Qty: 30 | Fill #0

## 2018-08-08 ENCOUNTER — Ambulatory Visit (HOSPITAL_COMMUNITY)
Admission: RE | Admit: 2018-08-08 | Discharge: 2018-08-08 | Disposition: A | Discharge status: Home / Self Care | Payer: 59 | Source: Ambulatory Visit | Attending: Sports Medicine | Admitting: Sports Medicine

## 2018-08-08 ENCOUNTER — Other Ambulatory Visit: Payer: Self-pay

## 2018-08-08 DIAGNOSIS — M25871 Other specified joint disorders, right ankle and foot: Secondary | ICD-10-CM | POA: Diagnosis not present

## 2018-08-08 DIAGNOSIS — S82899A Other fracture of unspecified lower leg, initial encounter for closed fracture: Secondary | ICD-10-CM | POA: Diagnosis not present

## 2018-08-08 DIAGNOSIS — M65871 Other synovitis and tenosynovitis, right ankle and foot: Secondary | ICD-10-CM | POA: Diagnosis not present

## 2018-08-08 DIAGNOSIS — R600 Localized edema: Secondary | ICD-10-CM | POA: Diagnosis not present

## 2018-08-08 DIAGNOSIS — M19071 Primary osteoarthritis, right ankle and foot: Secondary | ICD-10-CM | POA: Diagnosis not present

## 2018-08-19 ENCOUNTER — Encounter: Payer: Self-pay | Admitting: Sports Medicine

## 2018-09-02 ENCOUNTER — Encounter: Payer: Self-pay | Admitting: Sports Medicine

## 2018-09-10 ENCOUNTER — Ambulatory Visit
Admission: RE | Admit: 2018-09-10 | Discharge: 2018-09-10 | Disposition: A | Discharge status: Home / Self Care | Payer: 59 | Source: Ambulatory Visit | Attending: Medical | Admitting: Medical

## 2018-09-10 ENCOUNTER — Encounter: Payer: Self-pay | Admitting: Medical

## 2018-09-10 ENCOUNTER — Ambulatory Visit: Payer: 59 | Admitting: Medical

## 2018-09-10 ENCOUNTER — Other Ambulatory Visit: Payer: Self-pay

## 2018-09-10 VITALS — BP 130/80 | HR 80 | Temp 98.4°F | Resp 16 | Wt 305.2 lb

## 2018-09-10 DIAGNOSIS — M25562 Pain in left knee: Secondary | ICD-10-CM | POA: Insufficient documentation

## 2018-09-10 DIAGNOSIS — S8992XA Unspecified injury of left lower leg, initial encounter: Secondary | ICD-10-CM | POA: Diagnosis not present

## 2018-09-10 MED ORDER — IBUPROFEN 800 MG PO TABS: 800.0000 mg | ORAL_TABLET | Freq: Three times a day (TID) | ORAL | 0 refills | Status: DC | PRN

## 2018-09-10 MED ORDER — HYDROCODONE-ACETAMINOPHEN 7.5-325 MG PO TABS: 1.0000 | ORAL_TABLET | Freq: Four times a day (QID) | ORAL | 0 refills | Status: AC | PRN

## 2018-09-10 MED FILL — HYDROCODON-APAP 7.5-325: 7.5-325 | 3 days supply | Qty: 12 | Fill #0

## 2018-09-10 MED FILL — IBUPROFEN 800 MG TAB: 800 | 10 days supply | Qty: 30 | Fill #0

## 2018-09-10 NOTE — Progress Notes (Signed)
Subjective: Chief Complaint  Patient presents with  . knee pain    left knee pain, cant bend it playing basketball X saturday   Here for left knee pain.   Had injury 09/07/18.   He was playing basketball with daughter, goal was lower than typical.    He dunked the basketball, came down on left knee solely, landed solid, no give or play.   When he landed felt immediately pain.   Went inside to rest.   Felt nauseated given the unusual pain that he has never felt prior.   Since then can't move well, having pain bearing weight.   Worked yesterday but stood in place due to pain.  Iced the knee 30 minutes at a time the last 3 days.   Has used Ibuprofen 800mg  several times the last 2 days.   Left knee seems swollen.   No other aggravating or relieving factors. No other complaint.  ROS as in subjective  Objective: BP 130/80   Pulse 80   Temp 98.4 F (36.9 C) (Oral)   Resp 16   Wt (!) 305 lb 3.2 oz (138.4 kg)   SpO2 98%   BMI 42.57 kg/m    Gen: wd, wn, nad, AA male Skin: no erythema or bruising, no warmth MSK: guarded of left knee, putting weight on right knee when standing, walking with left leg straightened and in pain, mild swelling in anterior knee around patella, negative apprehension test the patella, unable to get a full McMurray test given his pain level and guarding, seemingly negative on ACL testing lateral laxity test, very tender over the superior lateral patella, unable to bend the knee much at all due to pain, cannot bend and flex the knee roughly 15 degrees, can fully extend but with pain  Hip and ankle non tender, seemingly normal ROM without pain Legs neurovascularly intact.     Assessment: Encounter Diagnoses  Name Primary?  . Acute pain of left knee Yes  . Injury of left knee, initial encounter      Plan: Examined patient with supervising physician Dr. Redmond School.   Discussed differential diagnosis, including avulsion fracture, patellar injury, meniscus injury, but no  obvious patellar dislocation, no obvious ACL tear based on exam today.    Imaging: We will send for x-ray   Patient Instructions  Please go to [facility] for your left knee xray.   Their hours are 8am - 4:30 pm Monday - Friday.  Take your insurance card with you.  [facility] [phone]  [address], [place], [place] [address]  [address], [place] [address]    You can use Ibuprofen 800mg  3 times daily for pain and inflammation continue to ice 20 minutes on, 20 minutes off You can use Norco/Hydrocodone for worse pain We will call with xray results If no fracture, I will have you get a knee immobilizer brace.     [facility] [address], [place],  [address] [phone] phone      F/u pending imaging    Lawayne was seen today for knee pain.  Diagnoses and all orders for this visit:  Acute pain of left knee -     DG Knee Complete 4 Views Left; Future  Injury of left knee, initial encounter -     DG Knee Complete 4 Views Left; Future  Other orders -     ibuprofen (ADVIL) 800 MG tablet; Take 1 tablet (800 mg total) by mouth every  8 (eight) hours as needed. -     HYDROcodone-acetaminophen (NORCO) 7.5-325 MG tablet; Take 1 tablet by mouth every 6 (six) hours as needed for up to 5 days for moderate pain.

## 2018-09-10 NOTE — Patient Instructions (Signed)
Please go to [facility] for your left knee xray.   Their hours are 8am - 4:30 pm Monday - Friday.  Take your insurance card with you.  [facility] [phone]  [address], [place], [place] [address]  [address], [place] [address]    You can use Ibuprofen 800mg  3 times daily for pain and inflammation continue to ice 20 minutes on, 20 minutes off You can use Norco/Hydrocodone for worse pain We will call with xray results If no fracture, I will have you get a knee immobilizer brace.     [facility] [address] [place], [place] [address] [phone] phone

## 2018-09-17 ENCOUNTER — Encounter: Payer: Self-pay | Admitting: Family Medicine

## 2018-09-17 ENCOUNTER — Encounter: Payer: Self-pay | Admitting: Medical

## 2018-09-17 ENCOUNTER — Other Ambulatory Visit: Payer: Self-pay

## 2018-09-17 ENCOUNTER — Ambulatory Visit: Payer: 59 | Admitting: Family Medicine

## 2018-09-17 VITALS — BP 144/92 | HR 75 | Temp 98.4°F | Wt 305.8 lb

## 2018-09-17 DIAGNOSIS — M25562 Pain in left knee: Secondary | ICD-10-CM | POA: Diagnosis not present

## 2018-09-17 NOTE — Patient Instructions (Signed)
Take 800 mg of ibuprofen 3 times per day for the pain.  Switch to heat for 20 minutes 3 times per day

## 2018-09-17 NOTE — Progress Notes (Signed)
   Subjective:    Patient ID: Victor Jensen, male    DOB: [birth year], 33 y.o.   MRN: [id number]  HPI He is here for recheck.  He states that he is doing better with less pain but still does feel some weakness in it.  He has occasionally felt like it was going to give way.  He has not been working because of this.   Review of Systems     Objective:   Physical Exam Alert and in no distress.  Minimal tenderness to palpation to the superior lateral patella with no defect in that area or proximal to that.  No effusion is noted.  Negative anterior drawer.  McMurray's testing negative.       Assessment & Plan:  Acute pain of left knee Take 800 mg of ibuprofen 3 times per day for the pain.  Switch to heat for 20 minutes 3 times per day He is to return here in 2 weeks.  Discussed incrementally increasing his physical activity.  Explained that going up and down steps would be more difficult with going up and he has noticed that.  I will give him a note for no use of left leg for 2 weeks and then recheck that.  I will then go over rehab again with him.

## 2018-09-24 HISTORY — PX: NO PAST SURGERIES: SHX2092

## 2018-10-01 ENCOUNTER — Encounter: Payer: Self-pay | Admitting: Medical

## 2018-10-01 ENCOUNTER — Encounter: Payer: 59 | Admitting: Medical

## 2018-10-01 NOTE — Progress Notes (Deleted)
Update social hx Recommend flu vax Equivocal sleep study 2017 Labs: CMET, CBC, uric acid, consider A1C, lipid Last cardiology eval 2017  Medical team: Podiatry, Dr. Landis Martins Cardiology, Dr. Minus Breeding Tysinger, Camelia Eng, PA-C   Past Medical History:  Diagnosis Date  . Allergic rhinitis   . Allergy   . Elevated uric acid in blood   . Family history of premature CAD    father, died MI in early 3s  . H/O cardiovascular stress test 01/2015   LVH, EF 65-70%, but other imaging advised, Dr. Percival Spanish  . History of MRI of chest    cardiac MRI.  Concentric LVH, mildly dilated LV. Dr. Percival Spanish  . History of sleep disturbance 04/2015   equivocal sleep study 04/2015  . Hypertension   . Obesity

## 2018-10-23 ENCOUNTER — Encounter: Payer: 59 | Admitting: Medical

## 2018-10-25 ENCOUNTER — Encounter: Payer: Self-pay | Admitting: Medical

## 2018-12-22 ENCOUNTER — Encounter: Payer: Self-pay | Admitting: Sports Medicine

## 2019-04-01 ENCOUNTER — Ambulatory Visit: Payer: 59 | Admitting: Sports Medicine

## 2019-06-10 ENCOUNTER — Telehealth: Payer: Self-pay | Admitting: Cardiology

## 2019-06-10 NOTE — Telephone Encounter (Signed)
Called patient 06/10/19 to schedule appointment, left message 

## 2019-08-07 ENCOUNTER — Ambulatory Visit (INDEPENDENT_AMBULATORY_CARE_PROVIDER_SITE_OTHER): Payer: Self-pay | Admitting: Medical

## 2019-08-07 ENCOUNTER — Other Ambulatory Visit: Payer: Self-pay

## 2019-08-07 ENCOUNTER — Encounter: Payer: Self-pay | Admitting: Medical

## 2019-08-07 VITALS — BP 148/98 | HR 91

## 2019-08-07 DIAGNOSIS — M79672 Pain in left foot: Secondary | ICD-10-CM

## 2019-08-07 DIAGNOSIS — M2142 Flat foot [pes planus] (acquired), left foot: Secondary | ICD-10-CM

## 2019-08-07 DIAGNOSIS — G8929 Other chronic pain: Secondary | ICD-10-CM

## 2019-08-07 DIAGNOSIS — M25572 Pain in left ankle and joints of left foot: Secondary | ICD-10-CM

## 2019-08-07 DIAGNOSIS — I10 Essential (primary) hypertension: Secondary | ICD-10-CM

## 2019-08-07 DIAGNOSIS — M2141 Flat foot [pes planus] (acquired), right foot: Secondary | ICD-10-CM

## 2019-08-07 DIAGNOSIS — M659 Synovitis and tenosynovitis, unspecified: Secondary | ICD-10-CM

## 2019-08-07 MED ORDER — HYDROCODONE-ACETAMINOPHEN 5-325 MG PO TABS: 1.0000 | ORAL_TABLET | Freq: Two times a day (BID) | ORAL | 0 refills | Status: DC

## 2019-08-07 MED ORDER — METOPROLOL TARTRATE 25 MG PO TABS
25.0000 mg | ORAL_TABLET | Freq: Two times a day (BID) | ORAL | 1 refills | Status: DC
Start: 2019-08-07 — End: 2019-10-03

## 2019-08-07 NOTE — Progress Notes (Signed)
Subjective:  Victor Jensen is a 34 y.o. male who presents for Chief Complaint  Patient presents with  . Ankle Pain    left with swelling      Here for left foot pain and swelling and ankle pain and swelling.  This started about a week ago.  He notes intermittent problems with similar pains in both feet.  Last year he ended up seeing podiatry for tenosynovitis of the right foot and ankle, was initially put in Aircast but no improvement after 2 weeks.  He was transferred to a hard cast for 3 weeks.  And he ended up being out of work for 3 months to this problem.  He was favoring his left foot for a while last year.  Now recently he has been having the intermittent pains of the left ankle and foot.  No numbness or tingling.  No knee or hip pain.  He has not been taking his blood pressure medicine.  Has not been back to see cardiology in a while  No other aggravating or relieving factors.    No other c/o.  The following portions of the patient's history were reviewed and updated as appropriate: allergies, current medications, past family history, past medical history, past social history, past surgical history and problem list.  ROS Otherwise as in subjective above  Objective: BP (!) 148/98   Pulse 91   SpO2 98%   General appearance: alert, no distress, well developed, well nourished Heart: RRR, normal S1, S2, no murmurs Lungs: CTA bilaterally, no wheezes, rhonchi, or rales Pulses: 2+ radial pulses, 2+ pedal pulses, normal cap refill Ext: no edema MSK: Tender over left medial malleolus, tender over left anterior ankle and deltoid ligament, decreased range of motion in general, mild swelling of the ankle, rest of leg and knee exam unremarkable, rest of leg exam unremarkable Legs neurovascularly intact   Assessment: Encounter Diagnoses  Name Primary?  . Essential hypertension, benign Yes  . Tenosynovitis of left ankle   . Chronic pain of left ankle   . Foot pain, left   .  Pes planus of both feet      Plan: We discussed his foot pain and ankle pain-he will use RICE, rest, ice, compression, elevation begin pain medicine below short-term up to twice daily.  Cautioned on sedation.  He can use short-term Aleve over-the-counter 1 tablet twice a day for 3 to 4 days.  In general we want to avoid NSAIDs given his blood pressure issues.  He will use either an ASO splint or cam walker through [employer].  He does not have insurance currently.  We will go ahead and take him out of work for a week to give him a chance to heal.  We will tentatively refer back to podiatry given similar problem he had last year it took months to heal  Hypertension-noncompliant, not currently taking amlodipine.  He has not tolerated 3 different medicines in the past including hydrochlorothiazide, amlodipine, losartan.  I had referred him a while back to cardiology for this issue.  He has not been back since his initial visits and he recently has not been taking any blood pressure medication.  I will change him to metoprolol today.  Discussed risk and benefits of medication proper use of medication.  We discussed the need to work on weight loss through healthy diet and exercise  Victor Jensen was seen today for ankle pain.  Diagnoses and all orders for this visit:  Essential hypertension, benign  Tenosynovitis  of left ankle -     Ambulatory referral to Podiatry  Chronic pain of left ankle -     Ambulatory referral to Podiatry  Foot pain, left -     Ambulatory referral to Podiatry  Pes planus of both feet -     Ambulatory referral to Podiatry  Other orders -     HYDROcodone-acetaminophen (NORCO) 5-325 MG tablet; Take 1 tablet by mouth in the morning and at bedtime. -     metoprolol tartrate (LOPRESSOR) 25 MG tablet; Take 1 tablet (25 mg total) by mouth 2 (two) times daily.    Follow up: 1wk

## 2019-09-18 ENCOUNTER — Ambulatory Visit: Payer: 59 | Admitting: Sports Medicine

## 2019-09-30 ENCOUNTER — Ambulatory Visit: Payer: Self-pay | Admitting: Podiatry

## 2019-10-03 ENCOUNTER — Other Ambulatory Visit: Payer: Self-pay | Admitting: Medical

## 2019-10-30 ENCOUNTER — Ambulatory Visit: Payer: Self-pay | Admitting: Sports Medicine

## 2019-11-05 ENCOUNTER — Other Ambulatory Visit: Payer: Self-pay | Admitting: Medical

## 2019-12-22 ENCOUNTER — Telehealth: Payer: Self-pay | Admitting: Medical

## 2019-12-22 ENCOUNTER — Other Ambulatory Visit: Payer: Self-pay

## 2019-12-22 MED ORDER — METOPROLOL TARTRATE 25 MG PO TABS: 25.0000 mg | ORAL_TABLET | Freq: Two times a day (BID) | ORAL | 2 refills | Status: DC

## 2019-12-22 MED FILL — METOPROLOL TARTRATE 25 MG T: 25 | 30 days supply | Qty: 60 | Fill #0

## 2019-12-22 NOTE — Telephone Encounter (Signed)
[facility]    Pt called for refills of metoprolol. Please send to [facility]. Pt can be reached at [phone].

## 2019-12-22 NOTE — Telephone Encounter (Signed)
Patient pharmacy has been updated and medication has been sent.

## 2020-01-26 ENCOUNTER — Other Ambulatory Visit: Payer: Self-pay

## 2020-01-26 DIAGNOSIS — Z20822 Contact with and (suspected) exposure to covid-19: Secondary | ICD-10-CM

## 2020-01-28 LAB — NOVEL CORONAVIRUS, NAA: SARS-CoV-2, NAA: NOT DETECTED

## 2020-01-28 LAB — SARS-COV-2, NAA 2 DAY TAT

## 2020-02-17 ENCOUNTER — Encounter: Payer: Self-pay | Admitting: Cardiology

## 2020-03-03 MED FILL — METOPROLOL TARTRATE 25 MG T: 25 | 30 days supply | Qty: 60 | Fill #1

## 2021-03-19 ENCOUNTER — Other Ambulatory Visit (HOSPITAL_COMMUNITY): Payer: Self-pay

## 2021-03-19 MED ORDER — OLMESARTAN MEDOXOMIL 20 MG PO TABS
ORAL_TABLET | ORAL | 3 refills | Status: DC
  Filled 2021-03-19: qty 30, 30d supply, fill #0

## 2021-03-19 MED ORDER — GABAPENTIN 100 MG PO CAPS
100.0000 mg | ORAL_CAPSULE | ORAL | 0 refills | Status: DC
  Filled 2021-03-19: qty 21, 7d supply, fill #0

## 2021-03-19 MED ORDER — PREDNISONE 10 MG (21) PO TBPK
ORAL_TABLET | ORAL | 0 refills | Status: DC
  Filled 2021-03-19: qty 21, 6d supply, fill #0

## 2021-03-21 ENCOUNTER — Other Ambulatory Visit (HOSPITAL_COMMUNITY): Payer: Self-pay

## 2021-09-28 ENCOUNTER — Encounter: Payer: Self-pay | Admitting: Internal Medicine

## 2021-11-01 ENCOUNTER — Encounter: Payer: Self-pay | Admitting: Internal Medicine

## 2023-09-05 ENCOUNTER — Ambulatory Visit: Payer: Self-pay | Admitting: Family Medicine

## 2023-10-08 ENCOUNTER — Other Ambulatory Visit (HOSPITAL_COMMUNITY): Payer: Self-pay

## 2023-10-08 ENCOUNTER — Other Ambulatory Visit: Payer: Self-pay

## 2023-10-08 ENCOUNTER — Ambulatory Visit (HOSPITAL_COMMUNITY)
Admission: EM | Admit: 2023-10-08 | Discharge: 2023-10-08 | Disposition: A | Discharge status: Home / Self Care | Attending: Physician Assistant | Admitting: Physician Assistant

## 2023-10-08 ENCOUNTER — Encounter (HOSPITAL_COMMUNITY): Payer: Self-pay | Admitting: Emergency Medicine

## 2023-10-08 DIAGNOSIS — I10 Essential (primary) hypertension: Secondary | ICD-10-CM | POA: Diagnosis not present

## 2023-10-08 DIAGNOSIS — H9202 Otalgia, left ear: Secondary | ICD-10-CM | POA: Diagnosis not present

## 2023-10-08 DIAGNOSIS — H6122 Impacted cerumen, left ear: Secondary | ICD-10-CM

## 2023-10-08 DIAGNOSIS — H60502 Unspecified acute noninfective otitis externa, left ear: Secondary | ICD-10-CM | POA: Diagnosis not present

## 2023-10-08 MED ORDER — AMOXICILLIN-POT CLAVULANATE 875-125 MG PO TABS
1.0000 | ORAL_TABLET | Freq: Two times a day (BID) | ORAL | 0 refills | Status: DC
  Filled 2023-10-08: qty 14, 7d supply, fill #0

## 2023-10-08 MED ORDER — OFLOXACIN 0.3 % OT SOLN
5.0000 [drp] | Freq: Two times a day (BID) | OTIC | 0 refills | Status: DC
  Filled 2023-10-08: qty 5, 10d supply, fill #0

## 2023-10-08 MED ORDER — AMLODIPINE BESYLATE 10 MG PO TABS
10.0000 mg | ORAL_TABLET | Freq: Every day | ORAL | 1 refills | Status: AC
  Filled 2023-10-08: qty 30, 30d supply, fill #0
  Filled 2023-11-07: qty 30, 30d supply, fill #1

## 2023-10-08 NOTE — ED Notes (Signed)
 Allowed patient to sit for a short time after initial attempt to remove wax.  Then attempted flushing again.  Increased number of wax pieces and larger pieces were removed .  Patient is having pain.  Notified Erin Raspet, PA of the outcome

## 2023-10-08 NOTE — ED Provider Notes (Signed)
 [facility]    CSN: [id number] Arrival date & time: 10/08/23  1012      History   Chief Complaint Chief Complaint  Patient presents with   Otalgia    HPI Victor Jensen is a 38 y.o. male.   Patient presents today with a 2-day history of significant left otalgia.  Reports pain is rated 8 on a 0-10 pain scale, described as sharp with periodic stabbing pains, no relieving factors identified.  Reports that he has had intermittent issues with his ear since going to the beach a year ago but it has become unbearable since going to the pool Saturday (10/06/2023).  He has taken ibuprofen  without improvement of symptoms.  He denies any otorrhea, fever, cough, congestion.  He has not seen an ENT in the past.  He does report a decreased hearing sensation particular in the past 2 days.  Denies any recent antibiotics in the past 90 days.  In addition, patient is noted to have elevated blood pressure.  He does have a history of hypertension and has previously been prescribed amlodipine  as well as metoprolol  but has not been taking this medicine regularly.  He denies any chest pain, shortness of breath, headache, vision change, dizziness.  Denies any increased sodium or caffeine consumption.  He has been taking some ibuprofen  due to pain.  He is followed by PCP.    Past Medical History:  Diagnosis Date   Allergic rhinitis    Allergy    Elevated uric acid in blood    Family history of premature CAD    father, died MI in early 44s   H/O cardiovascular stress test 01/2015   LVH, EF 65-70%, but other imaging advised, Dr. Lavona   History of MRI of chest    cardiac MRI.  Concentric LVH, mildly dilated LV. Dr. Lavona   History of sleep disturbance 04/2015   equivocal sleep study 04/2015   Hypertension    Obesity     Patient Active Problem List   Diagnosis Date Noted   Acute pain of left knee 09/10/2018   Injury of left knee 09/10/2018   Right foot pain 03/12/2018   Acute right  ankle pain 03/12/2018   Left foot pain 01/14/2018   Vaccine counseling 07/10/2017   Plantar fasciitis 07/10/2017   Acne 07/10/2017   Essential hypertension, benign 05/29/2017   Encounter for health maintenance examination in adult 07/23/2015   Elevated uric acid in blood 07/23/2015   Impaired fasting blood sugar 07/23/2015   Obesity 07/23/2015   LVH (left ventricular hypertrophy) 07/23/2015   Family history of premature CAD 07/23/2015   Family history of diabetes mellitus 07/23/2015    Past Surgical History:  Procedure Laterality Date   NO PAST SURGERIES  09/2018   None         Home Medications    Prior to Admission medications   Medication Sig Start Date End Date Taking? Authorizing Provider  amoxicillin -clavulanate (AUGMENTIN ) 875-125 MG tablet Take 1 tablet by mouth every 12 (twelve) hours. 10/08/23  Yes Ulyses Panico K, PA-C  ofloxacin  (FLOXIN ) 0.3 % OTIC solution Place 5 drops into the left ear 2 (two) times daily. 10/08/23  Yes Shayden Gingrich K, PA-C  amLODipine  (NORVASC ) 10 MG tablet Take 1 tablet (10 mg total) by mouth daily. 10/08/23   Numan Zylstra K, PA-C  metoprolol  tartrate (LOPRESSOR ) 25 MG tablet TAKE 1 TABLET BY MOUTH 2 TIMES DAILY 12/22/19 12/21/20  Tysinger, Alm RAMAN, PA-C  traMADol  (ULTRAM ) 50  MG tablet Take 1 tablet (50 mg total) by mouth every 8 (eight) hours as needed. Patient not taking: Reported on 09/10/2018 03/12/18   Burt Fus, DPM    Family History Family History  Problem Relation Age of Onset   Heart attack Father 29   Diabetes Father        Severe   Heart disease Father 26   Diabetes Paternal Grandfather    Diabetes Maternal Grandfather    Heart disease Maternal Grandfather    Cancer Other    Diabetes Maternal Grandmother     Social History Social History   Tobacco Use   Smoking status: Former    Current packs/day: 0.50    Average packs/day: 0.5 packs/day for 3.0 years (1.5 ttl pk-yrs)    Types: Cigarettes   Smokeless tobacco: Never   Substance Use Topics   Alcohol use: Yes   Drug use: Not Currently    Types: Marijuana    Comment: rare     Allergies   Patient has no known allergies.   Review of Systems Review of Systems  Constitutional:  Positive for activity change. Negative for appetite change, fatigue and fever.  HENT:  Positive for ear pain and hearing loss. Negative for congestion, ear discharge, sinus pressure, sneezing and sore throat.   Eyes:  Negative for visual disturbance.  Respiratory:  Negative for cough and shortness of breath.   Cardiovascular:  Negative for chest pain.  Gastrointestinal:  Negative for abdominal pain, diarrhea, nausea and vomiting.  Neurological:  Negative for dizziness, light-headedness and headaches.     Physical Exam Triage Vital Signs ED Triage Vitals  Encounter Vitals Group     BP 10/08/23 1144 (!) 144/100     Girls Systolic BP Percentile --      Girls Diastolic BP Percentile --      Boys Systolic BP Percentile --      Boys Diastolic BP Percentile --      Pulse Rate 10/08/23 1144 78     Resp 10/08/23 1144 20     Temp 10/08/23 1144 98.4 F (36.9 C)     Temp src --      SpO2 10/08/23 1144 98 %     Weight --      Height --      Head Circumference --      Peak Flow --      Pain Score 10/08/23 1140 7     Pain Loc --      Pain Education --      Exclude from Growth Chart --    No data found.  Updated Vital Signs BP (!) 164/113 Comment: left arm, large cuff  Pulse 78   Temp 98.4 F (36.9 C)   Resp 20   SpO2 98%   Visual Acuity Right Eye Distance:   Left Eye Distance:   Bilateral Distance:    Right Eye Near:   Left Eye Near:    Bilateral Near:     Physical Exam Vitals reviewed.  Constitutional:      General: He is awake.     Appearance: Normal appearance. He is well-developed. He is not ill-appearing.     Comments: Very pleasant male appears stated age in no acute distress sitting comfortably in exam room  HENT:     Head: Normocephalic and  atraumatic.     Right Ear: Tympanic membrane, ear canal and external ear normal. Tympanic membrane is not erythematous or bulging.     Left Ear:  Tympanic membrane, ear canal and external ear normal. There is impacted cerumen. Tympanic membrane is not erythematous or bulging.     Ears:     Comments: Left ear: Cerumen impaction noted; this is improved but did not completely resolved following in office irrigation.  After irrigation able to visualize approximately 10% of TM that appeared erythematous and external auditory canal was erythematous and macerated.    Nose: Nose normal.     Mouth/Throat:     Pharynx: Uvula midline. No oropharyngeal exudate, posterior oropharyngeal erythema or uvula swelling.  Cardiovascular:     Rate and Rhythm: Normal rate and regular rhythm.     Heart sounds: Normal heart sounds, S1 normal and S2 normal. No murmur heard. Pulmonary:     Effort: Pulmonary effort is normal. No accessory muscle usage or respiratory distress.     Breath sounds: Normal breath sounds. No stridor. No wheezing, rhonchi or rales.     Comments: Clear to auscultation bilaterally Abdominal:     General: Bowel sounds are normal.     Palpations: Abdomen is soft.     Tenderness: There is no abdominal tenderness.  Neurological:     Mental Status: He is alert.  Psychiatric:        Behavior: Behavior is cooperative.      UC Treatments / Results  Labs (all labs ordered are listed, but only abnormal results are displayed) Labs Reviewed - No data to display  EKG   Radiology No results found.  Procedures Procedures (including critical care time)  Medications Ordered in UC Medications - No data to display  Initial Impression / Assessment and Plan / UC Course  I have reviewed the triage vital signs and the nursing notes.  Pertinent labs & imaging results that were available during my care of the patient were reviewed by me and considered in my medical decision making (see chart for  details).     Patient is well-appearing, afebrile, nontoxic, nontachycardic.  Patient had cerumen impaction on the left we attempted to irrigate this.  We were able to remove a significant amount of wax but unable to completely visualize the TM.  Given we could not visualize this and he has severe pain we will cover for otitis media with Augmentin  twice daily for 7 days.  No indication for dose adjustment based on metabolic panel from 02/22/2018 with creatinine of 1.39 calculated creatinine clearance of 138 mL/min.  He has significant erythema and maceration of the external auditory canal following irrigation so we will also cover for otitis externa with ofloxacin .  Recommend that he avoid putting anything in his ear or submerging head in water until after symptoms resolve.  We discussed that if he is not having improvement within a few days he should follow-up with the ENT and was given the contact information for local provider.  Strict return precautions given.  Excuse note provided.  Patient was noted to be hypertensive.  He denied any signs/symptoms of endorgan damage.  He has previously taken amlodipine  but has been without this medication for several months.  Refill was provided and recommended that he monitor his blood pressure at home and keep log for evaluation of follow-up appoint with his primary care within a few weeks.  He is to avoid decongestants, caffeine, sodium, NSAIDs.  We discussed that if he develops any chest pain, shortness of breath, headache, vision change, dizziness in setting of high blood pressure he needs to go to the ER.  Strict return precautions given.  All questions answered to patient satisfaction.  Final Clinical Impressions(s) / UC Diagnoses   Final diagnoses:  Acute otitis externa of left ear, unspecified type  Acute otalgia, left  Impacted cerumen, left ear  Elevated blood pressure reading with diagnosis of hypertension     Discharge Instructions      We are  treating you for an ear infection start Augmentin  twice daily for 7 days.  Apply ofloxacin  drops twice a day for 7 days.  Keep ear facing upward for a few minutes after applying the drops to allow them to go all the way through the ear canal.  Continue Tylenol  for pain relief.  If your symptoms are not improving within a few days please follow-up with ENT; call to schedule an appointment.  If anything worsens and you have increasing pain, fever, nausea/vomiting interfering with oral intake, drainage from the ear you should be seen immediately.  Your blood pressure was elevated.  I have refilled your amlodipine .  Please avoid NSAIDs (aspirin, ibuprofen /Advil , naproxen/Aleve), decongestants, caffeine, sodium.  Monitor your blood pressure at home.  Follow-up with your primary care in a few weeks and bring log so that they can determine if dose adjustment is needed.  If you develop any chest pain, shortness of breath, headache, vision change, dizziness in the setting of high blood pressure you need to go to the emergency room.      ED Prescriptions     Medication Sig Dispense Auth. Provider   amLODipine  (NORVASC ) 10 MG tablet Take 1 tablet (10 mg total) by mouth daily. 30 tablet Yee Joss K, PA-C   ofloxacin  (FLOXIN ) 0.3 % OTIC solution Place 5 drops into the left ear 2 (two) times daily. 5 mL Myasia Sinatra K, PA-C   amoxicillin -clavulanate (AUGMENTIN ) 875-125 MG tablet Take 1 tablet by mouth every 12 (twelve) hours. 14 tablet Hurley Blevins K, PA-C      PDMP not reviewed this encounter.   Sherrell Rocky POUR, PA-C 10/08/23 1302

## 2023-10-08 NOTE — ED Triage Notes (Signed)
 Left ear pain for 2 days.  Patient went to a pool on Saturday.  Prior to going to the pool, the ear had intermittent pain and stuffiness.  Has not used any medications

## 2023-10-08 NOTE — Discharge Instructions (Addendum)
 We are treating you for an ear infection start Augmentin  twice daily for 7 days.  Apply ofloxacin  drops twice a day for 7 days.  Keep ear facing upward for a few minutes after applying the drops to allow them to go all the way through the ear canal.  Continue Tylenol  for pain relief.  If your symptoms are not improving within a few days please follow-up with ENT; call to schedule an appointment.  If anything worsens and you have increasing pain, fever, nausea/vomiting interfering with oral intake, drainage from the ear you should be seen immediately.  Your blood pressure was elevated.  I have refilled your amlodipine .  Please avoid NSAIDs (aspirin, ibuprofen /Advil , naproxen/Aleve), decongestants, caffeine, sodium.  Monitor your blood pressure at home.  Follow-up with your primary care in a few weeks and bring log so that they can determine if dose adjustment is needed.  If you develop any chest pain, shortness of breath, headache, vision change, dizziness in the setting of high blood pressure you need to go to the emergency room.

## 2023-12-11 ENCOUNTER — Other Ambulatory Visit: Payer: Self-pay | Admitting: Medical

## 2023-12-11 ENCOUNTER — Encounter (HOSPITAL_COMMUNITY): Payer: Self-pay

## 2023-12-11 ENCOUNTER — Other Ambulatory Visit (HOSPITAL_COMMUNITY): Payer: Self-pay

## 2023-12-11 NOTE — Telephone Encounter (Signed)
 Pt has not been seen since 2021. Pt needs an appt and seen before we can refill his medication

## 2024-01-18 ENCOUNTER — Other Ambulatory Visit: Payer: Self-pay

## 2024-01-18 ENCOUNTER — Emergency Department (HOSPITAL_COMMUNITY)

## 2024-01-18 ENCOUNTER — Observation Stay (HOSPITAL_COMMUNITY): Admission: EM | Admit: 2024-01-18 | Discharge: 2024-01-20 | Disposition: A

## 2024-01-18 ENCOUNTER — Encounter (HOSPITAL_COMMUNITY): Payer: Self-pay | Admitting: Emergency Medicine

## 2024-01-18 DIAGNOSIS — Z87891 Personal history of nicotine dependence: Secondary | ICD-10-CM | POA: Diagnosis not present

## 2024-01-18 DIAGNOSIS — I10 Essential (primary) hypertension: Secondary | ICD-10-CM | POA: Insufficient documentation

## 2024-01-18 DIAGNOSIS — Z79899 Other long term (current) drug therapy: Secondary | ICD-10-CM | POA: Insufficient documentation

## 2024-01-18 DIAGNOSIS — K625 Hemorrhage of anus and rectum: Secondary | ICD-10-CM

## 2024-01-18 DIAGNOSIS — R109 Unspecified abdominal pain: Secondary | ICD-10-CM | POA: Diagnosis present

## 2024-01-18 DIAGNOSIS — K3580 Unspecified acute appendicitis: Principal | ICD-10-CM | POA: Insufficient documentation

## 2024-01-18 LAB — CBC
HCT: 42.8 % (ref 39.0–52.0)
Hemoglobin: 14.2 g/dL (ref 13.0–17.0)
MCH: 25.5 pg — ABNORMAL LOW (ref 26.0–34.0)
MCHC: 33.2 g/dL (ref 30.0–36.0)
MCV: 77 fL — ABNORMAL LOW (ref 80.0–100.0)
Platelets: 212 K/uL (ref 150–400)
RBC: 5.56 MIL/uL (ref 4.22–5.81)
RDW: 13.7 % (ref 11.5–15.5)
WBC: 9.1 K/uL (ref 4.0–10.5)
nRBC: 0 % (ref 0.0–0.2)

## 2024-01-18 LAB — COMPREHENSIVE METABOLIC PANEL WITH GFR
ALT: 27 U/L (ref 0–44)
AST: 52 U/L — ABNORMAL HIGH (ref 15–41)
Albumin: 4.7 g/dL (ref 3.5–5.0)
Alkaline Phosphatase: 87 U/L (ref 38–126)
Anion gap: 10 (ref 5–15)
BUN: 11 mg/dL (ref 6–20)
CO2: 28 mmol/L (ref 22–32)
Calcium: 9.5 mg/dL (ref 8.9–10.3)
Chloride: 103 mmol/L (ref 98–111)
Creatinine, Ser: 1.09 mg/dL (ref 0.61–1.24)
GFR, Estimated: >60 mL/min
Glucose, Bld: 89 mg/dL (ref 70–99)
Potassium: 3.8 mmol/L (ref 3.5–5.1)
Sodium: 141 mmol/L (ref 135–145)
Total Bilirubin: 0.6 mg/dL (ref 0.0–1.2)
Total Protein: 8 g/dL (ref 6.5–8.1)

## 2024-01-18 MED ORDER — IOHEXOL 300 MG/ML  SOLN
100.0000 mL | Freq: Once | INTRAMUSCULAR | Status: AC | PRN
  Administered 2024-01-18: 100 mL via INTRAVENOUS

## 2024-01-18 NOTE — ED Notes (Signed)
 Urine sent to the lab. Labeled.

## 2024-01-18 NOTE — ED Triage Notes (Signed)
 Pt arrives w/ c/o lower abd pain since last night, nausea, and rectal bleeding x 1 week. Reports bright red blood in stool.

## 2024-01-18 NOTE — ED Provider Notes (Incomplete)
 " [facility] AT [facility] Provider Note   CSN: [id number] Arrival date & time: 01/18/24  1901     Patient presents with: Abdominal Pain and Rectal Bleeding   [name] is a 38 y.o. male past medical history significant for hypertension, family history of diabetes who presents concern for lower abdominal pain for last night, nausea, rectal bleeding intermittently for 1 week.  Bleeding only with bowel movements, bright red blood in stool and in the bowl.  Denies any previous history of same.  Does not take any blood thinners.  Reports his belly pain is worse with attempting to sit up.  {Add pertinent medical, surgical, social history, OB history to HPI:32947}  Abdominal Pain Associated symptoms: hematochezia   Rectal Bleeding Associated symptoms: abdominal pain        Prior to Admission medications  Medication Sig Start Date End Date Taking? Authorizing Provider  amLODipine  (NORVASC ) 10 MG tablet Take 1 tablet (10 mg total) by mouth daily. 10/08/23   Raspet, Erin K, PA-C  amoxicillin -clavulanate (AUGMENTIN ) 875-125 MG tablet Take 1 tablet by mouth every 12 (twelve) hours. 10/08/23   Raspet, Erin K, PA-C  metoprolol  tartrate (LOPRESSOR ) 25 MG tablet TAKE 1 TABLET BY MOUTH 2 TIMES DAILY 12/22/19 12/21/20  Tysinger, Alm RAMAN, PA-C  ofloxacin  (FLOXIN ) 0.3 % OTIC solution Place 5 drops into the left ear 2 (two) times daily. 10/08/23   Raspet, Erin K, PA-C  traMADol  (ULTRAM ) 50 MG tablet Take 1 tablet (50 mg total) by mouth every 8 (eight) hours as needed. Patient not taking: Reported on 09/10/2018 03/12/18   Stover, Titorya, DPM    Allergies: Patient has no known allergies.    Review of Systems  Gastrointestinal:  Positive for abdominal pain and hematochezia.  All other systems reviewed and are negative.   Updated Vital Signs BP (!) 161/126 (BP Location: Left Arm)   Pulse 82   Temp 98.3 F (36.8 C) (Oral)   Resp 18   Ht 5' 11 (1.803 m)   Wt (!)  140.6 kg   SpO2 100%   BMI 43.24 kg/m   Physical Exam Vitals and nursing note reviewed.  Constitutional:      General: He is not in acute distress.    Appearance: Normal appearance.  HENT:     Head: Normocephalic and atraumatic.  Eyes:     General:        Right eye: No discharge.        Left eye: No discharge.  Cardiovascular:     Rate and Rhythm: Normal rate and regular rhythm.     Heart sounds: No murmur heard.    No friction rub. No gallop.  Pulmonary:     Effort: Pulmonary effort is normal.     Breath sounds: Normal breath sounds.  Abdominal:     General: Bowel sounds are normal.     Palpations: Abdomen is soft.     Comments: Focal ttp in left lower quadrant  Skin:    General: Skin is warm and dry.     Capillary Refill: Capillary refill takes less than 2 seconds.  Neurological:     Mental Status: He is alert and oriented to person, place, and time.  Psychiatric:        Mood and Affect: Mood normal.        Behavior: Behavior normal.     (all labs ordered are listed, but only abnormal results are displayed) Labs Reviewed  COMPREHENSIVE METABOLIC PANEL  WITH GFR - Abnormal; Notable for the following components:      Result Value   AST 52 (*)    All other components within normal limits  CBC - Abnormal; Notable for the following components:   MCV 77.0 (*)    MCH 25.5 (*)    All other components within normal limits  POC OCCULT BLOOD, ED  TYPE AND SCREEN    EKG: None  Radiology: No results found.  {Document cardiac monitor, telemetry assessment procedure when appropriate:32947} Procedures   Medications Ordered in the ED - No data to display    {Click here for ABCD2, HEART and other calculators REFRESH Note before signing:1}                              Medical Decision Making Amount and/or Complexity of Data Reviewed Labs: ordered.   This patient is a 38 y.o. male  who presents to the ED for concern of abdominal pain, rectal bleeding.    Differential diagnoses prior to evaluation: The emergent differential diagnosis includes, but is not limited to,  The causes of generalized abdominal pain include but are not limited to AAA, mesenteric ischemia, appendicitis, diverticulitis, DKA, gastritis, gastroenteritis, AMI, nephrolithiasis, pancreatitis, peritonitis, adrenal insufficiency,lead poisoning, iron toxicity, intestinal ischemia, constipation, UTI,SBO/LBO, splenic rupture, biliary disease, IBD, IBS, PUD, or hepatitis --considered active lower GI bleed, diverticular bleed, hemorrhoids, perforation, versus other. This is not an exhaustive differential.   Past Medical History / Co-morbidities / Social History: ***  Additional history: Chart reviewed. Pertinent results include: ***  Physical Exam: Physical exam performed. The pertinent findings include: ***  Lab Tests/Imaging studies: I personally interpreted labs/imaging and the pertinent results include:  ***. ***I agree with the radiologist interpretation.  Cardiac monitoring: EKG obtained and interpreted by myself and attending physician which shows: ***   Medications: I ordered medication including ***.  I have reviewed the patients home medicines and have made adjustments as needed.   Disposition: After consideration of the diagnostic results and the patients response to treatment, I feel that *** .   ***emergency department workup does not suggest an emergent condition requiring admission or immediate intervention beyond what has been performed at this time. The plan is: ***. The patient is safe for discharge and has been instructed to return immediately for worsening symptoms, change in symptoms or any other concerns.  Final diagnoses:  None    ED Discharge Orders     None        "

## 2024-01-18 NOTE — ED Provider Notes (Signed)
 " [facility] EMERGENCY DEPARTMENT AT [facility] Provider Note   CSN: [id number] Arrival date & time: 01/18/24  1901     Patient presents with: Abdominal Pain and Rectal Bleeding   Victor Jensen is a 38 y.o. male past medical history significant for hypertension, family history of diabetes who presents concern for lower abdominal pain for last night, nausea, rectal bleeding intermittently for 1 week.  Bleeding only with bowel movements, bright red blood in stool and in the bowl.  Denies any previous history of same.  Does not take any blood thinners.  Reports his belly pain is worse with attempting to sit up.    Abdominal Pain Associated symptoms: hematochezia   Rectal Bleeding Associated symptoms: abdominal pain        Prior to Admission medications  Medication Sig Start Date End Date Taking? Authorizing Provider  amLODipine  (NORVASC ) 10 MG tablet Take 1 tablet (10 mg total) by mouth daily. 10/08/23   Raspet, Erin K, PA-C  amoxicillin -clavulanate (AUGMENTIN ) 875-125 MG tablet Take 1 tablet by mouth every 12 (twelve) hours. 10/08/23   Raspet, Erin K, PA-C  metoprolol  tartrate (LOPRESSOR ) 25 MG tablet TAKE 1 TABLET BY MOUTH 2 TIMES DAILY 12/22/19 12/21/20  Tysinger, Alm RAMAN, PA-C  ofloxacin  (FLOXIN ) 0.3 % OTIC solution Place 5 drops into the left ear 2 (two) times daily. 10/08/23   Raspet, Erin K, PA-C  traMADol  (ULTRAM ) 50 MG tablet Take 1 tablet (50 mg total) by mouth every 8 (eight) hours as needed. Patient not taking: Reported on 09/10/2018 03/12/18   Stover, Titorya, DPM    Allergies: Patient has no known allergies.    Review of Systems  Gastrointestinal:  Positive for abdominal pain and hematochezia.  All other systems reviewed and are negative.   Updated Vital Signs BP (!) 152/97 (BP Location: Left Arm)   Pulse 77   Temp 98.3 F (36.8 C) (Oral)   Resp 18   Ht 5' 11 (1.803 m)   Wt (!) 140.6 kg   SpO2 97%   BMI 43.24 kg/m   Physical Exam Vitals and nursing  note reviewed.  Constitutional:      General: He is not in acute distress.    Appearance: Normal appearance.  HENT:     Head: Normocephalic and atraumatic.  Eyes:     General:        Right eye: No discharge.        Left eye: No discharge.  Cardiovascular:     Rate and Rhythm: Normal rate and regular rhythm.     Heart sounds: No murmur heard.    No friction rub. No gallop.  Pulmonary:     Effort: Pulmonary effort is normal.     Breath sounds: Normal breath sounds.  Abdominal:     General: Bowel sounds are normal.     Palpations: Abdomen is soft.     Comments: Focal ttp in left lower quadrant, right lower quadrant, some guarding, no rebound, rigidity.  Skin:    General: Skin is warm and dry.     Capillary Refill: Capillary refill takes less than 2 seconds.  Neurological:     Mental Status: He is alert and oriented to person, place, and time.  Psychiatric:        Mood and Affect: Mood normal.        Behavior: Behavior normal.     (all labs ordered are listed, but only abnormal results are displayed) Labs Reviewed  COMPREHENSIVE METABOLIC PANEL WITH  GFR - Abnormal; Notable for the following components:      Result Value   AST 52 (*)    All other components within normal limits  CBC - Abnormal; Notable for the following components:   MCV 77.0 (*)    MCH 25.5 (*)    All other components within normal limits  POC OCCULT BLOOD, ED  TYPE AND SCREEN    EKG: None  Radiology: CT ABDOMEN PELVIS W CONTRAST Result Date: 01/19/2024 EXAM: CT ABDOMEN AND PELVIS WITH CONTRAST 01/19/2024 12:01:53 AM TECHNIQUE: CT of the abdomen and pelvis was performed with the administration of 100 mL of iohexol  (OMNIPAQUE ) 300 MG/ML solution. Multiplanar reformatted images are provided for review. Automated exposure control, iterative reconstruction, and/or weight-based adjustment of the mA/kV was utilized to reduce the radiation dose to as low as reasonably achievable. COMPARISON: None available.  CLINICAL HISTORY: Lower abdominal pain since last night with nausea, and with rectal bleeding for the past week. FINDINGS: LOWER CHEST: The lung bases are clear with mild elevation of the right hemidiaphragm. The cardiac size is normal. LIVER: The liver is 23 cm in length with mild to moderate steatosis. There is no mass. GALLBLADDER AND BILE DUCTS: Gallbladder is unremarkable. No biliary ductal dilatation. SPLEEN: The spleen is mildly prominent, measuring 14.1 cm AP. 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 There is no mass. PANCREAS: No acute abnormality. ADRENAL GLANDS: No adrenal mass. KIDNEYS, URETERS AND BLADDER: No stones in the kidneys or ureters. No hydronephrosis. No perinephric or periureteral stranding. Urinary bladder is unremarkable. There is no renal mass. 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 GI AND BOWEL: The stomach is contracted without focal abnormality. No evidence of small bowel obstruction or inflammation. There is scattered diverticulosis in the left-sided colon. There is no evidence of acute colitis or diverticulitis. APPENDIX: The proximal and mid appendix is unremarkable but the distal 3 cm of the appendix measures prominent at 9 mm and appears inflamed, with adjacent stranding. The findings are suspicious for a distal appendicitis. The appendix extends medial to the cecum and then turns inferiorly paralleling the right common iliac artery just anterior to it. PERITONEUM AND RETROPERITONEUM: No ascites. No free air. No free hemorrhage, abscess or localizing collections. VASCULATURE: Aorta is normal in caliber. LYMPH NODES: No lymphadenopathy. REPRODUCTIVE ORGANS: No acute abnormality. BONES AND SOFT TISSUES: No acute or significant osseous findings. No focal soft tissue abnormality. IMPRESSION: 1. Findings suspicious for distal appendicitis. 2. No evidence of acute colitis or diverticulitis; left-sided colonic diverticulosis is present. 3. Discussed over the  telephone with Dr. Bari at 12:29 am, 01/19/24, with verbal acknowledgment of findings. Electronically signed by: Francis Quam MD 01/19/2024 12:30 AM EST RP Workstation: [id number]     Procedures   Medications Ordered in the ED  cefTRIAXone  (ROCEPHIN ) 2 g in sodium chloride  0.9 % 100 mL IVPB (2 g Intravenous New Bag/Given 01/19/24 0044)    And  metroNIDAZOLE  (FLAGYL ) IVPB 500 mg (500 mg Intravenous New Bag/Given 01/19/24 0054)  iohexol  (OMNIPAQUE ) 300 MG/ML solution 100 mL (100 mLs Intravenous Contrast Given 01/18/24 2355)                                    Medical Decision Making Amount and/or Complexity of Data Reviewed Labs: ordered.   This patient is a 38 y.o. male  who presents to the ED for concern of abdominal pain, rectal bleeding.  Differential diagnoses prior to evaluation: The emergent differential diagnosis includes, but is not limited to,  The causes of generalized abdominal pain include but are not limited to AAA, mesenteric ischemia, appendicitis, diverticulitis, DKA, gastritis, gastroenteritis, AMI, nephrolithiasis, pancreatitis, peritonitis, adrenal insufficiency,lead poisoning, iron toxicity, intestinal ischemia, constipation, UTI,SBO/LBO, splenic rupture, biliary disease, IBD, IBS, PUD, or hepatitis --considered active lower GI bleed, diverticular bleed, hemorrhoids, perforation, versus other. This is not an exhaustive differential.   Past Medical History / Co-morbidities / Social History: hypertension  Physical Exam: Physical exam performed. The pertinent findings include: Focal ttp in left lower quadrant, right lower quadrant, some guarding, no rebound, rigidity.   Vital signs stable, other than some hypertension, blood pressure 161/126 on arrival, 152/97 on reassessment.  Afebrile  Lab Tests/Imaging studies: I personally interpreted labs/imaging and the pertinent results include: CBC unremarkable, CMP overall unremarkable other than very mildly elevated AST  at 52.  CT abdomen pelvis with contrast shows what appears to be a early distal appendicitis.  No evidence of perforation or abscess. I agree with the radiologist interpretation.  Medications: I ordered medication including Rocephin , Flagyl .  I have reviewed the patients home medicines and have made adjustments as needed.   Consults: I spoke with the surgeon, Dr. Curvin, with plan for surgical evaluation in the morning, hospital admission, I spoke with the hospitalist, Dr. Lee who agrees to admission for acute appendicitis  Disposition: After consideration of the diagnostic results and the patients response to treatment, I feel that patient would benefit from hospital admission, surgical evaluation to treat acute appendicitis.  His rectal bleeding was not present on my exam, suspect either mild diverticular bleed, or hemorrhoids, he is hemodynamically stable with stable hemoglobin in the emergency department..  Final diagnoses:  Acute appendicitis, unspecified acute appendicitis type    ED Discharge Orders     None          Rosan Sherlean DEL, [place] 01/19/24 0117  "

## 2024-01-19 ENCOUNTER — Inpatient Hospital Stay (HOSPITAL_COMMUNITY): Admitting: Anesthesiology

## 2024-01-19 ENCOUNTER — Encounter (HOSPITAL_COMMUNITY)
Admission: EM | Disposition: A | Discharge status: Home / Self Care | Payer: Self-pay | Source: Home / Self Care | Attending: Emergency Medicine

## 2024-01-19 DIAGNOSIS — I10 Essential (primary) hypertension: Secondary | ICD-10-CM | POA: Diagnosis not present

## 2024-01-19 DIAGNOSIS — K625 Hemorrhage of anus and rectum: Secondary | ICD-10-CM

## 2024-01-19 DIAGNOSIS — K3580 Unspecified acute appendicitis: Secondary | ICD-10-CM

## 2024-01-19 DIAGNOSIS — K352 Acute appendicitis with generalized peritonitis, without perforation or abscess: Secondary | ICD-10-CM

## 2024-01-19 DIAGNOSIS — K3589 Other acute appendicitis without perforation or gangrene: Secondary | ICD-10-CM

## 2024-01-19 HISTORY — PX: LAPAROSCOPIC APPENDECTOMY: SHX408

## 2024-01-19 LAB — BASIC METABOLIC PANEL WITH GFR
Anion gap: 12 (ref 5–15)
BUN: 10 mg/dL (ref 6–20)
CO2: 25 mmol/L (ref 22–32)
Calcium: 9.3 mg/dL (ref 8.9–10.3)
Chloride: 103 mmol/L (ref 98–111)
Creatinine, Ser: 1.17 mg/dL (ref 0.61–1.24)
GFR, Estimated: >60 mL/min
Glucose, Bld: 98 mg/dL (ref 70–99)
Potassium: 3.4 mmol/L — ABNORMAL LOW (ref 3.5–5.1)
Sodium: 141 mmol/L (ref 135–145)

## 2024-01-19 LAB — CBC
HCT: 39.5 % (ref 39.0–52.0)
Hemoglobin: 13.3 g/dL (ref 13.0–17.0)
MCH: 25.8 pg — ABNORMAL LOW (ref 26.0–34.0)
MCHC: 33.7 g/dL (ref 30.0–36.0)
MCV: 76.6 fL — ABNORMAL LOW (ref 80.0–100.0)
Platelets: 195 K/uL (ref 150–400)
RBC: 5.16 MIL/uL (ref 4.22–5.81)
RDW: 13.7 % (ref 11.5–15.5)
WBC: 8.1 K/uL (ref 4.0–10.5)
nRBC: 0 % (ref 0.0–0.2)

## 2024-01-19 LAB — TYPE AND SCREEN
ABO/RH(D): O POS
Antibody Screen: NEGATIVE

## 2024-01-19 SURGERY — APPENDECTOMY, LAPAROSCOPIC
Anesthesia: General | Site: Abdomen

## 2024-01-19 MED ORDER — SODIUM CHLORIDE 0.9% FLUSH
3.0000 mL | Freq: Two times a day (BID) | INTRAVENOUS | Status: DC
  Administered 2024-01-19 (×3): 3 mL via INTRAVENOUS

## 2024-01-19 MED ORDER — HYDROMORPHONE HCL 1 MG/ML IJ SOLN
INTRAMUSCULAR | Status: AC
  Filled 2024-01-19: qty 1

## 2024-01-19 MED ORDER — AMLODIPINE BESYLATE 10 MG PO TABS
10.0000 mg | ORAL_TABLET | Freq: Every day | ORAL | Status: DC
  Administered 2024-01-19 – 2024-01-20 (×2): 10 mg via ORAL
  Filled 2024-01-19: qty 2
  Filled 2024-01-19: qty 1

## 2024-01-19 MED ORDER — MIDAZOLAM HCL (PF) 2 MG/2ML IJ SOLN
INTRAMUSCULAR | Status: DC | PRN
  Administered 2024-01-19: 2 mg via INTRAVENOUS

## 2024-01-19 MED ORDER — PROPOFOL 10 MG/ML IV BOLUS
INTRAVENOUS | Status: AC
  Filled 2024-01-19: qty 20

## 2024-01-19 MED ORDER — SUGAMMADEX SODIUM 200 MG/2ML IV SOLN
INTRAVENOUS | Status: DC | PRN
  Administered 2024-01-19: 200 mg via INTRAVENOUS

## 2024-01-19 MED ORDER — LACTATED RINGERS IR SOLN
Status: DC | PRN
  Administered 2024-01-19: 1000 mL

## 2024-01-19 MED ORDER — DROPERIDOL 2.5 MG/ML IJ SOLN
0.6250 mg | Freq: Once | INTRAMUSCULAR | Status: AC | PRN
  Administered 2024-01-19: 0.625 mg via INTRAVENOUS

## 2024-01-19 MED ORDER — ROCURONIUM BROMIDE 10 MG/ML (PF) SYRINGE
PREFILLED_SYRINGE | INTRAVENOUS | Status: DC | PRN
  Administered 2024-01-19: 20 mg via INTRAVENOUS
  Administered 2024-01-19: 50 mg via INTRAVENOUS

## 2024-01-19 MED ORDER — ONDANSETRON HCL 4 MG/2ML IJ SOLN
4.0000 mg | Freq: Four times a day (QID) | INTRAMUSCULAR | Status: DC | PRN
  Administered 2024-01-19: 4 mg via INTRAVENOUS
  Filled 2024-01-19: qty 2

## 2024-01-19 MED ORDER — KETOROLAC TROMETHAMINE 30 MG/ML IJ SOLN
INTRAMUSCULAR | Status: AC
  Filled 2024-01-19: qty 1

## 2024-01-19 MED ORDER — SODIUM CHLORIDE 0.9% FLUSH: 3.0000 mL | INTRAVENOUS | Status: DC | PRN

## 2024-01-19 MED ORDER — PROPOFOL 10 MG/ML IV BOLUS
INTRAVENOUS | Status: DC | PRN
  Administered 2024-01-19: 260 mg via INTRAVENOUS

## 2024-01-19 MED ORDER — BUPIVACAINE-EPINEPHRINE 0.5% -1:200000 IJ SOLN
INTRAMUSCULAR | Status: DC | PRN
  Administered 2024-01-19: 15 mL

## 2024-01-19 MED ORDER — KETOROLAC TROMETHAMINE 30 MG/ML IJ SOLN
30.0000 mg | Freq: Once | INTRAMUSCULAR | Status: AC
  Administered 2024-01-19: 30 mg via INTRAVENOUS

## 2024-01-19 MED ORDER — METRONIDAZOLE 500 MG/100ML IV SOLN
500.0000 mg | Freq: Once | INTRAVENOUS | Status: AC
  Administered 2024-01-19: 500 mg via INTRAVENOUS
  Filled 2024-01-19: qty 100

## 2024-01-19 MED ORDER — FENTANYL CITRATE (PF) 100 MCG/2ML IJ SOLN
INTRAMUSCULAR | Status: AC
  Filled 2024-01-19: qty 2

## 2024-01-19 MED ORDER — LACTATED RINGERS IV SOLN: INTRAVENOUS | Status: DC | PRN

## 2024-01-19 MED ORDER — DEXAMETHASONE SOD PHOSPHATE PF 10 MG/ML IJ SOLN
INTRAMUSCULAR | Status: DC | PRN
  Administered 2024-01-19: 10 mg via INTRAVENOUS

## 2024-01-19 MED ORDER — SODIUM CHLORIDE 0.9 % IV SOLN
2.0000 g | Freq: Once | INTRAVENOUS | Status: AC
  Administered 2024-01-19: 2 g via INTRAVENOUS
  Filled 2024-01-19: qty 20

## 2024-01-19 MED ORDER — DEXTROSE IN LACTATED RINGERS 5 % IV SOLN: INTRAVENOUS | Status: DC

## 2024-01-19 MED ORDER — PHENYLEPHRINE 80 MCG/ML (10ML) SYRINGE FOR IV PUSH (FOR BLOOD PRESSURE SUPPORT)
PREFILLED_SYRINGE | INTRAVENOUS | Status: DC | PRN
  Administered 2024-01-19: 80 ug via INTRAVENOUS

## 2024-01-19 MED ORDER — LIDOCAINE 2% (20 MG/ML) 5 ML SYRINGE
INTRAMUSCULAR | Status: DC | PRN
  Administered 2024-01-19: 100 mg via INTRAVENOUS

## 2024-01-19 MED ORDER — BUPIVACAINE-EPINEPHRINE (PF) 0.25% -1:200000 IJ SOLN
INTRAMUSCULAR | Status: AC
  Filled 2024-01-19: qty 30

## 2024-01-19 MED ORDER — FENTANYL CITRATE (PF) 250 MCG/5ML IJ SOLN
INTRAMUSCULAR | Status: DC | PRN
  Administered 2024-01-19 (×4): 50 ug via INTRAVENOUS

## 2024-01-19 MED ORDER — PIPERACILLIN-TAZOBACTAM 3.375 G IVPB
3.3750 g | Freq: Three times a day (TID) | INTRAVENOUS | Status: DC
  Administered 2024-01-19: 3.375 g via INTRAVENOUS
  Filled 2024-01-19: qty 50

## 2024-01-19 MED ORDER — ACETAMINOPHEN 10 MG/ML IV SOLN
INTRAVENOUS | Status: AC
  Filled 2024-01-19: qty 100

## 2024-01-19 MED ORDER — MIDAZOLAM HCL 2 MG/2ML IJ SOLN
INTRAMUSCULAR | Status: AC
  Filled 2024-01-19: qty 2

## 2024-01-19 MED ORDER — ONDANSETRON HCL 4 MG/2ML IJ SOLN
INTRAMUSCULAR | Status: DC | PRN
  Administered 2024-01-19: 4 mg via INTRAVENOUS

## 2024-01-19 MED ORDER — ONDANSETRON HCL 4 MG PO TABS: 4.0000 mg | ORAL_TABLET | Freq: Four times a day (QID) | ORAL | Status: DC | PRN

## 2024-01-19 MED ORDER — HYDROMORPHONE HCL 1 MG/ML IJ SOLN
0.2500 mg | INTRAMUSCULAR | Status: DC | PRN
  Administered 2024-01-19 (×2): 0.5 mg via INTRAVENOUS

## 2024-01-19 MED ORDER — ACETAMINOPHEN 325 MG PO TABS: 650.0000 mg | ORAL_TABLET | Freq: Four times a day (QID) | ORAL | Status: DC | PRN

## 2024-01-19 MED ORDER — SODIUM CHLORIDE 0.9 % IV SOLN: 250.0000 mL | INTRAVENOUS | Status: DC | PRN

## 2024-01-19 MED ORDER — DROPERIDOL 2.5 MG/ML IJ SOLN
INTRAMUSCULAR | Status: AC
  Filled 2024-01-19: qty 2

## 2024-01-19 MED ORDER — OXYCODONE HCL 5 MG PO TABS
5.0000 mg | ORAL_TABLET | ORAL | Status: DC | PRN
  Administered 2024-01-19 – 2024-01-20 (×3): 5 mg via ORAL
  Filled 2024-01-19 (×3): qty 1

## 2024-01-19 MED ORDER — POTASSIUM CHLORIDE 10 MEQ/100ML IV SOLN
10.0000 meq | INTRAVENOUS | Status: AC
  Administered 2024-01-19 (×4): 10 meq via INTRAVENOUS
  Filled 2024-01-19 (×4): qty 100

## 2024-01-19 MED ORDER — MORPHINE SULFATE (PF) 2 MG/ML IV SOLN
1.0000 mg | INTRAVENOUS | Status: DC | PRN
  Administered 2024-01-19: 1 mg via INTRAVENOUS
  Filled 2024-01-19: qty 1

## 2024-01-19 MED ORDER — ACETAMINOPHEN 650 MG RE SUPP: 650.0000 mg | Freq: Four times a day (QID) | RECTAL | Status: DC | PRN

## 2024-01-19 MED ORDER — 0.9 % SODIUM CHLORIDE (POUR BTL) OPTIME
TOPICAL | Status: DC | PRN
  Administered 2024-01-19: 1000 mL

## 2024-01-19 SURGICAL SUPPLY — 36 items
BAG COUNTER SPONGE SURGICOUNT (BAG) IMPLANT
CHLORAPREP W/TINT 26 (MISCELLANEOUS) ×1 IMPLANT
CLIP APPLIE 5 13 M/L LIGAMAX5 (MISCELLANEOUS) IMPLANT
CLIP APPLIE ROT 10 11.4 M/L (STAPLE) IMPLANT
DERMABOND ADVANCED .7 DNX12 (GAUZE/BANDAGES/DRESSINGS) ×1 IMPLANT
ELECT REM PT RETURN 15FT ADLT (MISCELLANEOUS) ×1 IMPLANT
ENDOLOOP SUT PDS II 0 18 (SUTURE) IMPLANT
GLOVE BIO SURGEON STRL SZ 6.5 (GLOVE) ×1 IMPLANT
GLOVE INDICATOR 6.5 STRL GRN (GLOVE) ×1 IMPLANT
GOWN STRL REUS W/ TWL XL LVL3 (GOWN DISPOSABLE) ×1 IMPLANT
GRASPER SUT TROCAR 14GX15 (MISCELLANEOUS) IMPLANT
IRRIGATION SUCT STRKRFLW 2 WTP (MISCELLANEOUS) ×1 IMPLANT
KIT BASIN OR (CUSTOM PROCEDURE TRAY) ×1 IMPLANT
KIT TURNOVER KIT A (KITS) ×1 IMPLANT
NDL INSUFFLATION 14GA 120MM (NEEDLE) IMPLANT
NEEDLE INSUFFLATION 14GA 120MM (NEEDLE) IMPLANT
PENCIL SMOKE EVACUATOR (MISCELLANEOUS) IMPLANT
RELOAD STAPLE 60 2.6 WHT THN (STAPLE) IMPLANT
RELOAD STAPLE 60 3.6 BLU REG (STAPLE) IMPLANT
SCISSORS LAP 5X35 DISP (ENDOMECHANICALS) IMPLANT
SET TUBE SMOKE EVAC HIGH FLOW (TUBING) ×1 IMPLANT
SHEARS HARMONIC 36 ACE (MISCELLANEOUS) IMPLANT
SLEEVE ADV FIXATION 5X100MM (TROCAR) ×1 IMPLANT
SPIKE FLUID TRANSFER (MISCELLANEOUS) ×1 IMPLANT
STAPLE ECHEON FLEX 60 POW ENDO (STAPLE) ×1 IMPLANT
SUT VIC AB 2-0 SH 27X BRD (SUTURE) IMPLANT
SUT VIC AB 4-0 PS2 27 (SUTURE) ×1 IMPLANT
SUT VICRYL 0 TIES 12 18 (SUTURE) IMPLANT
SUT VICRYL 0 UR6 27IN ABS (SUTURE) ×1 IMPLANT
SYSTEM BAG RETRIEVAL 10MM (BASKET) ×1 IMPLANT
TOWEL OR DSP ST BLU DLX 10/PK (DISPOSABLE) ×1 IMPLANT
TRAY FOLEY MTR SLVR 16FR STAT (SET/KITS/TRAYS/PACK) ×1 IMPLANT
TRAY LAPAROSCOPIC (CUSTOM PROCEDURE TRAY) ×1 IMPLANT
TROCAR ADV FIXATION 5X100MM (TROCAR) ×1 IMPLANT
TROCAR BALLN 12MMX100 BLUNT (TROCAR) ×1 IMPLANT
TROCAR Z THREAD OPTICAL 12X100 (TROCAR) IMPLANT

## 2024-01-19 NOTE — Plan of Care (Signed)
   Problem: Health Behavior/Discharge Planning: Goal: Ability to manage health-related needs will improve Outcome: Progressing   Problem: Clinical Measurements: Goal: Ability to maintain clinical measurements within normal limits will improve Outcome: Progressing Goal: Will remain free from infection Outcome: Progressing

## 2024-01-19 NOTE — Anesthesia Procedure Notes (Signed)
 Procedure Name: Intubation Date/Time: 01/19/2024 2:06 PM  Performed by: Areatha Glendia HERO, CRNAPre-anesthesia Checklist: Patient identified, Emergency Drugs available, Suction available and Patient being monitored Patient Re-evaluated:Patient Re-evaluated prior to induction Oxygen Delivery Method: Circle System Utilized Preoxygenation: Pre-oxygenation with 100% oxygen Induction Type: IV induction Ventilation: Mask ventilation without difficulty Laryngoscope Size: Mac and 4 Grade View: Grade II Tube type: Oral Tube size: 7.5 mm Number of attempts: 1 Airway Equipment and Method: Stylet and Oral airway Placement Confirmation: ETT inserted through vocal cords under direct vision, positive ETCO2 and breath sounds checked- equal and bilateral Secured at: 23 cm Tube secured with: Tape Dental Injury: Teeth and Oropharynx as per pre-operative assessment

## 2024-01-19 NOTE — Anesthesia Preprocedure Evaluation (Addendum)
"                                    Anesthesia Evaluation  Patient identified by MRN, date of birth, ID band Patient awake    Reviewed: Allergy & Precautions, NPO status , Patient's Chart, lab work & pertinent test results  Airway Mallampati: II  TM Distance: >3 FB Neck ROM: Full    Dental no notable dental hx. (+) Dental Advisory Given, Teeth Intact   Pulmonary shortness of breath and with exertion, former smoker   Pulmonary exam normal breath sounds clear to auscultation       Cardiovascular METS: 5 - 7 Mets hypertension, Pt. on medications (-) angina Normal cardiovascular exam Rhythm:Regular Rate:Normal  Echo 2019 - Left ventricle: The cavity size was normal. Wall thickness was increased  in a pattern of mild LVH. Systolic function was vigorous. The estimated  ejection fraction was in the range of 65% to 70%. Wall motion was normal;  there were no regional wall motion abnormalities. There was no evidence  of elevated ventricular filling pressure by Doppler parameters.  - Left atrium: The atrium was mildly dilated.  - Right ventricle: The cavity size was mildly dilated. Wall thickness was normal.  - Right atrium: The atrium was mildly dilated.  - Tricuspid valve: There was trivial regurgitation.    Cardiac MRI 2017 1. Mildly dilated left ventricle with moderate concentric hypertrophy and  normal systolic function (LVEF = 56%) with no regional wall motion  abnormalities. There is no late gadolinium enhancement in the left  ventricle myocardium.   2. Normal right ventricular size with mild right ventricular hypertrophy  and normal systolic function (RVEF = 56%) with no regional wall motion abnormalities.   3.  Moderate left and right atrial dilatation.   4.  Mild mitral and tricuspid regurgitation. Collectively, these findings are consistent with hypertensive heart disease.    Neuro/Psych negative neurological ROS     GI/Hepatic negative GI ROS, Neg  liver ROS,,,  Endo/Other    Class 3 obesity  Renal/GU negative Renal ROS     Musculoskeletal negative musculoskeletal ROS (+)    Abdominal  (+) + obese  Peds  Hematology negative hematology ROS (+)   Anesthesia Other Findings   Reproductive/Obstetrics                              Anesthesia Physical Anesthesia Plan  ASA: 3  Anesthesia Plan: General   Post-op Pain Management: Tylenol  PO (pre-op)*   Induction: Intravenous and Rapid sequence  PONV Risk Score and Plan: 4 or greater and Ondansetron , Dexamethasone  and Treatment may vary due to age or medical condition  Airway Management Planned: Oral ETT  Additional Equipment: None  Intra-op Plan:   Post-operative Plan: Extubation in OR  Informed Consent: I have reviewed the patients History and Physical, chart, labs and discussed the procedure including the risks, benefits and alternatives for the proposed anesthesia with the patient or authorized representative who has indicated his/her understanding and acceptance.     Dental advisory given  Plan Discussed with: CRNA  Anesthesia Plan Comments:          Anesthesia Quick Evaluation  "

## 2024-01-19 NOTE — H&P (Addendum)
 " History and Physical    [name] FMW:[id number] DOB: 1986/01/09 DOA: 01/18/2024  PCP: Patient, No Pcp Per   Patient coming from: Home   Chief Complaint:  Chief Complaint  Patient presents with   Abdominal Pain   Rectal Bleeding   ED TRIAGE note:  Pt arrives w/ c/o lower abd pain since last night, nausea, and rectal bleeding x 1 week. Reports bright red blood in stool.     HPI:  Victor Jensen is a 38 y.o. male with medical history significant of chronic knee joint pain secondary from previous knee injury and essential hypertension presented emergency department complaining of abdominal pain associate nausea, vomiting last 24 hours and rectal bleeding for 1 week.  Patient reported rectal bleeding only with bowel movement, bright red blood in the stool and in the bowel.  Denies any previous episode in the past. Patient complaining about abdominal pain which gets worse with changing position and sitting up.  Patient denies fever and chill.  Denies any chest pain, shortness of breath and palpitation.  No other complaint at this time.  ED Course:  At presentation to ED patient found borderline hypertensive otherwise hemodynamically stable.  Afebrile. Lab, CBC unremarkable normal WBC count H&H and platelet count. CMP unremarkable except elevated AST 52.  Pending FOBT.  CT abdomen pelvis findings suspicious for distal appendicitis.No evidence of acute colitis or diverticulitis; left-sided colonic diverticulosis is present.  In the ED patient has been treated with IV ceftriaxone  and metronidazole . EDP consulted tried to reach out general surgery on-call Dr. Curvin and waiting for response.  EDP perform bedside rectal exam and there is no evidence of blood in the stool or in the rectal vault.  Patient is hemodynamically stable.  Hospitalist consulted for further evaluation management of for distal appendicitis and rectal bleeding.   Significant labs in the ED: Lab Orders          Comprehensive metabolic panel         CBC         CBC         Basic metabolic panel         HIV Antibody (routine testing w rflx)         POC occult blood, ED       Review of Systems:  Review of Systems  Constitutional:  Negative for chills, fever and malaise/fatigue.  Respiratory:  Negative for cough and sputum production.   Cardiovascular:  Negative for chest pain and palpitations.  Gastrointestinal:  Positive for abdominal pain, blood in stool and nausea. Negative for constipation, diarrhea, heartburn, melena and vomiting.  Musculoskeletal:  Negative for myalgias.  Neurological:  Negative for dizziness.  Psychiatric/Behavioral:  The patient is not nervous/anxious.     Past Medical History:  Diagnosis Date   Allergic rhinitis    Allergy    Elevated uric acid in blood    Family history of premature CAD    father, died MI in early 9s   H/O cardiovascular stress test 01/2015   LVH, EF 65-70%, but other imaging advised, Dr. Lavona   History of MRI of chest    cardiac MRI.  Concentric LVH, mildly dilated LV. Dr. Lavona   History of sleep disturbance 04/2015   equivocal sleep study 04/2015   Hypertension    Obesity     Past Surgical History:  Procedure Laterality Date   NO PAST SURGERIES  09/2018   None       reports that  he has quit smoking. His smoking use included cigarettes. He has a 1.5 pack-year smoking history. He has never used smokeless tobacco. He reports current alcohol use. He reports that he does not currently use drugs after having used the following drugs: Marijuana.  Allergies[1]  Family History  Problem Relation Age of Onset   Heart attack Father 39   Diabetes Father        Severe   Heart disease Father 60   Diabetes Paternal Grandfather    Diabetes Maternal Grandfather    Heart disease Maternal Grandfather    Cancer Other    Diabetes Maternal Grandmother     Prior to Admission medications  Medication Sig Start Date End Date Taking?  Authorizing Provider  amLODipine  (NORVASC ) 10 MG tablet Take 1 tablet (10 mg total) by mouth daily. 10/08/23   Raspet, Erin K, PA-C  amoxicillin -clavulanate (AUGMENTIN ) 875-125 MG tablet Take 1 tablet by mouth every 12 (twelve) hours. 10/08/23   Raspet, Erin K, PA-C  metoprolol  tartrate (LOPRESSOR ) 25 MG tablet TAKE 1 TABLET BY MOUTH 2 TIMES DAILY 12/22/19 12/21/20  Tysinger, Alm RAMAN, PA-C  ofloxacin  (FLOXIN ) 0.3 % OTIC solution Place 5 drops into the left ear 2 (two) times daily. 10/08/23   Raspet, Erin K, PA-C  traMADol  (ULTRAM ) 50 MG tablet Take 1 tablet (50 mg total) by mouth every 8 (eight) hours as needed. Patient not taking: Reported on 09/10/2018 03/12/18   Burt Fus, DPM     Physical Exam: Vitals:   01/18/24 2343 01/19/24 0300 01/19/24 0334 01/19/24 0630  BP: (!) 152/97  (!) 167/107 139/81  Pulse: 77 70 73 66  Resp: 18  18 18   Temp: 98.3 F (36.8 C)  97.9 F (36.6 C) 98 F (36.7 C)  TempSrc: Oral  Oral   SpO2: 97% 95% 96% 92%  Weight:      Height:        Physical Exam Vitals and nursing note reviewed.  Constitutional:      General: He is not in acute distress.    Appearance: He is not ill-appearing.  Cardiovascular:     Rate and Rhythm: Normal rate and regular rhythm.  Abdominal:     General: Abdomen is flat. Bowel sounds are normal. There is no distension.     Palpations: Abdomen is soft.     Tenderness: There is abdominal tenderness in the right lower quadrant. There is no guarding or rebound.  Skin:    Capillary Refill: Capillary refill takes less than 2 seconds.  Neurological:     Mental Status: He is alert and oriented to person, place, and time.      Labs on Admission: I have personally reviewed following labs and imaging studies  CBC: Recent Labs  Lab 01/18/24 1943 01/19/24 0441  WBC 9.1 8.1  HGB 14.2 13.3  HCT 42.8 39.5  MCV 77.0* 76.6*  PLT 212 195   Basic Metabolic Panel: Recent Labs  Lab 01/18/24 1943 01/19/24 0441  NA 141 141  K 3.8  3.4*  CL 103 103  CO2 28 25  GLUCOSE 89 98  BUN 11 10  CREATININE 1.09 1.17  CALCIUM 9.5 9.3   GFR: Estimated Creatinine Clearance: 120.4 mL/min (by C-G formula based on SCr of 1.17 mg/dL). Liver Function Tests: Recent Labs  Lab 01/18/24 1943  AST 52*  ALT 27  ALKPHOS 87  BILITOT 0.6  PROT 8.0  ALBUMIN 4.7   No results for input(s): LIPASE, AMYLASE in the last 168 hours. No  results for input(s): AMMONIA in the last 168 hours. Coagulation Profile: No results for input(s): INR, PROTIME in the last 168 hours. Cardiac Enzymes: No results for input(s): CKTOTAL, CKMB, CKMBINDEX, TROPONINI, TROPONINIHS in the last 168 hours. BNP (last 3 results) No results for input(s): BNP in the last 8760 hours. HbA1C: No results for input(s): HGBA1C in the last 72 hours. CBG: No results for input(s): GLUCAP in the last 168 hours. Lipid Profile: No results for input(s): CHOL, HDL, LDLCALC, TRIG, CHOLHDL, LDLDIRECT in the last 72 hours. Thyroid  Function Tests: No results for input(s): TSH, T4TOTAL, FREET4, T3FREE, THYROIDAB in the last 72 hours. Anemia Panel: No results for input(s): VITAMINB12, FOLATE, FERRITIN, TIBC, IRON, RETICCTPCT in the last 72 hours. Urine analysis:    Component Value Date/Time   BILIRUBINUR negative 07/10/2017 0927   BILIRUBINUR neg 07/23/2015 0945   KETONESUR negative 07/10/2017 0927   PROTEINUR negative 07/10/2017 0927   PROTEINUR neg 07/23/2015 0945   UROBILINOGEN negative 07/23/2015 0945   NITRITE Negative 07/10/2017 0927   NITRITE neg 07/23/2015 0945   LEUKOCYTESUR Negative 07/10/2017 0927    Radiological Exams on Admission: I have personally reviewed images CT ABDOMEN PELVIS W CONTRAST Result Date: 01/19/2024 EXAM: CT ABDOMEN AND PELVIS WITH CONTRAST 01/19/2024 12:01:53 AM TECHNIQUE: CT of the abdomen and pelvis was performed with the administration of 100 mL of iohexol  (OMNIPAQUE ) 300 MG/ML  solution. Multiplanar reformatted images are provided for review. Automated exposure control, iterative reconstruction, and/or weight-based adjustment of the mA/kV was utilized to reduce the radiation dose to as low as reasonably achievable. COMPARISON: None available. CLINICAL HISTORY: Lower abdominal pain since last night with nausea, and with rectal bleeding for the past week. FINDINGS: LOWER CHEST: The lung bases are clear with mild elevation of the right hemidiaphragm. The cardiac size is normal. LIVER: The liver is 23 cm in length with mild to moderate steatosis. There is no mass. GALLBLADDER AND BILE DUCTS: Gallbladder is unremarkable. No biliary ductal dilatation. SPLEEN: The spleen is mildly prominent, measuring 14.1 cm AP. 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 There is no mass. PANCREAS: No acute abnormality. ADRENAL GLANDS: No adrenal mass. KIDNEYS, URETERS AND BLADDER: No stones in the kidneys or ureters. No hydronephrosis. No perinephric or periureteral stranding. Urinary bladder is unremarkable. There is no renal mass. 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 GI AND BOWEL: The stomach is contracted without focal abnormality. No evidence of small bowel obstruction or inflammation. There is scattered diverticulosis in the left-sided colon. There is no evidence of acute colitis or diverticulitis. APPENDIX: The proximal and mid appendix is unremarkable but the distal 3 cm of the appendix measures prominent at 9 mm and appears inflamed, with adjacent stranding. The findings are suspicious for a distal appendicitis. The appendix extends medial to the cecum and then turns inferiorly paralleling the right common iliac artery just anterior to it. PERITONEUM AND RETROPERITONEUM: No ascites. No free air. No free hemorrhage, abscess or localizing collections. VASCULATURE: Aorta is normal in caliber. LYMPH NODES: No lymphadenopathy. REPRODUCTIVE ORGANS: No acute abnormality. BONES AND SOFT  TISSUES: No acute or significant osseous findings. No focal soft tissue abnormality. IMPRESSION: 1. Findings suspicious for distal appendicitis. 2. No evidence of acute colitis or diverticulitis; left-sided colonic diverticulosis is present. 3. Discussed over the telephone with Dr. Bari at 12:29 am, 01/19/24, with verbal acknowledgment of findings. Electronically signed by: Francis Quam MD 01/19/2024 12:30 AM EST RP Workstation: [id number]      Assessment/Plan: Principal  Problem:   Acute appendicitis Active Problems:   Concern for rectal bleeding   Essential hypertension, benign    Assessment and Plan: Acute distal appendicitis Concern for rectal bleeding -Patient present emergency department complaining of abdominal pain, nausea vomiting for 24 hours.  Patient also reported right great rectal bleeding with bowel movement for 1 week denies any fever and chill.  At presentation to ED patient is hemodynamically stable.  Afebrile.  CBC dominance of leukocytosis -CT abdomen pelvis showed suspicious for distal appendicitis.  No evidence of colitis or diverticulitis.  Left-sided chronic diverticulosis. -EDP consulted general surgery plan for evaluate patient in the morning. -In the ED patient received IV ceftriaxone  and metronidazole . - Continue IV Zosyn  with pharmacy consult. - Continue maintenance point D5 LR 75 cc/h.  Keep patient n.p.o. -Continue pain control with Tylenol , Dilaudid  and morphine . -Continue serial abdominal exam every 4 hours to monitor development of any acute abdominal sign.   Essential hypertension -Continue amlodipine .   Concern for rectal bleeding - Patient reported bright red blood per rectum only with bowel movement.  Denies any rectal pain.  EDP performed rectal exam no evidence of hemorrhoid and no evidence of blood in the rectal vault. Normal H&H.  Pending FOBT. -Continue monitor development of melena.   DVT prophylaxis:  SCDs Code Status:  Full  Code Diet: Currently n.p.o. in the setting of acute appendicitis. Family Communication:   Family was present at bedside, at the time of interview. Opportunity was given to ask question and all questions were answered satisfactorily.  Disposition Plan: Continue to monitor development of acute abdominal sign.  Pending general surgery recommendations. Consults: General surgery Admission status:   Inpatient, Telemetry bed  Severity of Illness: The appropriate patient status for this patient is INPATIENT. Inpatient status is judged to be reasonable and necessary in order to provide the required intensity of service to ensure the patient's safety. The patient's presenting symptoms, physical exam findings, and initial radiographic and laboratory data in the context of their chronic comorbidities is felt to place them at high risk for further clinical deterioration. Furthermore, it is not anticipated that the patient will be medically stable for discharge from the hospital within 2 midnights of admission.   * I certify that at the point of admission it is my clinical judgment that the patient will require inpatient hospital care spanning beyond 2 midnights from the point of admission due to high intensity of service, high risk for further deterioration and high frequency of surveillance required.DEWAINE    Alwyn Cordner, MD [facility]  How to contact the [facility] Attending or Consulting provider 7A - 7P or covering provider during after hours 7P -7A, for this patient.  Check the care team in [facility] and look for a) attending/consulting [facility] provider listed and b) the [facility] team listed Log into [email or web address] and use [place]'s universal password to access. If you do not have the password, please contact the hospital operator. Locate the [facility] provider you are looking for under [facility] and page to a number that you can be directly reached. If you still have difficulty reaching the provider, please  page the [facility] (Director on Call) for the Hospitalists listed on amion for assistance.  01/19/2024, 6:45 AM           [1] No Known Allergies  "

## 2024-01-19 NOTE — Transfer of Care (Signed)
 Immediate Anesthesia Transfer of Care Note  Patient: Victor Jensen  Procedure(s) Performed: APPENDECTOMY, LAPAROSCOPIC (Abdomen)  Patient Location: PACU  Anesthesia Type:General  Level of Consciousness: awake and alert   Airway & Oxygen Therapy: Patient Spontanous Breathing and Patient connected to nasal cannula oxygen  Post-op Assessment: Report given to RN and Post -op Vital signs reviewed and stable  Post vital signs: Reviewed and stable  Last Vitals:  Vitals Value Taken Time  BP 146/84 01/19/24 15:20  Temp 97.1   Pulse 71 01/19/24 15:23  Resp 16 01/19/24 15:23  SpO2 100 % 01/19/24 15:23  Vitals shown include unfiled device data.  Last Pain:  Vitals:   01/19/24 1250  TempSrc:   PainSc: 6          Complications: No notable events documented.

## 2024-01-19 NOTE — Hospital Course (Signed)
 Victor Jensen

## 2024-01-19 NOTE — Op Note (Signed)
 Victor Jensen [id number]   PRE-OPERATIVE DIAGNOSIS:  APPENDICITIS  POST-OPERATIVE DIAGNOSIS:  ACUTE APPENDICITIS   Procedures: APPENDECTOMY, LAPAROSCOPIC    Surgeon(s): Debby Hila, MD  ASSISTANT: none   ANESTHESIA:   local and general  EBL:   20 ml  Delay start of Pharmacological VTE agent (>24hrs) due to surgical blood loss or risk of bleeding:  no  DRAINS: none   SPECIMEN:  Source of Specimen:  appendix  DISPOSITION OF SPECIMEN:  PATHOLOGY  COUNTS:  YES  PLAN OF CARE: Patient already admitted  PATIENT DISPOSITION:  PACU - hemodynamically stable.   INDICATIONS: Patient with concerning symptoms & work up suspicious for appendicitis.  Surgery was recommended:  The anatomy & physiology of the digestive tract was discussed.  The pathophysiology of appendicitis was discussed.  Natural history risks without surgery was discussed.   I feel the risks of no intervention will lead to serious problems that outweigh the operative risks; therefore, I recommended diagnostic laparoscopy with removal of appendix to remove the pathology.  Laparoscopic & open techniques were discussed.   I noted a good likelihood this will help address the problem.    Risks such as bleeding, infection, abscess, leak, reoperation, possible ostomy, hernia, heart attack, death, and other risks were discussed.  Goals of post-operative recovery were discussed as well.  We will work to minimize complications.  Questions were answered.  The patient expresses understanding & wishes to proceed with surgery.  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 The patient was positioned supine with left arm tucked. SCDs were active during the entire case. The patient underwent general anesthesia without any difficulty.  A foley catheter was inserted under sterile conditions. The abdomen  was prepped and draped in a sterile fashion. A Surgical Timeout confirmed our plan.   I made a transverse incision through the superior umbilical fold. I dissected down to the fascia but was unable to safely elevate this due to the thickness of the abdominal wall. I decided to use a Varies needle in the LUQ.  The patient was placed in reverse trendelenburg with left side up.  Entry was clean.  The abdomen was insufflated. A 5mm port was placed in the LLQ.  Camera inspection revealed no injury at either insertion site.  A 12mm port was placed through the midline incision under laparoscopic visualization.  I placed an additional port under direct laparoscopic visualization in the suprapubic area.  I mobilized the terminal ileum to proximal ascending colon in a lateral to medial fashion.  I took care to avoid injuring any retroperitoneal structures.  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  These were carefully lysed with laparoscopic scissors.  I freed the appendix off its attachments to the ascending colon and cecal mesentery.  I elevated the appendix from the retroperitoneum.  I was able to free off the base of the appendix, which was still viable.  I stapled the appendix off the cecum using a laparoscopic blue load stapler.  I took a healthy cuff viable cecum. I skeletonized & ligated the mesoappendix with a harmonic scalpel.  I placed the appendix inside an EndoCatch bag and removed out the 12mm port.  I did copious irrigation. Hemostasis was good in the mesoappendix, colon mesentery, and retroperitoneum. Staple line was intact on the cecum with a small area of  bleeding.  This was controlled with careful application if the harmonic scalpel. I washed out the pelvis, retrohepatic space and right paracolic gutter.  Hemostasis is good. There was no perforation or injury.  Because the area cleaned up well after irrigation, I did not  place a drain.  I aspirated the carbon dioxide. I removed the ports. I closed the umbilical fascia site using a 0 Vicryl stitch and a laparoscopic closure device.  I closed the subcutaneous tissue with a 2-0 Vicryl suture. I closed skin using 4-0 vicryl stitch.  Sterile dressings were applied.  Patient was extubated and sent to the recovery room.  I discussed the operative findings with the patient's family. I suspect the patient is going used in the hospital at least overnight and will need antibiotics for 0 more days. Questions answered. They expressed understanding and appreciation.   Victor JAYSON Ned, MD  Colorectal and General Surgery [facility] Surgery

## 2024-01-19 NOTE — Discharge Instructions (Signed)
LAPAROSCOPIC SURGERY: POST OP INSTRUCTIONS  DIET: Follow a light bland diet the first 24 hours after arrival home, such as soup, liquids, crackers, etc.  Be sure to include lots of fluids daily.  Avoid fast food or heavy meals as your are more likely to get nauseated.  Eat a low fat the next few days after surgery.   Take your usually prescribed home medications unless otherwise directed. PAIN CONTROL: Pain is best controlled by a usual combination of three different methods TOGETHER: Ice/Heat Over the counter pain medication Prescription pain medication Most patients will experience some swelling and bruising around the incisions.  Ice packs or heating pads (30-60 minutes up to 6 times a day) will help. Use ice for the first few days to help decrease swelling and bruising, then switch to heat to help relax tight/sore spots and speed recovery.  Some people prefer to use ice alone, heat alone, alternating between ice & heat.  Experiment to what works for you.  Swelling and bruising can take several weeks to resolve.   It is helpful to take an over-the-counter pain medication regularly for the first few weeks.  Choose one of the following that works best for you: Naproxen (Aleve, etc)  Two 220mg tabs twice a day Ibuprofen (Advil, etc) Three 200mg tabs four times a day (every meal & bedtime) A  prescription for pain medication (such as percocet, vicodin, oxycodone, hydrocodone, etc) should be given to you upon discharge.  Take your pain medication as prescribed.  If you are having problems/concerns with the prescription medicine (does not control pain, nausea, vomiting, rash, itching, etc), please call us [phone] to see if we need to switch you to a different pain medicine that will work better for you and/or control your side effect better. If you need a refill on your pain medication, please contact your pharmacy.  They will contact our office to request authorization. Prescriptions will not be  filled after 5 pm or on week-ends.   Avoid getting constipated.  Between the surgery and the pain medications, it is common to experience some constipation.  Increasing fluid intake and taking a fiber supplement (such as Metamucil, Citrucel, FiberCon, MiraLax, etc) 1-2 times a day regularly will usually help prevent this problem from occurring.  A mild laxative (prune juice, Milk of Magnesia, MiraLax, etc) should be taken according to package directions if there are no bowel movements after 48 hours.   Watch out for diarrhea.  If you have many loose bowel movements, simplify your diet to bland foods & liquids for a few days.  Stop any stool softeners and decrease your fiber supplement.  Switching to mild anti-diarrheal medications (Kayopectate, Pepto Bismol) can help.  If this worsens or does not improve, please call us. Wash / shower every day.  You may shower over the dressings as they are waterproof.  Continue to shower over incision(s) after the dressing is off. Remove your waterproof bandages 5 days after surgery.  You may leave the incision open to air.  You may replace a dressing/Band-Aid to cover the incision for comfort if you wish.  ACTIVITIES as tolerated:   You may resume regular (light) daily activities beginning the next day--such as daily self-care, walking, climbing stairs--gradually increasing activities as tolerated.  If you can walk 30 minutes without difficulty, it is safe to try more intense activity such as jogging, treadmill, bicycling, low-impact aerobics, swimming, etc. Save the most intensive and strenuous activity for last such as sit-ups, heavy   lifting, contact sports, etc  Refrain from any heavy lifting or straining until you are off narcotics for pain control.   DO NOT PUSH THROUGH PAIN.  Let pain be your guide: If it hurts to do something, don't do it.  Pain is your body warning you to avoid that activity for another week until the pain goes down. You may drive when you are  no longer taking prescription pain medication, you can comfortably wear a seatbelt, and you can safely maneuver your car and apply brakes. You may have sexual intercourse when it is comfortable.  FOLLOW UP in our office Please call [facility] at [phone] to set up an appointment to see your surgeon in the office for a follow-up appointment approximately 2-3 weeks after your surgery. Make sure that you call for this appointment the day you arrive home to insure a convenient appointment time. 10. IF YOU HAVE DISABILITY OR FAMILY LEAVE FORMS, BRING THEM TO THE OFFICE FOR PROCESSING.  DO NOT GIVE THEM TO YOUR DOCTOR.   WHEN TO CALL US [phone]: Poor pain control Reactions / problems with new medications (rash/itching, nausea, etc)  Fever over 101.5 F (38.5 C) Inability to urinate Nausea and/or vomiting Worsening swelling or bruising Continued bleeding from incision. Increased pain, redness, or drainage from the incision   The clinic staff is available to answer your questions during regular business hours (8:30am-5pm).  Please don't hesitate to call and ask to speak to one of our nurses for clinical concerns.   If you have a medical emergency, go to the nearest emergency room or call 911.  A surgeon from [facility] is always on call at the hospitals   [facility] [address], [address], [place], [place]  [address] ? MAIN: [phone] ? TOLL FREE: [phone] ?  FAX [phone] [email or web address]   

## 2024-01-19 NOTE — Anesthesia Postprocedure Evaluation (Signed)
"   Anesthesia Post Note  Patient: [place] Jensen  Procedure(s) Performed: APPENDECTOMY, LAPAROSCOPIC (Abdomen)     Patient location during evaluation: PACU Anesthesia Type: General Level of consciousness: sedated and patient cooperative Pain management: pain level controlled Vital Signs Assessment: post-procedure vital signs reviewed and stable Respiratory status: spontaneous breathing Cardiovascular status: stable Anesthetic complications: no   No notable events documented.  Last Vitals:  Vitals:   01/19/24 1520 01/19/24 1525  BP: (!) 146/84   Pulse:  66  Resp:  13  Temp: 37 C   SpO2: 99% 98%    Last Pain:  Vitals:   01/19/24 1250  TempSrc:   PainSc: 6                  Cheryl Stabenow      "

## 2024-01-19 NOTE — Consult Note (Signed)
 "   CC: abd pain  Requesting provider: Dr Verdene  HPI: [place] Jensen is an 38 y.o. male who is here for lower abd pain that started ~ 2 days ago.  He has noted some nausea, anorexia, constipation and rectal bleeding as well.  CT concerning for tip appendicitis.  IV antibiotics given.    Past Medical History:  Diagnosis Date   Allergic rhinitis    Allergy    Elevated uric acid in blood    Family history of premature CAD    father, died MI in early 74s   H/O cardiovascular stress test 01/2015   LVH, EF 65-70%, but other imaging advised, Dr. Lavona   History of MRI of chest    cardiac MRI.  Concentric LVH, mildly dilated LV. Dr. Lavona   History of sleep disturbance 04/2015   equivocal sleep study 04/2015   Hypertension    Obesity     Past Surgical History:  Procedure Laterality Date   NO PAST SURGERIES  09/2018   None      Family History  Problem Relation Age of Onset   Heart attack Father 63   Diabetes Father        Severe   Heart disease Father 12   Diabetes Paternal Grandfather    Diabetes Maternal Grandfather    Heart disease Maternal Grandfather    Cancer Other    Diabetes Maternal Grandmother     Social:  reports that he has quit smoking. His smoking use included cigarettes. He has a 1.5 pack-year smoking history. He has never used smokeless tobacco. He reports current alcohol use. He reports that he does not currently use drugs after having used the following drugs: Marijuana.  Allergies: Allergies[1]  Medications: I have reviewed the patient's current medications.  Results for orders placed or performed during the hospital encounter of 01/18/24 (from the past 48 hours)  Type and screen [facility]     Status: None   Collection Time: 01/18/24  7:40 PM  Result Value Ref Range   ABO/RH(D) O POS    Antibody Screen NEG    Sample Expiration      01/21/2024,2359 Performed at [facility], [address],  [place], [place] [address]   Comprehensive metabolic panel     Status: Abnormal   Collection Time: 01/18/24  7:43 PM  Result Value Ref Range   Sodium 141 135 - 145 mmol/L   Potassium 3.8 3.5 - 5.1 mmol/L   Chloride 103 98 - 111 mmol/L   CO2 28 22 - 32 mmol/L   Glucose, Bld 89 70 - 99 mg/dL    Comment: Glucose reference range applies only to samples taken after fasting for at least 8 hours.   BUN 11 6 - 20 mg/dL   Creatinine, Ser 8.90 0.61 - 1.24 mg/dL   Calcium 9.5 8.9 - 89.6 mg/dL   Total Protein 8.0 6.5 - 8.1 g/dL   Albumin 4.7 3.5 - 5.0 g/dL   AST 52 (H) 15 - 41 U/L   ALT 27 0 - 44 U/L   Alkaline Phosphatase 87 38 - 126 U/L   Total Bilirubin 0.6 0.0 - 1.2 mg/dL   GFR, Estimated >39 >39 mL/min    Comment: (NOTE) Calculated using the CKD-EPI Creatinine Equation (2021)    Anion gap 10 5 - 15    Comment: Performed at [facility], [address] [place], [place] [address]  CBC  Status: Abnormal   Collection Time: 01/18/24  7:43 PM  Result Value Ref Range   WBC 9.1 4.0 - 10.5 K/uL   RBC 5.56 4.22 - 5.81 MIL/uL   Hemoglobin 14.2 13.0 - 17.0 g/dL   HCT 57.1 60.9 - 47.9 %   MCV 77.0 (L) 80.0 - 100.0 fL   MCH 25.5 (L) 26.0 - 34.0 pg   MCHC 33.2 30.0 - 36.0 g/dL   RDW 86.2 88.4 - 84.4 %   Platelets 212 150 - 400 K/uL   nRBC 0.0 0.0 - 0.2 %    Comment: Performed at [facility], [address] [place], [place] [address]  CBC     Status: Abnormal   Collection Time: 01/19/24  4:41 AM  Result Value Ref Range   WBC 8.1 4.0 - 10.5 K/uL   RBC 5.16 4.22 - 5.81 MIL/uL   Hemoglobin 13.3 13.0 - 17.0 g/dL   HCT 60.4 60.9 - 47.9 %   MCV 76.6 (L) 80.0 - 100.0 fL   MCH 25.8 (L) 26.0 - 34.0 pg   MCHC 33.7 30.0 - 36.0 g/dL   RDW 86.2 88.4 - 84.4 %   Platelets 195 150 - 400 K/uL   nRBC 0.0 0.0 - 0.2 %    Comment: Performed at [facility], [address], [place], [place] [address]  Basic metabolic panel     Status: Abnormal    Collection Time: 01/19/24  4:41 AM  Result Value Ref Range   Sodium 141 135 - 145 mmol/L   Potassium 3.4 (L) 3.5 - 5.1 mmol/L   Chloride 103 98 - 111 mmol/L   CO2 25 22 - 32 mmol/L   Glucose, Bld 98 70 - 99 mg/dL    Comment: Glucose reference range applies only to samples taken after fasting for at least 8 hours.   BUN 10 6 - 20 mg/dL   Creatinine, Ser 8.82 0.61 - 1.24 mg/dL   Calcium 9.3 8.9 - 89.6 mg/dL   GFR, Estimated >39 >39 mL/min    Comment: (NOTE) Calculated using the CKD-EPI Creatinine Equation (2021)    Anion gap 12 5 - 15    Comment: Performed at [facility], [address], [place], [place] [address]    CT ABDOMEN PELVIS W CONTRAST Result Date: 01/19/2024 EXAM: CT ABDOMEN AND PELVIS WITH CONTRAST 01/19/2024 12:01:53 AM TECHNIQUE: CT of the abdomen and pelvis was performed with the administration of 100 mL of iohexol  (OMNIPAQUE ) 300 MG/ML solution. Multiplanar reformatted images are provided for review. Automated exposure control, iterative reconstruction, and/or weight-based adjustment of the mA/kV was utilized to reduce the radiation dose to as low as reasonably achievable. COMPARISON: None available. CLINICAL HISTORY: Lower abdominal pain since last night with nausea, and with rectal bleeding for the past week. FINDINGS: LOWER CHEST: The lung bases are clear with mild elevation of the right hemidiaphragm. The cardiac size is normal. LIVER: The liver is 23 cm in length with mild to moderate steatosis. There is no mass. GALLBLADDER AND BILE DUCTS: Gallbladder is unremarkable. No biliary ductal dilatation. SPLEEN: The spleen is mildly prominent, measuring 14.1 cm AP. 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 There is no mass. PANCREAS: No acute abnormality. ADRENAL GLANDS: No adrenal mass. KIDNEYS, URETERS AND BLADDER: No stones in the kidneys or ureters. No hydronephrosis. No perinephric or periureteral stranding. Urinary bladder is unremarkable. There  is no renal mass. 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 GI  AND BOWEL: The stomach is contracted without focal abnormality. No evidence of small bowel obstruction or inflammation. There is scattered diverticulosis in the left-sided colon. There is no evidence of acute colitis or diverticulitis. APPENDIX: The proximal and mid appendix is unremarkable but the distal 3 cm of the appendix measures prominent at 9 mm and appears inflamed, with adjacent stranding. The findings are suspicious for a distal appendicitis. The appendix extends medial to the cecum and then turns inferiorly paralleling the right common iliac artery just anterior to it. PERITONEUM AND RETROPERITONEUM: No ascites. No free air. No free hemorrhage, abscess or localizing collections. VASCULATURE: Aorta is normal in caliber. LYMPH NODES: No lymphadenopathy. REPRODUCTIVE ORGANS: No acute abnormality. BONES AND SOFT TISSUES: No acute or significant osseous findings. No focal soft tissue abnormality. IMPRESSION: 1. Findings suspicious for distal appendicitis. 2. No evidence of acute colitis or diverticulitis; left-sided colonic diverticulosis is present. 3. Discussed over the telephone with Dr. Bari at 12:29 am, 01/19/24, with verbal acknowledgment of findings. Electronically signed by: Francis Quam MD 01/19/2024 12:30 AM EST RP Workstation: [id number]    ROS - all of the below systems have been reviewed with the patient and positives are indicated with bold text General: chills, fever or night sweats Eyes: blurry vision or double vision ENT: epistaxis or sore throat Hematologic/Lymphatic: bleeding problems, blood clots or swollen lymph nodes Endocrine: temperature intolerance or unexpected weight changes Breast: new or changing breast lumps or nipple discharge Resp: cough, shortness of breath, or wheezing CV: chest pain or dyspnea on exertion GI: as per HPI GU: dysuria, trouble voiding, or hematuria Neuro: TIA or stroke  symptoms    PE Blood pressure 139/81, pulse 66, temperature 98 F (36.7 C), resp. rate 18, height 5' 11 (1.803 m), weight (!) 140.6 kg, SpO2 92%. Constitutional: NAD; conversant; no deformities Eyes: Moist conjunctiva; no lid lag; anicteric; PERRL Neck: Trachea midline; no thyromegaly Lungs: Normal respiratory effort CV: RRR GI: Abd TTP RLQ MSK: Normal range of motion of extremities; no clubbing/cyanosis Psychiatric: Appropriate affect; alert and oriented x3  Results for orders placed or performed during the hospital encounter of 01/18/24 (from the past 48 hours)  Type and screen [facility]     Status: None   Collection Time: 01/18/24  7:40 PM  Result Value Ref Range   ABO/RH(D) O POS    Antibody Screen NEG    Sample Expiration      01/21/2024,2359 Performed at [facility], [address], [place], [place] [address]   Comprehensive metabolic panel     Status: Abnormal   Collection Time: 01/18/24  7:43 PM  Result Value Ref Range   Sodium 141 135 - 145 mmol/L   Potassium 3.8 3.5 - 5.1 mmol/L   Chloride 103 98 - 111 mmol/L   CO2 28 22 - 32 mmol/L   Glucose, Bld 89 70 - 99 mg/dL    Comment: Glucose reference range applies only to samples taken after fasting for at least 8 hours.   BUN 11 6 - 20 mg/dL   Creatinine, Ser 8.90 0.61 - 1.24 mg/dL   Calcium 9.5 8.9 - 89.6 mg/dL   Total Protein 8.0 6.5 - 8.1 g/dL   Albumin 4.7 3.5 - 5.0 g/dL   AST 52 (H) 15 - 41 U/L   ALT 27 0 - 44 U/L   Alkaline Phosphatase 87 38 - 126 U/L   Total Bilirubin 0.6 0.0 - 1.2 mg/dL   GFR, Estimated >39 >39 mL/min  Comment: (NOTE) Calculated using the CKD-EPI Creatinine Equation (2021)    Anion gap 10 5 - 15    Comment: Performed at [facility], [address] [place], [place] [address]  CBC     Status: Abnormal   Collection Time: 01/18/24  7:43 PM  Result Value Ref Range   WBC 9.1 4.0 - 10.5 K/uL   RBC 5.56 4.22 - 5.81 MIL/uL    Hemoglobin 14.2 13.0 - 17.0 g/dL   HCT 57.1 60.9 - 47.9 %   MCV 77.0 (L) 80.0 - 100.0 fL   MCH 25.5 (L) 26.0 - 34.0 pg   MCHC 33.2 30.0 - 36.0 g/dL   RDW 86.2 88.4 - 84.4 %   Platelets 212 150 - 400 K/uL   nRBC 0.0 0.0 - 0.2 %    Comment: Performed at [facility], [address] [place], [place] [address]  CBC     Status: Abnormal   Collection Time: 01/19/24  4:41 AM  Result Value Ref Range   WBC 8.1 4.0 - 10.5 K/uL   RBC 5.16 4.22 - 5.81 MIL/uL   Hemoglobin 13.3 13.0 - 17.0 g/dL   HCT 60.4 60.9 - 47.9 %   MCV 76.6 (L) 80.0 - 100.0 fL   MCH 25.8 (L) 26.0 - 34.0 pg   MCHC 33.7 30.0 - 36.0 g/dL   RDW 86.2 88.4 - 84.4 %   Platelets 195 150 - 400 K/uL   nRBC 0.0 0.0 - 0.2 %    Comment: Performed at [facility], [address] [place], [place] [address]  Basic metabolic panel     Status: Abnormal   Collection Time: 01/19/24  4:41 AM  Result Value Ref Range   Sodium 141 135 - 145 mmol/L   Potassium 3.4 (L) 3.5 - 5.1 mmol/L   Chloride 103 98 - 111 mmol/L   CO2 25 22 - 32 mmol/L   Glucose, Bld 98 70 - 99 mg/dL    Comment: Glucose reference range applies only to samples taken after fasting for at least 8 hours.   BUN 10 6 - 20 mg/dL   Creatinine, Ser 8.82 0.61 - 1.24 mg/dL   Calcium 9.3 8.9 - 89.6 mg/dL   GFR, Estimated >39 >39 mL/min    Comment: (NOTE) Calculated using the CKD-EPI Creatinine Equation (2021)    Anion gap 12 5 - 15    Comment: Performed at [facility], [address], [place], [place] [address]    CT ABDOMEN PELVIS W CONTRAST Result Date: 01/19/2024 EXAM: CT ABDOMEN AND PELVIS WITH CONTRAST 01/19/2024 12:01:53 AM TECHNIQUE: CT of the abdomen and pelvis was performed with the administration of 100 mL of iohexol  (OMNIPAQUE ) 300 MG/ML solution. Multiplanar reformatted images are provided for review. Automated exposure control, iterative reconstruction, and/or weight-based adjustment of the mA/kV was  utilized to reduce the radiation dose to as low as reasonably achievable. COMPARISON: None available. CLINICAL HISTORY: Lower abdominal pain since last night with nausea, and with rectal bleeding for the past week. FINDINGS: LOWER CHEST: The lung bases are clear with mild elevation of the right hemidiaphragm. The cardiac size is normal. LIVER: The liver is 23 cm in length with mild to moderate steatosis. There is no mass. GALLBLADDER AND BILE DUCTS: Gallbladder is unremarkable. No biliary ductal dilatation. SPLEEN: The spleen is mildly prominent, measuring 14.1 cm AP. 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 There is no mass. PANCREAS: No acute abnormality.  ADRENAL GLANDS: No adrenal mass. KIDNEYS, URETERS AND BLADDER: No stones in the kidneys or ureters. No hydronephrosis. No perinephric or periureteral stranding. Urinary bladder is unremarkable. There is no renal mass. 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 GI AND BOWEL: The stomach is contracted without focal abnormality. No evidence of small bowel obstruction or inflammation. There is scattered diverticulosis in the left-sided colon. There is no evidence of acute colitis or diverticulitis. APPENDIX: The proximal and mid appendix is unremarkable but the distal 3 cm of the appendix measures prominent at 9 mm and appears inflamed, with adjacent stranding. The findings are suspicious for a distal appendicitis. The appendix extends medial to the cecum and then turns inferiorly paralleling the right common iliac artery just anterior to it. PERITONEUM AND RETROPERITONEUM: No ascites. No free air. No free hemorrhage, abscess or localizing collections. VASCULATURE: Aorta is normal in caliber. LYMPH NODES: No lymphadenopathy. REPRODUCTIVE ORGANS: No acute abnormality. BONES AND SOFT TISSUES: No acute or significant osseous findings. No focal soft tissue abnormality. IMPRESSION: 1. Findings suspicious for distal appendicitis. 2. No evidence of acute  colitis or diverticulitis; left-sided colonic diverticulosis is present. 3. Discussed over the telephone with Dr. Bari at 12:29 am, 01/19/24, with verbal acknowledgment of findings. Electronically signed by: Francis Quam MD 01/19/2024 12:30 AM EST RP Workstation: [id number]     A/P: Saber Dickerman Rimmer is an 38 y.o. male with signs and symptoms consistent with appendicitis.  I have recommended lap appendectomy.  Risks such as bleeding, infection, leak of surgical staple line and other infections were discussed in detail.  All questions were answered.  Pt agrees to proceed.    [name], MD  Colorectal and General Surgery [facility]    moderate decision making.     [1] No Known Allergies  "

## 2024-01-19 NOTE — Progress Notes (Signed)
 Patient admitted earlier this morning.  H&P reviewed.  Patient seen and examined.  Patient complains of 8 out of 10 pain in the lower abdomen.  Some nausea and dry heaves.  He mentions that he has been seeing some blood in his stool and on his toilet paper over the last 1 week.  He reports a significant history of constipation and strains a lot while having bowel movement.  Vital signs reviewed.  Elevated blood pressure noted.  Lungs are clear to auscultation bilaterally. S1-S2 is normal regular.  No S3-S4. Abdomen is soft.  Exquisitely tender in the right lower quadrant with guarding and some rebound.  No rigidity appreciated.  Bowel sounds absent.  No masses organomegaly.  Potassium noted to be 3.4 this morning.  WBC is normal.  Hemoglobin is 13.3.  CT scan report reviewed.  Patient with symptoms and signs concerning for acute appendicitis.  General surgery input is pending.  Leave n.p.o. for now.  Rectal bleeding has been mentioned.  Etiology not clear.  He does give a history significant for straining and constipation.  He is noted to have diverticula on CT scan which could be the reason for bleeding.  He could have internal hemorrhoids.  Hemoglobin is stable though.  Symptoms have been ongoing for at least a week.  This will need to be addressed in the outpatient setting.  He will benefit from a good bowel regimen after the surgery.  Elevated blood pressure likely due to pain.  He states that he is supposed to be on amlodipine  but ran out of it recently.  Will order intravenous KCl supplementation.  Discussed with the patient and his wife as well as his mother.  Will continue to monitor.  Joette Pebbles 01/19/2024

## 2024-01-20 ENCOUNTER — Encounter (HOSPITAL_COMMUNITY): Payer: Self-pay | Admitting: General Surgery

## 2024-01-20 ENCOUNTER — Other Ambulatory Visit (HOSPITAL_COMMUNITY): Payer: Self-pay

## 2024-01-20 LAB — HIV ANTIBODY (ROUTINE TESTING W REFLEX): HIV Screen 4th Generation wRfx: NONREACTIVE

## 2024-01-20 LAB — CBC
HCT: 38.7 % — ABNORMAL LOW (ref 39.0–52.0)
Hemoglobin: 12.9 g/dL — ABNORMAL LOW (ref 13.0–17.0)
MCH: 25.4 pg — ABNORMAL LOW (ref 26.0–34.0)
MCHC: 33.3 g/dL (ref 30.0–36.0)
MCV: 76.2 fL — ABNORMAL LOW (ref 80.0–100.0)
Platelets: 222 K/uL (ref 150–400)
RBC: 5.08 MIL/uL (ref 4.22–5.81)
RDW: 13.6 % (ref 11.5–15.5)
WBC: 10.5 K/uL (ref 4.0–10.5)
nRBC: 0 % (ref 0.0–0.2)

## 2024-01-20 LAB — BASIC METABOLIC PANEL WITH GFR
Anion gap: 11 (ref 5–15)
BUN: 9 mg/dL (ref 6–20)
CO2: 25 mmol/L (ref 22–32)
Calcium: 9.5 mg/dL (ref 8.9–10.3)
Chloride: 103 mmol/L (ref 98–111)
Creatinine, Ser: 1.21 mg/dL (ref 0.61–1.24)
GFR, Estimated: >60 mL/min
Glucose, Bld: 129 mg/dL — ABNORMAL HIGH (ref 70–99)
Potassium: 4.1 mmol/L (ref 3.5–5.1)
Sodium: 139 mmol/L (ref 135–145)

## 2024-01-20 LAB — MAGNESIUM: Magnesium: 2.1 mg/dL (ref 1.7–2.4)

## 2024-01-20 MED ORDER — SIMETHICONE 80 MG PO CHEW
80.0000 mg | CHEWABLE_TABLET | Freq: Four times a day (QID) | ORAL | Status: DC | PRN
  Administered 2024-01-20: 80 mg via ORAL
  Filled 2024-01-20: qty 1

## 2024-01-20 MED ORDER — OXYCODONE HCL 5 MG PO TABS
5.0000 mg | ORAL_TABLET | ORAL | 0 refills | Status: DC | PRN
  Filled 2024-01-20: qty 15, 3d supply, fill #0

## 2024-01-20 NOTE — Plan of Care (Signed)

## 2024-01-20 NOTE — Discharge Summary (Signed)
 Physician Discharge Summary  Patient ID: Victor Jensen MRN: [id number] DOB/AGE: 1985-06-24 38 y.o.  Admit date: 01/18/2024 Discharge date: 01/20/2024  Admission Diagnoses: Acute appendicitis  Discharge Diagnoses:  Principal Problem:   Acute appendicitis Active Problems:   Essential hypertension, benign   Concern for rectal bleeding   Discharged Condition: good  Hospital Course: Patient was admitted to the med surg floor after surgery.  Diet was advanced as tolerated.   By postop day 1, he was tolerating a solid diet and pain was controlled with oral medications.  He was urinating without difficulty and ambulating without assistance.  Patient was felt to be in stable condition for discharge to home.  For his rectal bleeding and hypertension it was recommended that he obtain a PCP and f/u with GI as an outpatient   Consults: None  Significant Diagnostic Studies: labs: cbc, bmet.  CT Abd/pelvis  Treatments: IV hydration, antibiotics: ceftriaxone , analgesia: acetaminophen , and surgery: laparoscopic appendectomy  Discharge Exam: Blood pressure 127/75, pulse 77, temperature 98.5 F (36.9 C), temperature source Oral, resp. rate 18, height 5' 11 (1.803 m), weight (!) 140.6 kg, SpO2 99%. General appearance: alert and cooperative GI: soft, mild distention  Incision/Wound: clean, dry, intact  Disposition: Discharge disposition: 01-Home or Self Care        Allergies as of 01/20/2024   No Known Allergies      Medication List     STOP taking these medications    amoxicillin -clavulanate 875-125 MG tablet Commonly known as: AUGMENTIN    traMADol  50 MG tablet Commonly known as: ULTRAM        TAKE these medications    amLODipine  10 MG tablet Commonly known as: NORVASC  Take 1 tablet (10 mg total) by mouth daily.   metoprolol  tartrate 25 MG tablet Commonly known as: LOPRESSOR  TAKE 1 TABLET BY MOUTH 2 TIMES DAILY   ofloxacin  0.3 % OTIC solution Commonly known  as: FLOXIN  Place 5 drops into the left ear 2 (two) times daily.   oxyCODONE  5 MG immediate release tablet Commonly known as: Oxy IR/ROXICODONE  Take 1 tablet (5 mg total) by mouth every 4 (four) hours as needed for moderate pain (pain score 4-6), severe pain (pain score 7-10) or breakthrough pain.        Follow-up Information     [facility], [place]. Schedule an appointment as soon as possible for a visit in 2 week(s).   Specialty: General Surgery Contact information: [address] [place]  [address] [phone]                Signed: [name] 01/20/2024, 8:21 AM

## 2024-01-20 NOTE — TOC Initial Note (Signed)
 Transition of Care [facility]) - Initial/Assessment Note    Patient Details  Name: Victor Jensen MRN: [id number] Date of Birth: 1985-06-26  [facility] CM/SW Contact:    Sonda Manuella Quill, RN Phone Number: 01/20/2024, 10:28 AM  Clinical Narrative:                 No PCP listed; spoke w/ pt and spouse Terri Darsey [phone]) in room; pt said he lives at home w/ his spouse; he plans to return w/ her support at d/c; family will provide transportation; insurance verified; pt said he does not have PCP; he denied SDOH risks; pt does not have DME, HH services, or home oxygen; he declined receiving resource for [facility] PCPs; pt said he will contact his insurance for list of in-network providers; he will make his own appt w/ provider of choice; no IP CM needs.  Expected Discharge Plan: Home/Self Care Barriers to Discharge: No Barriers Identified   Patient Goals and CMS Choice Patient states their goals for this hospitalization and ongoing recovery are:: home          Expected Discharge Plan and Services   Discharge Planning Services: CM Consult   Living arrangements for the past 2 months: Single Family Home Expected Discharge Date: 01/20/24               DME Arranged: N/A DME Agency: NA       HH Arranged: NA HH Agency: NA        Prior Living Arrangements/Services Living arrangements for the past 2 months: Single Family Home Lives with:: Spouse Patient language and need for interpreter reviewed:: Yes Do you feel safe going back to the place where you live?: Yes      Need for Family Participation in Patient Care: Yes (Comment) Care giver support system in place?: Yes (comment) Current home services:  (n/a) Criminal Activity/Legal Involvement Pertinent to Current Situation/Hospitalization: No - Comment as needed  Activities of Daily Living   ADL Screening (condition at time of admission) Independently performs ADLs?: Yes (appropriate for developmental  age) Is the patient deaf or have difficulty hearing?: No Does the patient have difficulty seeing, even when wearing glasses/contacts?: No Does the patient have difficulty concentrating, remembering, or making decisions?: No  Permission Sought/Granted Permission sought to share information with : Case Manager Permission granted to share information with : Yes, Verbal Permission Granted  Share Information with NAME: Case Manager     Permission granted to share info w Relationship: Terri Sitzmann (spouse) [phone]     Emotional Assessment Appearance:: Appears stated age Attitude/Demeanor/Rapport: Gracious Affect (typically observed): Accepting Orientation: : Oriented to Self, Oriented to Place, Oriented to  Time, Oriented to Situation Alcohol / Substance Use: Not Applicable Psych Involvement: No (comment)  Admission diagnosis:  Acute appendicitis [K35.80] Acute appendicitis, unspecified acute appendicitis type [K35.80] Patient Active Problem List   Diagnosis Date Noted   Acute appendicitis 01/19/2024   Concern for rectal bleeding 01/19/2024   Acute pain of left knee 09/10/2018   Injury of left knee 09/10/2018   Right foot pain 03/12/2018   Acute right ankle pain 03/12/2018   Left foot pain 01/14/2018   Vaccine counseling 07/10/2017   Plantar fasciitis 07/10/2017   Acne 07/10/2017   Essential hypertension, benign 05/29/2017   Encounter for health maintenance examination in adult 07/23/2015   Elevated uric acid in blood 07/23/2015   Impaired fasting blood sugar 07/23/2015   Obesity 07/23/2015   LVH (left ventricular hypertrophy) 07/23/2015  Family history of premature CAD 07/23/2015   Family history of diabetes mellitus 07/23/2015   PCP:  Patient, No Pcp Per Pharmacy:   [facility] - [facility] [address] [place] [address] Phone: [phone] Fax: [phone]  [place] [facility] [address] [place] [address] Phone: [phone] Fax: [phone]     Social Drivers of Health (SDOH) Social History: SDOH Screenings   Food Insecurity: No Food Insecurity (01/20/2024)  Housing: Low Risk (01/20/2024)  Transportation Needs: No Transportation Needs (01/20/2024)  Utilities: Not At Risk (01/20/2024)  Tobacco Use: Medium Risk (01/18/2024)   SDOH Interventions: Food Insecurity Interventions: Intervention Not Indicated, Inpatient TOC Housing Interventions: Intervention Not Indicated, Inpatient TOC Transportation Interventions: Intervention Not Indicated, Inpatient TOC Utilities Interventions: Intervention Not Indicated, Inpatient TOC   Readmission Risk Interventions     No data to display

## 2024-01-21 ENCOUNTER — Encounter: Payer: Self-pay | Admitting: Gastroenterology

## 2024-01-24 LAB — SURGICAL PATHOLOGY

## 2024-01-28 ENCOUNTER — Other Ambulatory Visit (HOSPITAL_COMMUNITY): Payer: Self-pay

## 2024-01-28 MED ORDER — TRAMADOL HCL 50 MG PO TABS
50.0000 mg | ORAL_TABLET | Freq: Four times a day (QID) | ORAL | 0 refills | Status: DC | PRN
  Filled 2024-01-28: qty 20, 5d supply, fill #0

## 2024-02-07 ENCOUNTER — Other Ambulatory Visit (HOSPITAL_COMMUNITY): Payer: Self-pay

## 2024-02-26 ENCOUNTER — Ambulatory Visit: Admitting: Medical

## 2024-02-26 ENCOUNTER — Encounter: Payer: Self-pay | Admitting: Medical

## 2024-02-26 ENCOUNTER — Other Ambulatory Visit: Payer: Self-pay

## 2024-02-26 ENCOUNTER — Other Ambulatory Visit (HOSPITAL_COMMUNITY): Payer: Self-pay

## 2024-02-26 ENCOUNTER — Ambulatory Visit: Admitting: Gastroenterology

## 2024-02-26 ENCOUNTER — Other Ambulatory Visit

## 2024-02-26 ENCOUNTER — Encounter: Payer: Self-pay | Admitting: Gastroenterology

## 2024-02-26 VITALS — BP 160/120 | HR 84 | Ht 71.0 in | Wt 308.6 lb

## 2024-02-26 VITALS — BP 132/70 | HR 81 | Ht 71.0 in | Wt 307.0 lb

## 2024-02-26 DIAGNOSIS — R7301 Impaired fasting glucose: Secondary | ICD-10-CM

## 2024-02-26 DIAGNOSIS — R7401 Elevation of levels of liver transaminase levels: Secondary | ICD-10-CM

## 2024-02-26 DIAGNOSIS — R195 Other fecal abnormalities: Secondary | ICD-10-CM

## 2024-02-26 DIAGNOSIS — I10 Essential (primary) hypertension: Secondary | ICD-10-CM

## 2024-02-26 DIAGNOSIS — K921 Melena: Secondary | ICD-10-CM | POA: Insufficient documentation

## 2024-02-26 DIAGNOSIS — E790 Hyperuricemia without signs of inflammatory arthritis and tophaceous disease: Secondary | ICD-10-CM

## 2024-02-26 DIAGNOSIS — Z9049 Acquired absence of other specified parts of digestive tract: Secondary | ICD-10-CM | POA: Diagnosis not present

## 2024-02-26 DIAGNOSIS — K760 Fatty (change of) liver, not elsewhere classified: Secondary | ICD-10-CM

## 2024-02-26 DIAGNOSIS — R1030 Lower abdominal pain, unspecified: Secondary | ICD-10-CM | POA: Diagnosis not present

## 2024-02-26 DIAGNOSIS — R109 Unspecified abdominal pain: Secondary | ICD-10-CM

## 2024-02-26 DIAGNOSIS — R351 Nocturia: Secondary | ICD-10-CM | POA: Diagnosis not present

## 2024-02-26 DIAGNOSIS — R194 Change in bowel habit: Secondary | ICD-10-CM

## 2024-02-26 LAB — HEPATIC FUNCTION PANEL
ALT: 23 U/L (ref 3–53)
AST: 34 U/L (ref 5–37)
Albumin: 4.6 g/dL (ref 3.5–5.2)
Alkaline Phosphatase: 66 U/L (ref 39–117)
Bilirubin, Direct: 0.1 mg/dL (ref 0.1–0.3)
Total Bilirubin: 0.5 mg/dL (ref 0.2–1.2)
Total Protein: 7.3 g/dL (ref 6.0–8.3)

## 2024-02-26 MED ORDER — VALSARTAN 40 MG PO TABS
40.0000 mg | ORAL_TABLET | Freq: Every day | ORAL | 2 refills | Status: AC
  Filled 2024-02-26: qty 30, 30d supply, fill #0

## 2024-02-26 MED ORDER — AMLODIPINE BESYLATE 5 MG PO TABS
5.0000 mg | ORAL_TABLET | Freq: Every day | ORAL | 2 refills | Status: AC
  Filled 2024-02-26: qty 30, 30d supply, fill #0

## 2024-02-26 MED ORDER — NA SULFATE-K SULFATE-MG SULF 17.5-3.13-1.6 GM/177ML PO SOLN
1.0000 | ORAL | 0 refills | Status: AC
  Filled 2024-02-26: qty 354, 1d supply, fill #0
  Filled 2024-02-27: qty 354, 2d supply, fill #0

## 2024-02-26 NOTE — Progress Notes (Signed)
 SABRA

## 2024-02-27 ENCOUNTER — Other Ambulatory Visit (HOSPITAL_COMMUNITY): Payer: Self-pay

## 2024-02-27 ENCOUNTER — Ambulatory Visit: Payer: Self-pay | Admitting: Gastroenterology

## 2024-03-25 ENCOUNTER — Encounter: Admitting: Gastroenterology

## 2024-03-26 ENCOUNTER — Ambulatory Visit: Admitting: Medical
